# Patient Record
Sex: Female | Born: 1985 | Race: Black or African American | Hispanic: No | Marital: Single | Smoking: Never smoker
Health system: Southern US, Community
[De-identification: ages and names within clinical notes are randomized; demographics above are authoritative.]

## PROBLEM LIST (undated history)

## (undated) ENCOUNTER — Inpatient Hospital Stay: Payer: Self-pay

## (undated) ENCOUNTER — Inpatient Hospital Stay (HOSPITAL_COMMUNITY): Payer: Self-pay

## (undated) DIAGNOSIS — D649 Anemia, unspecified: Secondary | ICD-10-CM

## (undated) DIAGNOSIS — M419 Scoliosis, unspecified: Secondary | ICD-10-CM

## (undated) DIAGNOSIS — R011 Cardiac murmur, unspecified: Secondary | ICD-10-CM

## (undated) DIAGNOSIS — J45909 Unspecified asthma, uncomplicated: Secondary | ICD-10-CM

## (undated) HISTORY — PX: WISDOM TOOTH EXTRACTION: SHX21

## (undated) HISTORY — PX: DILATION AND CURETTAGE OF UTERUS: SHX78

---
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
## 2012-07-11 ENCOUNTER — Encounter (HOSPITAL_COMMUNITY): Payer: Self-pay | Admitting: *Deleted

## 2012-07-11 ENCOUNTER — Emergency Department (HOSPITAL_COMMUNITY)
Admission: EM | Admit: 2012-07-11 | Discharge: 2012-07-12 | Disposition: A | Discharge status: Home / Self Care | Payer: Medicaid Other | Attending: Emergency Medicine | Admitting: Emergency Medicine

## 2012-07-11 DIAGNOSIS — R1310 Dysphagia, unspecified: Secondary | ICD-10-CM | POA: Insufficient documentation

## 2012-07-11 DIAGNOSIS — J029 Acute pharyngitis, unspecified: Secondary | ICD-10-CM | POA: Insufficient documentation

## 2012-07-11 NOTE — ED Notes (Signed)
Pt c/o sore throat x 1 month; no other symptoms

## 2012-07-12 ENCOUNTER — Encounter (HOSPITAL_COMMUNITY): Payer: Self-pay

## 2012-07-12 ENCOUNTER — Emergency Department (HOSPITAL_COMMUNITY): Payer: Medicaid Other

## 2012-07-12 LAB — POCT I-STAT, CHEM 8
Chloride: 109 mEq/L (ref 96–112)
HCT: 40 % (ref 36.0–46.0)
Potassium: 3.7 mEq/L (ref 3.5–5.1)
Sodium: 142 mEq/L (ref 135–145)

## 2012-07-12 LAB — RAPID STREP SCREEN (MED CTR MEBANE ONLY): Streptococcus, Group A Screen (Direct): NEGATIVE

## 2012-07-12 MED ORDER — ACETAMINOPHEN 500 MG PO TABS
1000.0000 mg | ORAL_TABLET | Freq: Once | ORAL | Status: DC
  Filled 2012-07-12: qty 2

## 2012-07-12 MED ORDER — HYDROCODONE-ACETAMINOPHEN 5-325 MG PO TABS
2.0000 | ORAL_TABLET | Freq: Once | ORAL | Status: DC
  Filled 2012-07-12: qty 2

## 2012-07-12 MED ORDER — CETIRIZINE HCL 10 MG PO TABS: 10.0000 mg | ORAL_TABLET | Freq: Every day | ORAL | Status: DC

## 2012-07-12 MED ORDER — GUAIFENESIN ER 600 MG PO TB12: 1200.0000 mg | ORAL_TABLET | Freq: Two times a day (BID) | ORAL | Status: DC

## 2012-07-12 MED ORDER — IOHEXOL 300 MG/ML  SOLN
100.0000 mL | Freq: Once | INTRAMUSCULAR | Status: AC | PRN
  Administered 2012-07-12: 100 mL via INTRAVENOUS

## 2012-07-12 MED ORDER — ACETAMINOPHEN 160 MG/5ML PO SOLN
15.0000 mg/kg | Freq: Once | ORAL | Status: AC
  Administered 2012-07-12: 972.8 mg via ORAL
  Filled 2012-07-12: qty 40.6

## 2012-07-12 NOTE — ED Provider Notes (Signed)
Sarah Reese S 1:00 AM patient discussed in sign out with Sarah Reese.  Patient complaining of intermittent sore throat for the past one month. Patient also with history of seasonal allergies and occasional congestion. Patient receiving CAT scan to rule out postpharyngeal abscess, as well as strep tests and chem 8. Will await results and treat appropriately.  CT negative for abscess. Patient labs and strep test was also unremarkable. Discussed findings with patient. Patient has tongue piercing. Pharynx with slight cobblestoning. Tonsils normal. Uvula midline. No exudate present. No signs for PTA on clinical exam. At this time I discussed with patient symptomatic treatment. Only encouraged him once daily allergy medications as well as Zyrtec, Claritin or Allegra. I also discussed use of liquid children's Benadryl to help with sore throat and allergy. Patient  Instructed to continue salt water gargles as well as Tylenol and ibuprofen for pain. She agrees with plan and will begin trying these treatments. Patient given strict return precautions  Angus Seller, PA-C 07/12/12 [phone]

## 2012-07-12 NOTE — ED Provider Notes (Signed)
History     CSN: [id number]  Arrival date & time 07/11/12  2351   First MD Initiated Contact with Patient 07/12/12 0008      Chief Complaint  Patient presents with  . Sore Throat    (Consider location/radiation/quality/duration/timing/severity/associated sxs/prior treatment) HPI Comments: Presents today with her throat x2 days. She states it only hurts on her right side. She reports that it is difficult to swallow, open her mouth. She is tender on the right side of her neck. She reports that she has tried throat lozenges, Advil, Tylenol with no relief. She denies recent illness, fever, congestion, rhinorrhea, cough, nausea, vomiting, abdominal pain.   The history is provided by the patient. No language interpreter was used.    History reviewed. No pertinent past medical history.  History reviewed. No pertinent past surgical history.  No family history on file.  History  Substance Use Topics  . Smoking status: Never Smoker   . Smokeless tobacco: Not on file  . Alcohol Use: No    OB History   Grav Para Term Preterm Abortions TAB SAB Ect Mult Living                  Review of Systems  Constitutional: Negative for fever and chills.  HENT: Positive for sore throat and trouble swallowing. Negative for congestion, rhinorrhea and drooling.   Cardiovascular: Negative for chest pain.  Gastrointestinal: Negative for nausea, vomiting and abdominal pain.  All other systems reviewed and are negative.    Allergies  Review of patient's allergies indicates no known allergies.  Home Medications  No current outpatient prescriptions on file.  BP 129/50  Pulse 88  Temp(Src) 98.6 F (37 C)  Resp 20  SpO2 100%  Physical Exam  Nursing note and vitals reviewed. Constitutional: She is oriented to person, place, and time. She appears well-developed and well-nourished. No distress.  HENT:  Head: Normocephalic and atraumatic.  Right Ear: External ear normal.  Left Ear: External  ear normal.  Nose: Nose normal.  Mouth/Throat: Oropharynx is clear and moist.  Exquisitely tender to palpation over right submandibular area; mild trismus High riding tongue No submental edema  Eyes: Conjunctivae are normal.  Neck: Trachea normal, normal range of motion and phonation normal. No rigidity.  Cardiovascular: Normal rate, regular rhythm and normal heart sounds.   Pulmonary/Chest: Effort normal and breath sounds normal. No stridor. No respiratory distress. She has no wheezes. She has no rales.  Abdominal: Soft. She exhibits no distension. There is no tenderness.  Musculoskeletal: Normal range of motion.  Neurological: She is alert and oriented to person, place, and time. She has normal strength.  Skin: Skin is warm and dry. She is not diaphoretic. No erythema.  Psychiatric: She has a normal mood and affect. Her behavior is normal.    ED Course  Procedures (including critical care time)  Labs Reviewed  POCT I-STAT, CHEM 8 - Abnormal; Notable for the following:    Glucose, Bld 101 (*)    Calcium, Ion 1.26 (*)    All other components within normal limits  RAPID STREP SCREEN  CULTURE, GROUP A STREP   Ct Soft Tissue Neck W Contrast  07/12/2012   *RADIOLOGY REPORT*  Clinical Data: Sore throat.  CT NECK WITH CONTRAST  Technique:  Multidetector CT imaging of the neck was performed with intravenous contrast.  Contrast:  100 ml Omnipaque-300.  Comparison:  Findings: No neck mass or adenopathy.  No findings for abscess or significant inflammatory process. The  tonsils and adenoids are slightly prominent and mild edema in the tonsils may suggest inflammation but no abscess.  No abnormal prevertebral or retropharyngeal soft tissue swelling.  The epiglottis is normal.  The parotid and submandibular glands are unremarkable.  The thyroid gland is normal.  The major vascular structures are normal. Scattered lymph nodes but no adenopathy.  The tongue base and floor the mouth are grossly normal.   The piriform sinus and vallecular air spaces are normal.  The lung apices are clear.  IMPRESSION:  1.  Probable mild tonsillitis but no abscess. 2.  No neck mass or adenopathy.   Original Report Authenticated By: Rudie Meyer, M.D.     1. Pharyngitis       MDM  Patient presents with unilateral throat pain and trismus. High riding tongue made it difficult to visualize her oropharynx. CT neck was negative for abscess. Strep negative. This is likely viral. No signs of impending airway obstruction. Claritin and guaifenesin were given at discharge. Return instructions given. Vital signs stable for discharge. Patient / Family / Caregiver informed of clinical course, understand medical decision-making process, and agree with plan.         Mora Bellman, PA-C 07/12/12 1533

## 2012-07-12 NOTE — ED Notes (Signed)
Pt reports sore throat x1 month - denies any fever - states has been doing salt water gargles w/o relief.

## 2012-07-12 NOTE — ED Notes (Signed)
Pt ambulating independently w/ steady gait on d/c in no acute distress, A&Ox4. D/c instructions reviewed w/ pt and family - pt and family deny any further questions or concerns at present. Rx given x2  

## 2012-07-12 NOTE — ED Provider Notes (Signed)
Medical screening examination/treatment/procedure(s) were performed by non-physician practitioner and as supervising physician I was immediately available for consultation/collaboration.   Richardean Canal, MD 07/12/12 [phone]

## 2012-07-12 NOTE — ED Provider Notes (Signed)
Medical screening examination/treatment/procedure(s) were performed by non-physician practitioner and as supervising physician I was immediately available for consultation/collaboration.   Cadey Bazile J. Monseratt Ledin, MD 07/12/12 2322 

## 2012-07-13 LAB — CULTURE, GROUP A STREP

## 2012-07-15 ENCOUNTER — Telehealth (HOSPITAL_COMMUNITY): Payer: Self-pay | Admitting: Emergency Medicine

## 2012-07-15 NOTE — ED Notes (Signed)
Post ED Visit - Positive Culture Follow-up  Culture report reviewed by antimicrobial stewardship pharmacist: []  Wes Dulaney, Pharm.D., BCPS [x]  Celedonio Miyamoto, Pharm.D., BCPS []  Georgina Pillion, Pharm.D., BCPS []  [place], [place].D., BCPS, AAHIVP []  Estella Husk, Pharm.D., BCPS, AAHIV  Positive throat culture No treatment needed.  Kylie A Holland 07/15/2012, 11:09 AM

## 2012-08-09 ENCOUNTER — Encounter (HOSPITAL_BASED_OUTPATIENT_CLINIC_OR_DEPARTMENT_OTHER): Payer: Self-pay | Admitting: Student

## 2012-08-09 ENCOUNTER — Emergency Department (HOSPITAL_BASED_OUTPATIENT_CLINIC_OR_DEPARTMENT_OTHER): Payer: Medicaid Other

## 2012-08-09 ENCOUNTER — Emergency Department (HOSPITAL_BASED_OUTPATIENT_CLINIC_OR_DEPARTMENT_OTHER)
Admission: EM | Admit: 2012-08-09 | Discharge: 2012-08-09 | Disposition: A | Discharge status: Home / Self Care | Payer: Medicaid Other | Attending: Emergency Medicine | Admitting: Emergency Medicine

## 2012-08-09 DIAGNOSIS — R55 Syncope and collapse: Secondary | ICD-10-CM | POA: Insufficient documentation

## 2012-08-09 DIAGNOSIS — R42 Dizziness and giddiness: Secondary | ICD-10-CM | POA: Insufficient documentation

## 2012-08-09 DIAGNOSIS — Z3202 Encounter for pregnancy test, result negative: Secondary | ICD-10-CM | POA: Insufficient documentation

## 2012-08-09 LAB — URINALYSIS, ROUTINE W REFLEX MICROSCOPIC
Hgb urine dipstick: NEGATIVE
Ketones, ur: 15 mg/dL — AB
Specific Gravity, Urine: 1.036 — ABNORMAL HIGH (ref 1.005–1.030)
pH: 6 (ref 5.0–8.0)

## 2012-08-09 LAB — BASIC METABOLIC PANEL
BUN: 8 mg/dL (ref 6–23)
CO2: 22 mEq/L (ref 19–32)
Calcium: 9.5 mg/dL (ref 8.4–10.5)
Glucose, Bld: 113 mg/dL — ABNORMAL HIGH (ref 70–99)
Sodium: 140 mEq/L (ref 135–145)

## 2012-08-09 LAB — CBC WITH DIFFERENTIAL/PLATELET
Eosinophils Relative: 3 % (ref 0–5)
HCT: 35.2 % — ABNORMAL LOW (ref 36.0–46.0)
Hemoglobin: 12.4 g/dL (ref 12.0–15.0)
Lymphocytes Relative: 56 % — ABNORMAL HIGH (ref 12–46)
Lymphs Abs: 2.6 10*3/uL (ref 0.7–4.0)
MCV: 86.7 fL (ref 78.0–100.0)
Monocytes Absolute: 0.5 10*3/uL (ref 0.1–1.0)
Monocytes Relative: 10 % (ref 3–12)
Platelets: 224 10*3/uL (ref 150–400)
RBC: 4.06 MIL/uL (ref 3.87–5.11)
WBC: 4.7 10*3/uL (ref 4.0–10.5)

## 2012-08-09 LAB — URINE MICROSCOPIC-ADD ON

## 2012-08-09 MED ORDER — ONDANSETRON HCL 4 MG/2ML IJ SOLN
4.0000 mg | Freq: Once | INTRAMUSCULAR | Status: AC
  Administered 2012-08-09: 4 mg via INTRAVENOUS
  Filled 2012-08-09: qty 2

## 2012-08-09 NOTE — ED Provider Notes (Signed)
History     CSN: [id number]  Arrival date & time 08/09/12  1444   First MD Initiated Contact with Patient 08/09/12 1509      Chief Complaint  Patient presents with  . Loss of Consciousness    (Consider location/radiation/quality/duration/timing/severity/associated sxs/prior treatment) HPI Comments: Pt state that she urinated and then next thing she knew she was on the ambulance:pt states that she still feels a little dizzy:by standers report syncope with no seizure activity:pt states that she was very hot prior to the event  Patient is a 27 y.o. female presenting with syncope. The history is provided by the patient. No language interpreter was used.  Loss of Consciousness Episode history:  Single Most recent episode:  Today Timing:  Constant Progression:  Resolved Chronicity:  New Context: urination   Witnessed: yes   Relieved by:  Nothing Worsened by:  Nothing tried Associated symptoms: no chest pain, no confusion, no nausea and no vomiting   Risk factors: no seizures     History reviewed. No pertinent past medical history.  History reviewed. No pertinent past surgical history.  History reviewed. No pertinent family history.  History  Substance Use Topics  . Smoking status: Never Smoker   . Smokeless tobacco: Not on file  . Alcohol Use: No    OB History   Grav Para Term Preterm Abortions TAB SAB Ect Mult Living                  Review of Systems  Constitutional: Negative.   Respiratory: Negative.   Cardiovascular: Positive for syncope. Negative for chest pain.  Gastrointestinal: Negative for nausea and vomiting.  Psychiatric/Behavioral: Negative for confusion.    Allergies  Review of patient's allergies indicates no known allergies.  Home Medications  No current outpatient prescriptions on file.  BP 116/74  Pulse 81  Temp(Src) 98.2 F (36.8 C) (Oral)  Resp 20  Wt 125 lb (56.7 kg)  SpO2 100%  Physical Exam  Nursing note and vitals  reviewed. Constitutional: She is oriented to person, place, and time. She appears well-developed and well-nourished.  HENT:  Head: Normocephalic and atraumatic.  Eyes: Conjunctivae and EOM are normal. Pupils are equal, round, and reactive to light.  Neck: Normal range of motion. Neck supple.  Cardiovascular: Normal rate and regular rhythm.   Pulmonary/Chest: Effort normal and breath sounds normal.  Abdominal: Soft. Bowel sounds are normal.  Musculoskeletal: Normal range of motion.       Cervical back: Normal.       Thoracic back: Normal.       Lumbar back: Normal.  Neurological: She is alert and oriented to person, place, and time.  Skin: Skin is warm and dry.    ED Course  Procedures (including critical care time)  Labs Reviewed  URINALYSIS, ROUTINE W REFLEX MICROSCOPIC - Abnormal; Notable for the following:    Color, Urine AMBER (*)    Specific Gravity, Urine 1.036 (*)    Bilirubin Urine SMALL (*)    Ketones, ur 15 (*)    Protein, ur 30 (*)    Urobilinogen, UA 2.0 (*)    Leukocytes, UA TRACE (*)    All other components within normal limits  URINE MICROSCOPIC-ADD ON - Abnormal; Notable for the following:    Squamous Epithelial / LPF FEW (*)    Bacteria, UA FEW (*)    All other components within normal limits  PREGNANCY, URINE  CBC WITH DIFFERENTIAL  BASIC METABOLIC PANEL   Dg Chest 2  View  08/09/2012   *RADIOLOGY REPORT*  Clinical Data: Syncope  CHEST - 2 VIEW  Comparison: None.  Findings: Heart size and vascularity are normal.  Lungs are free of infiltrate or effusion.  There is thoracic scoliosis.  IMPRESSION: No acute cardiopulmonary abnormality.   Original Report Authenticated By: Janeece Riggers, M.D.    Date: 08/09/2012  Rate: 79  Rhythm: normal sinus rhythm  QRS Axis: right  Intervals: normal  ST/T Wave abnormalities: normal  Conduction Disutrbances:none  Narrative Interpretation:   Old EKG Reviewed: none available    1. Syncope       MDM  Pt is feeling  better at this time:ambulating without difficulty:pt is okay to go home       Teressa Lower, NP 08/09/12 1723

## 2012-08-09 NOTE — ED Notes (Signed)
Walked pt around ER without problem

## 2012-08-09 NOTE — ED Notes (Signed)
Pt in via EMS from work with report syncopal episode while standing at work where it is reported to be very hot. Per EMS pt was eased to floor and + LOC for 2-4 minutes. No prior HX of cardiac issues. Denies V D CP SOB. Ambulatory at scene, reports dizziness while standing and no seizure activity noted.

## 2012-08-09 NOTE — ED Provider Notes (Signed)
Medical screening examination/treatment/procedure(s) were performed by non-physician practitioner and as supervising physician I was immediately available for consultation/collaboration.   Gwyneth Sprout, MD 08/09/12 2300

## 2012-08-10 ENCOUNTER — Encounter (HOSPITAL_COMMUNITY): Payer: Self-pay

## 2012-09-16 ENCOUNTER — Emergency Department (HOSPITAL_BASED_OUTPATIENT_CLINIC_OR_DEPARTMENT_OTHER)
Admission: EM | Admit: 2012-09-16 | Discharge: 2012-09-16 | Disposition: A | Discharge status: Home / Self Care | Payer: Medicaid Other | Attending: Emergency Medicine | Admitting: Emergency Medicine

## 2012-09-16 ENCOUNTER — Encounter (HOSPITAL_BASED_OUTPATIENT_CLINIC_OR_DEPARTMENT_OTHER): Payer: Self-pay

## 2012-09-16 DIAGNOSIS — N949 Unspecified condition associated with female genital organs and menstrual cycle: Secondary | ICD-10-CM | POA: Insufficient documentation

## 2012-09-16 DIAGNOSIS — R110 Nausea: Secondary | ICD-10-CM | POA: Insufficient documentation

## 2012-09-16 DIAGNOSIS — R102 Pelvic and perineal pain: Secondary | ICD-10-CM

## 2012-09-16 DIAGNOSIS — Z79899 Other long term (current) drug therapy: Secondary | ICD-10-CM | POA: Insufficient documentation

## 2012-09-16 DIAGNOSIS — Z3202 Encounter for pregnancy test, result negative: Secondary | ICD-10-CM | POA: Insufficient documentation

## 2012-09-16 DIAGNOSIS — Z8742 Personal history of other diseases of the female genital tract: Secondary | ICD-10-CM | POA: Insufficient documentation

## 2012-09-16 LAB — WET PREP, GENITAL: Trich, Wet Prep: NONE SEEN

## 2012-09-16 LAB — URINALYSIS, ROUTINE W REFLEX MICROSCOPIC
Bilirubin Urine: NEGATIVE
Ketones, ur: NEGATIVE mg/dL
Nitrite: NEGATIVE
Urobilinogen, UA: 1 mg/dL (ref 0.0–1.0)

## 2012-09-16 MED ORDER — ONDANSETRON HCL 4 MG/2ML IJ SOLN: 4.0000 mg | Freq: Once | INTRAMUSCULAR | Status: DC

## 2012-09-16 MED ORDER — LORAZEPAM 2 MG/ML IJ SOLN: 1.0000 mg | Freq: Once | INTRAMUSCULAR | Status: DC

## 2012-09-16 MED ORDER — TRAMADOL HCL 50 MG PO TABS: 50.0000 mg | ORAL_TABLET | Freq: Four times a day (QID) | ORAL | Status: DC | PRN

## 2012-09-16 MED ORDER — SODIUM CHLORIDE 0.9 % IV BOLUS (SEPSIS): 1000.0000 mL | Freq: Once | INTRAVENOUS | Status: DC

## 2012-09-16 NOTE — ED Notes (Signed)
Patient here with left lower abdominal pain intermittently x 4 months. Her GYN informed her that she had ovarian cyst but the pain is increasing. Denies urinary symptoms. Reports some back pain and radiation to left leg

## 2012-09-16 NOTE — ED Provider Notes (Signed)
CSN: [id number]     Arrival date & time 09/16/12  1504 History  This chart was scribed for Rolan Bucco, MD by Bennett Scrape, ED Scribe. This patient was seen in room MH05/MH05 and the patient's care was started at 3:44 PM.   First MD Initiated Contact with Patient 09/16/12 1513     Chief Complaint  Patient presents with  . Abdominal Pain    Patient is a 27 y.o. female presenting with abdominal pain. The history is provided by the patient. No language interpreter was used.  Abdominal Pain This is a new problem. The current episode started more than 1 week ago. The problem occurs daily. The problem has been gradually worsening. Associated symptoms include abdominal pain. Pertinent negatives include no chest pain, no headaches and no shortness of breath.    HPI Comments: Sarah Reese is a 27 y.o. female who presents to the Emergency Department complaining of 4 months of non-radiating LLQ abdominal pain that has been gradually worsening since onset. She states that she feels the pain daily and reports that it sometimes lasts all day and sometimes is intermittent. She lists nausea as an associated symptom. She admits that she was diagnosed with an ovarian cyst as the cause of her pain by her OB-GYN 4 months ago but was told to "just watch" the symptoms. She denies difficulty urinating, dysuria, vaginal discharge or bleeding. Pt does not have a h/o chronic medical conditions. Pt denies smoking and alcohol use.   History reviewed. No pertinent past medical history.  History reviewed. No pertinent past surgical history.  No family history on file.  History  Substance Use Topics  . Smoking status: Never Smoker   . Smokeless tobacco: Not on file  . Alcohol Use: No   No OB history provided.    Review of Systems  Constitutional: Negative for fever, chills, diaphoresis and fatigue.  HENT: Negative for congestion, rhinorrhea and sneezing.   Eyes: Negative.   Respiratory: Negative for  cough, chest tightness and shortness of breath.   Cardiovascular: Negative for chest pain and leg swelling.  Gastrointestinal: Positive for nausea and abdominal pain. Negative for vomiting, diarrhea and blood in stool.  Genitourinary: Negative for frequency, hematuria, flank pain, vaginal bleeding, vaginal discharge and difficulty urinating.  Musculoskeletal: Negative for back pain and arthralgias.  Skin: Negative for rash.  Neurological: Negative for dizziness, speech difficulty, weakness, numbness and headaches.  All other systems reviewed and are negative.    Allergies  Review of patient's allergies indicates no known allergies.  Home Medications   Current Outpatient Rx  Name  Route  Sig  Dispense  Refill  . levonorgestrel-ethinyl estradiol (AMETHYST) 90-20 MCG tablet   Oral   Take 1 tablet by mouth every morning.         . traMADol (ULTRAM) 50 MG tablet   Oral   Take 1 tablet (50 mg total) by mouth every 6 (six) hours as needed for pain.   15 tablet   0    Triage Vitals: BP 124/76  Pulse 96  Temp(Src) 99.3 F (37.4 C) (Oral)  Resp 16  SpO2 100%  Physical Exam  Nursing note and vitals reviewed. Constitutional: She is oriented to person, place, and time. She appears well-developed and well-nourished.  Appears comfortable and is sitting up in bed  HENT:  Head: Normocephalic and atraumatic.  Eyes: Pupils are equal, round, and reactive to light.  Neck: Normal range of motion. Neck supple.  Cardiovascular: Normal rate, regular rhythm and  normal heart sounds.   Pulmonary/Chest: Effort normal and breath sounds normal. No respiratory distress. She has no wheezes. She has no rales. She exhibits no tenderness.  Abdominal: Soft. Bowel sounds are normal. There is tenderness (mild tenderness in the LLQ). There is no rebound, no guarding and no CVA tenderness.  Genitourinary:  Small amount of thick white discharge, no CMT or adnexal tenderness, chaperone present   Musculoskeletal: Normal range of motion. She exhibits no edema.  Lymphadenopathy:    She has no cervical adenopathy.  Neurological: She is alert and oriented to person, place, and time.  Skin: Skin is warm and dry. No rash noted.  Psychiatric: She has a normal mood and affect.    ED Course   Procedures (including critical care time)  DIAGNOSTIC STUDIES: Oxygen Saturation is 100% on room air, normal by my interpretation.    COORDINATION OF CARE: 3:47 PM-Discussed treatment plan which includes pelvic exam and UA with pt at bedside and pt agreed to plan.   Results for orders placed during the hospital encounter of 09/16/12  WET PREP, GENITAL      Result Value Range   Yeast Wet Prep HPF POC NONE SEEN  NONE SEEN   Trich, Wet Prep NONE SEEN  NONE SEEN   Clue Cells Wet Prep HPF POC NONE SEEN  NONE SEEN   WBC, Wet Prep HPF POC MANY (*) NONE SEEN  URINALYSIS, ROUTINE W REFLEX MICROSCOPIC      Result Value Range   Color, Urine YELLOW  YELLOW   APPearance CLEAR  CLEAR   Specific Gravity, Urine 1.020  1.005 - 1.030   pH 6.5  5.0 - 8.0   Glucose, UA NEGATIVE  NEGATIVE mg/dL   Hgb urine dipstick NEGATIVE  NEGATIVE   Bilirubin Urine NEGATIVE  NEGATIVE   Ketones, ur NEGATIVE  NEGATIVE mg/dL   Protein, ur NEGATIVE  NEGATIVE mg/dL   Urobilinogen, UA 1.0  0.0 - 1.0 mg/dL   Nitrite NEGATIVE  NEGATIVE   Leukocytes, UA NEGATIVE  NEGATIVE  PREGNANCY, URINE      Result Value Range   Preg Test, Ur NEGATIVE  NEGATIVE   No results found.   No results found. 1. Pelvic pain     MDM  Patient of pain consistent with her previously diagnosed ovarian cyst. She is smiling and very comfortable appearing. Her symptoms do not sound consistent ovarian torsion. She has no suggestions of PID. She was discharged home with a prescription for Ultram. She was given referral to followup with the women's outpatient clinic or another OB/GYN of her choice.  I personally performed the services described in  this documentation, which was scribed in my presence.  The recorded information has been reviewed and considered.    Rolan Bucco, MD 09/16/12 1705

## 2012-09-17 LAB — GC/CHLAMYDIA PROBE AMP
CT Probe RNA: NEGATIVE
GC Probe RNA: NEGATIVE

## 2013-01-13 ENCOUNTER — Emergency Department (HOSPITAL_BASED_OUTPATIENT_CLINIC_OR_DEPARTMENT_OTHER)
Admission: EM | Admit: 2013-01-13 | Discharge: 2013-01-13 | Disposition: A | Discharge status: Home / Self Care | Payer: Medicaid Other | Attending: Emergency Medicine | Admitting: Emergency Medicine

## 2013-01-13 ENCOUNTER — Encounter (HOSPITAL_BASED_OUTPATIENT_CLINIC_OR_DEPARTMENT_OTHER): Payer: Self-pay | Admitting: Emergency Medicine

## 2013-01-13 DIAGNOSIS — J45909 Unspecified asthma, uncomplicated: Secondary | ICD-10-CM | POA: Insufficient documentation

## 2013-01-13 DIAGNOSIS — N926 Irregular menstruation, unspecified: Secondary | ICD-10-CM | POA: Insufficient documentation

## 2013-01-13 DIAGNOSIS — R109 Unspecified abdominal pain: Secondary | ICD-10-CM

## 2013-01-13 DIAGNOSIS — Z3202 Encounter for pregnancy test, result negative: Secondary | ICD-10-CM | POA: Insufficient documentation

## 2013-01-13 HISTORY — DX: Unspecified asthma, uncomplicated: J45.909

## 2013-01-13 LAB — URINALYSIS, ROUTINE W REFLEX MICROSCOPIC
Bilirubin Urine: NEGATIVE
Glucose, UA: NEGATIVE mg/dL
Ketones, ur: NEGATIVE mg/dL
Nitrite: NEGATIVE
Urobilinogen, UA: 1 mg/dL (ref 0.0–1.0)
pH: 6 (ref 5.0–8.0)

## 2013-01-13 NOTE — ED Provider Notes (Signed)
CSN: [id number]     Arrival date & time 01/13/13  2136 History   First MD Initiated Contact with Patient 01/13/13 2159     Chief Complaint  Patient presents with  . Abdominal Pain   (Consider location/radiation/quality/duration/timing/severity/associated sxs/prior Treatment) Patient is a 27 y.o. female presenting with abdominal pain. The history is provided by the patient. No language interpreter was used.  Abdominal Pain Pain location:  RLQ and RUQ Pain quality: aching and dull   Pain severity:  Mild Associated symptoms: vaginal bleeding   Associated symptoms: no chills, no dysuria, no fever, no nausea, no vaginal discharge and no vomiting   Associated symptoms comment:  She reports lower abdominal discomfort that is mild. No vaginal discharge. She has had some vaginal spotting, lighter than a period. She is on oral contraceptives and missed that last of the previous pack. She was sexually active between then and now and was concerned about the possibility of pregnancy before starting a new pack.   Past Medical History  Diagnosis Date  . Asthma    History reviewed. No pertinent past surgical history. History reviewed. No pertinent family history. History  Substance Use Topics  . Smoking status: Never Smoker   . Smokeless tobacco: Not on file  . Alcohol Use: No   OB History   Grav Para Term Preterm Abortions TAB SAB Ect Mult Living                 Review of Systems  Constitutional: Negative for fever and chills.  Gastrointestinal: Positive for abdominal pain. Negative for nausea and vomiting.  Genitourinary: Positive for vaginal bleeding. Negative for dysuria and vaginal discharge.  Musculoskeletal: Negative.  Negative for myalgias.  Skin: Negative.     Allergies  Review of patient's allergies indicates no known allergies.  Home Medications   Current Outpatient Rx  Name  Route  Sig  Dispense  Refill  . levonorgestrel-ethinyl estradiol (AMETHYST) 90-20 MCG tablet  Oral   Take 1 tablet by mouth every morning.         . traMADol (ULTRAM) 50 MG tablet   Oral   Take 1 tablet (50 mg total) by mouth every 6 (six) hours as needed for pain.   15 tablet   0    BP 116/73  Pulse 87  Temp(Src) 98.6 F (37 C) (Oral)  Resp 18  Ht 5\' 5"  (1.651 m)  Wt 140 lb (63.504 kg)  BMI 23.30 kg/m2  SpO2 99% Physical Exam  Constitutional: She is oriented to person, place, and time. She appears well-developed and well-nourished.  Neck: Normal range of motion.  Pulmonary/Chest: Effort normal.  Abdominal: There is no tenderness. There is no rebound and no guarding.  Genitourinary:  Deferred - patient declined pelvic exam.  Musculoskeletal: Normal range of motion.  Neurological: She is alert and oriented to person, place, and time.  Skin: Skin is warm and dry.  Psychiatric: She has a normal mood and affect.    ED Course  Procedures (including critical care time) Labs Review Labs Reviewed  URINALYSIS, ROUTINE W REFLEX MICROSCOPIC  PREGNANCY, URINE   Imaging Review No results found.  EKG Interpretation   None       MDM  No diagnosis found. 1. Irregular vaginal bleeding 2. Lower abdominal discomfort.  She declines pelvic exam. Symptoms are consistent with missing regular doses of oral contraceptives. She is encouraged to follow up with her PCP or GYN prn, and to take birth control pills as prescribed.  Arnoldo Hooker, PA-C 01/13/13 2229

## 2013-01-13 NOTE — ED Notes (Addendum)
Pt report low abd pain on set  Several days pt reports having  Spotting  After missing her birthcontrol med and is concern she maybe pregnant . Pt request any labs or test results be given in private.

## 2013-01-15 NOTE — ED Provider Notes (Signed)
Medical screening examination/treatment/procedure(s) were performed by non-physician practitioner and as supervising physician I was immediately available for consultation/collaboration.  EKG Interpretation   None         Charles B. Bernette Mayers, MD 01/15/13 2017

## 2013-06-07 ENCOUNTER — Emergency Department (HOSPITAL_BASED_OUTPATIENT_CLINIC_OR_DEPARTMENT_OTHER)
Admission: EM | Admit: 2013-06-07 | Discharge: 2013-06-07 | Disposition: A | Discharge status: Home / Self Care | Payer: Medicaid Other | Attending: Emergency Medicine | Admitting: Emergency Medicine

## 2013-06-07 ENCOUNTER — Encounter (HOSPITAL_BASED_OUTPATIENT_CLINIC_OR_DEPARTMENT_OTHER): Payer: Self-pay | Admitting: Emergency Medicine

## 2013-06-07 DIAGNOSIS — J45909 Unspecified asthma, uncomplicated: Secondary | ICD-10-CM | POA: Insufficient documentation

## 2013-06-07 DIAGNOSIS — Z760 Encounter for issue of repeat prescription: Secondary | ICD-10-CM | POA: Insufficient documentation

## 2013-06-07 DIAGNOSIS — Z3202 Encounter for pregnancy test, result negative: Secondary | ICD-10-CM | POA: Insufficient documentation

## 2013-06-07 DIAGNOSIS — R1110 Vomiting, unspecified: Secondary | ICD-10-CM | POA: Insufficient documentation

## 2013-06-07 LAB — PREGNANCY, URINE: PREG TEST UR: NEGATIVE

## 2013-06-07 MED ORDER — LEVONORGESTREL-ETHINYL ESTRAD 90-20 MCG PO TABS: 1.0000 | ORAL_TABLET | Freq: Every day | ORAL | Status: DC

## 2013-06-07 MED ORDER — ONDANSETRON 8 MG PO TBDP
8.0000 mg | ORAL_TABLET | Freq: Once | ORAL | Status: AC
  Administered 2013-06-07: 8 mg via ORAL
  Filled 2013-06-07: qty 1

## 2013-06-07 NOTE — ED Notes (Signed)
Pt c/o nausea/ vomiting  x 1 day

## 2013-06-07 NOTE — Discharge Instructions (Signed)
Medication Refill, Emergency Department  We have refilled your medication today as a courtesy to you. It is best for your medical care, however, to take care of getting refills done through your primary caregiver's office. They have your records and can do a better job of follow-up than we can in the emergency department.  On maintenance medications, we often only prescribe enough medications to get you by until you are able to see your regular caregiver. This is a more expensive way to refill medications.  In the future, please plan for refills so that you will not have to use the emergency department for this.  Thank you for your help. Your help allows us to better take care of the daily emergencies that enter our department.  Document Released: 05/27/2003 Document Revised: 05/02/2011 Document Reviewed: 02/07/2005  [employer]® Patient Information ©2014 [employer].

## 2013-06-07 NOTE — ED Provider Notes (Signed)
CSN: [id number]     Arrival date & time 06/07/13  0051 History   None    Chief Complaint  Patient presents with  . Emesis     (Consider location/radiation/quality/duration/timing/severity/associated sxs/prior Treatment) Patient is a 28 y.o. female presenting with vomiting. The history is provided by the patient.  Emesis Severity:  Mild Duration:  1 day Timing:  Intermittent Quality:  Stomach contents Progression:  Unchanged Chronicity:  New Relieved by:  Nothing Worsened by:  Nothing tried Ineffective treatments:  None tried Associated symptoms: no abdominal pain and no diarrhea   Risk factors: no sick contacts   Patient reports this happens when she is out of her OCP and she has been out for 5 days  Past Medical History  Diagnosis Date  . Asthma    History reviewed. No pertinent past surgical history. History reviewed. No pertinent family history. History  Substance Use Topics  . Smoking status: Never Smoker   . Smokeless tobacco: Not on file  . Alcohol Use: No   OB History   Grav Para Term Preterm Abortions TAB SAB Ect Mult Living                 Review of Systems  Gastrointestinal: Positive for vomiting. Negative for abdominal pain and diarrhea.  All other systems reviewed and are negative.     Allergies  Review of patient's allergies indicates no known allergies.  Home Medications   Prior to Admission medications   Medication Sig Start Date End Date Taking? Authorizing Provider  levonorgestrel-ethinyl estradiol (AMETHYST) 90-20 MCG tablet Take 1 tablet by mouth every morning.    Historical Provider, MD  levonorgestrel-ethinyl estradiol (AMETHYST) 90-20 MCG tablet Take 1 tablet by mouth daily. 06/07/13   Jimy Gates K Betina Puckett-Rasch, MD  traMADol (ULTRAM) 50 MG tablet Take 1 tablet (50 mg total) by mouth every 6 (six) hours as needed for pain. 09/16/12   Malvin Johns, MD   BP 124/88  Pulse 66  Temp(Src) 98.6 F (37 C) (Oral)  Resp 18  Ht 5\' 5"  (1.651 m)   Wt 141 lb (63.957 kg)  BMI 23.46 kg/m2  SpO2 100%  LMP 06/07/2013 Physical Exam  Constitutional: She is oriented to person, place, and time. She appears well-developed and well-nourished. No distress.  HENT:  Head: Normocephalic and atraumatic.  Mouth/Throat: Oropharynx is clear and moist.  Eyes: Conjunctivae are normal. Pupils are equal, round, and reactive to light.  Neck: Normal range of motion. Neck supple.  Cardiovascular: Normal rate, regular rhythm and intact distal pulses.   Pulmonary/Chest: Effort normal and breath sounds normal. She has no wheezes. She has no rales.  Abdominal: Soft. Bowel sounds are normal. There is no tenderness. There is no rebound and no guarding.  Musculoskeletal: Normal range of motion.  Neurological: She is alert and oriented to person, place, and time.  Skin: Skin is warm and dry.  Psychiatric: Thought content normal.    ED Course  Procedures (including critical care time) Labs Review Labs Reviewed  PREGNANCY, URINE    Imaging Review No results found.   EKG Interpretation None      MDM   Final diagnoses:  Vomiting  Medication refill    Patient styates she needs a refill on her OCP and she always gets it here.  Will refill x 1    Tico Crotteau K Ivyanna Sibert-Rasch, MD 06/07/13 [phone]

## 2013-06-07 NOTE — ED Notes (Signed)
MD at bedside. 

## 2014-05-03 ENCOUNTER — Emergency Department: Payer: Self-pay | Admitting: Emergency Medicine

## 2014-08-01 IMAGING — CR DG CHEST 2V
2 series · 2 of 2 positions shown · non-contrast
Comparison: None.

CLINICAL DATA: Syncope

CHEST - 2 VIEW

[w chest pa]
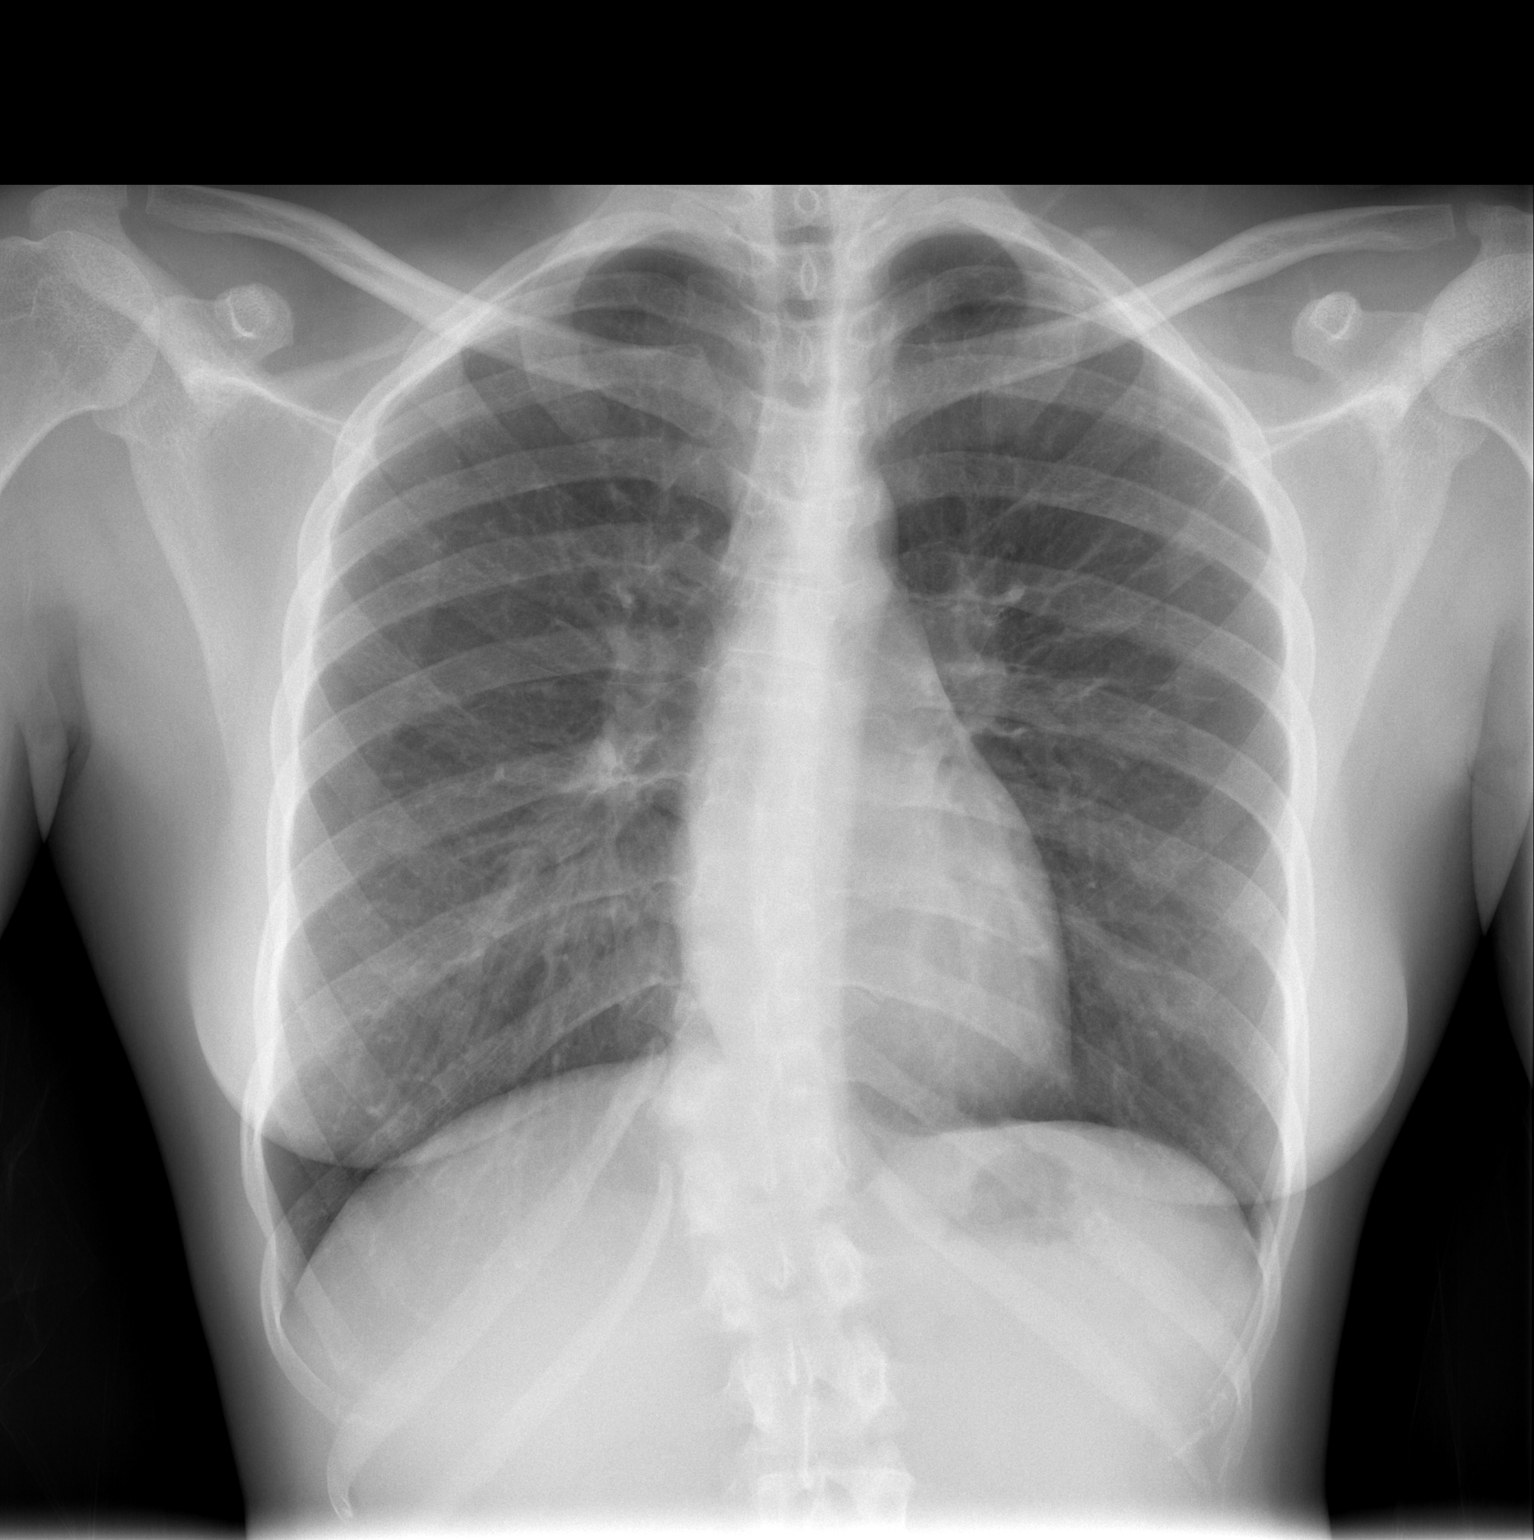

[w chest lat]
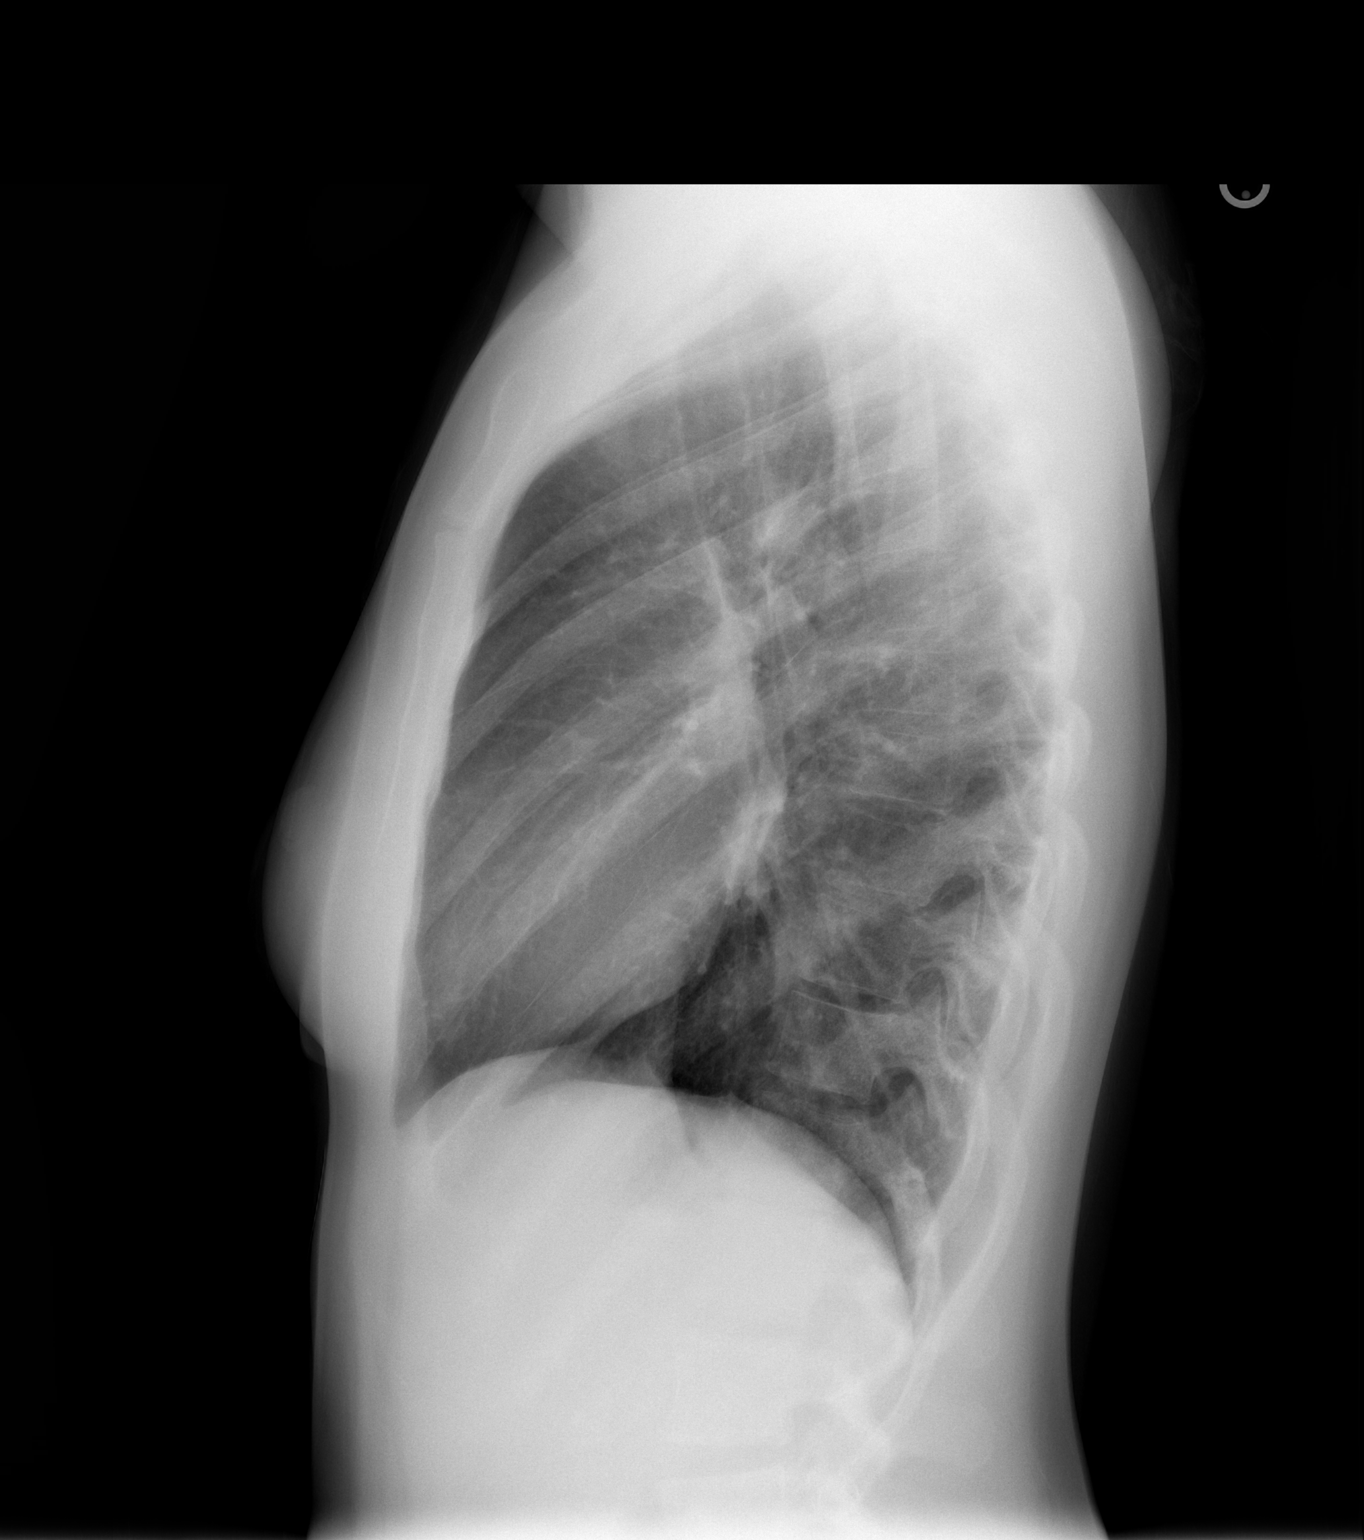

[2 of 2 positions shown; findings below may reference images not displayed]

FINDINGS: Heart size and vascularity are normal.  Lungs are free of
infiltrate or effusion.  There is thoracic scoliosis.
IMPRESSION: No acute cardiopulmonary abnormality.

## 2015-08-14 ENCOUNTER — Other Ambulatory Visit: Payer: Self-pay

## 2015-08-14 ENCOUNTER — Encounter (HOSPITAL_COMMUNITY): Payer: Self-pay | Admitting: Emergency Medicine

## 2015-08-14 DIAGNOSIS — R0789 Other chest pain: Secondary | ICD-10-CM | POA: Insufficient documentation

## 2015-08-14 DIAGNOSIS — Z79899 Other long term (current) drug therapy: Secondary | ICD-10-CM | POA: Insufficient documentation

## 2015-08-14 DIAGNOSIS — J029 Acute pharyngitis, unspecified: Secondary | ICD-10-CM | POA: Insufficient documentation

## 2015-08-14 DIAGNOSIS — H109 Unspecified conjunctivitis: Secondary | ICD-10-CM | POA: Insufficient documentation

## 2015-08-14 DIAGNOSIS — J45909 Unspecified asthma, uncomplicated: Secondary | ICD-10-CM | POA: Insufficient documentation

## 2015-08-14 NOTE — ED Notes (Signed)
Patient arrives complaining of sore throat, chest pain, and bilateral eye drainage and redness. Onset of chest pain was today. Onset of sore throat was about 4 days ago.

## 2015-08-15 ENCOUNTER — Emergency Department (HOSPITAL_COMMUNITY)
Admission: EM | Admit: 2015-08-15 | Discharge: 2015-08-15 | Disposition: A | Discharge status: Home / Self Care | Payer: Medicaid Other | Attending: Emergency Medicine | Admitting: Emergency Medicine

## 2015-08-15 ENCOUNTER — Emergency Department (HOSPITAL_COMMUNITY): Payer: Medicaid Other

## 2015-08-15 DIAGNOSIS — H109 Unspecified conjunctivitis: Secondary | ICD-10-CM

## 2015-08-15 DIAGNOSIS — R0789 Other chest pain: Secondary | ICD-10-CM

## 2015-08-15 DIAGNOSIS — J029 Acute pharyngitis, unspecified: Secondary | ICD-10-CM

## 2015-08-15 LAB — I-STAT TROPONIN, ED: TROPONIN I, POC: 0 ng/mL (ref 0.00–0.08)

## 2015-08-15 LAB — CBC
HCT: 35.5 % — ABNORMAL LOW (ref 36.0–46.0)
Hemoglobin: 11.6 g/dL — ABNORMAL LOW (ref 12.0–15.0)
MCH: 28.7 pg (ref 26.0–34.0)
MCHC: 32.7 g/dL (ref 30.0–36.0)
MCV: 87.9 fL (ref 78.0–100.0)
Platelets: 261 10*3/uL (ref 150–400)
RBC: 4.04 MIL/uL (ref 3.87–5.11)
RDW: 12.4 % (ref 11.5–15.5)
WBC: 6.5 10*3/uL (ref 4.0–10.5)

## 2015-08-15 LAB — BASIC METABOLIC PANEL
Anion gap: 6 (ref 5–15)
BUN: 7 mg/dL (ref 6–20)
CALCIUM: 9.5 mg/dL (ref 8.9–10.3)
CO2: 25 mmol/L (ref 22–32)
Chloride: 106 mmol/L (ref 101–111)
Creatinine, Ser: 0.81 mg/dL (ref 0.44–1.00)
GFR calc non Af Amer: >60 mL/min (ref >60)
GLUCOSE: 92 mg/dL (ref 65–99)
Potassium: 3.7 mmol/L (ref 3.5–5.1)
SODIUM: 137 mmol/L (ref 135–145)

## 2015-08-15 LAB — RAPID STREP SCREEN (MED CTR MEBANE ONLY): STREPTOCOCCUS, GROUP A SCREEN (DIRECT): NEGATIVE

## 2015-08-15 LAB — POC URINE PREG, ED: Preg Test, Ur: NEGATIVE

## 2015-08-15 MED ORDER — IBUPROFEN 800 MG PO TABS: 800.0000 mg | ORAL_TABLET | Freq: Three times a day (TID) | ORAL | Status: DC | PRN

## 2015-08-15 MED ORDER — KETOROLAC TROMETHAMINE 30 MG/ML IJ SOLN
60.0000 mg | Freq: Once | INTRAMUSCULAR | Status: AC
  Administered 2015-08-15: 60 mg via INTRAMUSCULAR
  Filled 2015-08-15: qty 2

## 2015-08-15 MED ORDER — POLYMYXIN B-TRIMETHOPRIM 10000-0.1 UNIT/ML-% OP SOLN: 2.0000 [drp] | OPHTHALMIC | Status: DC

## 2015-08-15 NOTE — ED Provider Notes (Signed)
TIME SEEN: 5:00 AM  CHIEF COMPLAINT: Multiple complaints  HPI: Pt is a 30 y.o. female with history of asthma who presents to the emergency department with multiple different complaints. States that she has been having bilateral eye redness and drainage for the past 4 days as well as sore throat. Pain is worse with swallowing. She is able to swallow and has no changes in her voice. No documented fever but has had chills. No nausea, vomiting or diarrhea. Developed chest pain while in the waiting room. Describes as a soreness is worse with movement. No shortness of breath. No cough. Denies vision changes or eye pain.  ROS: See HPI Constitutional: no fever  Eyes: no drainage  ENT: no runny nose   Cardiovascular:  chest pain  Resp: no SOB  GI: no vomiting GU: no dysuria Integumentary: no rash  Allergy: no hives  Musculoskeletal: no leg swelling  Neurological: no slurred speech ROS otherwise negative  PAST MEDICAL HISTORY/PAST SURGICAL HISTORY:  Past Medical History  Diagnosis Date  . Asthma     MEDICATIONS:  Prior to Admission medications   Medication Sig Start Date End Date Taking? Authorizing Provider  albuterol (PROVENTIL HFA;VENTOLIN HFA) 108 (90 Base) MCG/ACT inhaler Inhale into the lungs every 6 (six) hours as needed for wheezing or shortness of breath.   Yes Historical Provider, MD    ALLERGIES:  No Known Allergies  SOCIAL HISTORY:  Social History  Substance Use Topics  . Smoking status: Never Smoker   . Smokeless tobacco: Not on file  . Alcohol Use: No    FAMILY HISTORY: History reviewed. No pertinent family history.  EXAM: BP 108/68 mmHg  Pulse 77  Temp(Src) 97.9 F (36.6 C) (Oral)  Resp 20  Ht 5\' 5"  (1.651 m)  Wt 142 lb (64.411 kg)  BMI 23.63 kg/m2  SpO2 99%  LMP 07/27/2015 (Exact Date) CONSTITUTIONAL: Alert and oriented and responds appropriately to questions. Well-appearing; well-nourished HEAD: Normocephalic EYES: Conjunctivae are injected  bilaterally without drainage, pupils are equal reactive bilaterally, extraocular movements intact, ENT: normal nose; no rhinorrhea; moist mucous membranes; No pharyngeal erythema or petechiae, no tonsillar hypertrophy or exudate, no uvular deviation, no trismus or drooling, normal phonation, no stridor, no dental caries or abscess noted, no Ludwig's angina, tongue sits flat in the bottom of the mouth NECK: Supple, no meningismus, no LAD  CARD: RRR; S1 and S2 appreciated; no murmurs, no clicks, no rubs, no gallops RESP: Normal chest excursion without splinting or tachypnea; breath sounds clear and equal bilaterally; no wheezes, no rhonchi, no rales, no hypoxia or respiratory distress, speaking full sentences ABD/GI: Normal bowel sounds; non-distended; soft, non-tender, no rebound, no guarding, no peritoneal signs BACK:  The back appears normal and is non-tender to palpation, there is no CVA tenderness EXT: Normal ROM in all joints; non-tender to palpation; no edema; normal capillary refill; no cyanosis, no calf tenderness or swelling    SKIN: Normal color for age and race; warm; no rash NEURO: Moves all extremities equally, sensation to light touch intact diffusely, cranial nerves II through XII intact PSYCH: The patient's mood and manner are appropriate. Grooming and personal hygiene are appropriate.  MEDICAL DECISION MAKING: Patient here with multiple symptoms. Suspect viral illness. She has bilateral conjunctivitis and sore throat. No rash, strawberry tongue, desquamation, blisters, fever to suggest atypical Kawasaki's. We'll discharge with prescription for Polytrim drops to take for her conjunctivitis. His strep test is negative. No sign of deep space neck infection, or tonsillar abscess. Labs ordered  in triage are completely unremarkable including a negative troponin. Chest x-ray is clear. EKG shows no ischemic abdomen. Pregnancy test is negative. Patient states she's been trying Tylenol IV present  at home without relief. Have recommended she continue these medications, salt water gargles and Chloraseptic Spray. Patient is very rude, upset that we are not providing her with further medication for her sore throat. Discussed with her that I do not feel narcotics are indicated. We'll give IM Toradol. Discussed with her return precautions. I do not feel there is any life-threatening illness present.     EKG Interpretation  Date/Time:  Friday August 14 2015 23:21:30 EDT Ventricular Rate:  75 PR Interval:  178 QRS Duration: 82 QT Interval:  376 QTC Calculation: 419 R Axis:   86 Text Interpretation:  Normal sinus rhythm Normal ECG No old tracing to compare Confirmed by WARD,  DO, KRISTEN ([id number]) on 08/15/2015 6:05:01 AM        At this time, I do not feel there is any life-threatening condition present. I have reviewed and discussed all results (EKG, imaging, lab, urine as appropriate), exam findings with patient. I have reviewed nursing notes and appropriate previous records.  I feel the patient is safe to be discharged home without further emergent workup. Discussed usual and customary return precautions. Patient and family (if present) verbalize understanding and are comfortable with this plan.  Patient will follow-up with their primary care provider. If they do not have a primary care provider, information for follow-up has been provided to them. All questions have been answered.      [place], DO 08/15/15 [phone]

## 2015-08-15 NOTE — Discharge Instructions (Signed)
Your labs, chest x-ray, EKG, strep test and pregnancy test were negative today. You likely have a viruses causing your symptoms. Antibiotics will not help with a viral illness. I recommended alternating Tylenol 1000 mg every 6 hours as needed for fever and pain and ibuprofen 800 mg every 8 hours as needed for fever and pain. These are the strongest pain medications we have for sore throat. We do not provide narcotics for sore throat. You may use over-the-counter Chloraseptic spray. There is nothing to suggest any surgical emergency. I feel you can be discharged home. You may use salt water gargles to help with your sore throat. Please follow-up with your primary care doctor.   Bacterial Conjunctivitis Bacterial conjunctivitis, commonly called pink eye, is an inflammation of the clear membrane that covers the white part of the eye (conjunctiva). The inflammation can also happen on the underside of the eyelids. The blood vessels in the conjunctiva become inflamed, causing the eye to become red or pink. Bacterial conjunctivitis may spread easily from one eye to another and from person to person (contagious).  CAUSES  Bacterial conjunctivitis is caused by bacteria. The bacteria may come from your own skin, your upper respiratory tract, or from someone else with bacterial conjunctivitis. SYMPTOMS  The normally white color of the eye or the underside of the eyelid is usually pink or red. The pink eye is usually associated with irritation, tearing, and some sensitivity to light. Bacterial conjunctivitis is often associated with a thick, yellowish discharge from the eye. The discharge may turn into a crust on the eyelids overnight, which causes your eyelids to stick together. If a discharge is present, there may also be some blurred vision in the affected eye. DIAGNOSIS  Bacterial conjunctivitis is diagnosed by your caregiver through an eye exam and the symptoms that you report. Your caregiver looks for changes in  the surface tissues of your eyes, which may point to the specific type of conjunctivitis. A sample of any discharge may be collected on a cotton-tip swab if you have a severe case of conjunctivitis, if your cornea is affected, or if you keep getting repeat infections that do not respond to treatment. The sample will be sent to a lab to see if the inflammation is caused by a bacterial infection and to see if the infection will respond to antibiotic medicines. TREATMENT   Bacterial conjunctivitis is treated with antibiotics. Antibiotic eyedrops are most often used. However, antibiotic ointments are also available. Antibiotics pills are sometimes used. Artificial tears or eye washes may ease discomfort. HOME CARE INSTRUCTIONS   To ease discomfort, apply a cool, clean washcloth to your eye for 10-20 minutes, 3-4 times a day.  Gently wipe away any drainage from your eye with a warm, wet washcloth or a cotton ball.  Wash your hands often with soap and water. Use paper towels to dry your hands.  Do not share towels or washcloths. This may spread the infection.  Change or wash your pillowcase every day.  You should not use eye makeup until the infection is gone.  Do not operate machinery or drive if your vision is blurred.  Stop using contact lenses. Ask your caregiver how to sterilize or replace your contacts before using them again. This depends on the type of contact lenses that you use.  When applying medicine to the infected eye, do not touch the edge of your eyelid with the eyedrop bottle or ointment tube. SEEK IMMEDIATE MEDICAL CARE IF:   Your infection  has not improved within 3 days after beginning treatment.  You had yellow discharge from your eye and it returns.  You have increased eye pain.  Your eye redness is spreading.  Your vision becomes blurred.  You have a fever or persistent symptoms for more than 2-3 days.  You have a fever and your symptoms suddenly get worse.  You  have facial pain, redness, or swelling. MAKE SURE YOU:   Understand these instructions.  Will watch your condition.  Will get help right away if you are not doing well or get worse.   This information is not intended to replace advice given to you by your health care provider. Make sure you discuss any questions you have with your health care provider.   Document Released: 02/07/2005 Document Revised: 02/28/2014 Document Reviewed: 07/11/2011 Elsevier Interactive Patient Education 2016 Elsevier Inc.  Chest Wall Pain Chest wall pain is pain in or around the bones and muscles of your chest. Sometimes, an injury causes this pain. Sometimes, the cause may not be known. This pain may take several weeks or longer to get better. HOME CARE INSTRUCTIONS  Pay attention to any changes in your symptoms. Take these actions to help with your pain:   Rest as told by your health care provider.   Avoid activities that cause pain. These include any activities that use your chest muscles or your abdominal and side muscles to lift heavy items.   If directed, apply ice to the painful area:  Put ice in a plastic bag.  Place a towel between your skin and the bag.  Leave the ice on for 20 minutes, 2-3 times per day.  Take over-the-counter and prescription medicines only as told by your health care provider.  Do not use tobacco products, including cigarettes, chewing tobacco, and e-cigarettes. If you need help quitting, ask your health care provider.  Keep all follow-up visits as told by your health care provider. This is important. SEEK MEDICAL CARE IF:  You have a fever.  Your chest pain becomes worse.  You have new symptoms. SEEK IMMEDIATE MEDICAL CARE IF:  You have nausea or vomiting.  You feel sweaty or light-headed.  You have a cough with phlegm (sputum) or you cough up blood.  You develop shortness of breath.   This information is not intended to replace advice given to you by  your health care provider. Make sure you discuss any questions you have with your health care provider.   Document Released: 02/07/2005 Document Revised: 10/29/2014 Document Reviewed: 05/05/2014 Elsevier Interactive Patient Education 2016 Elsevier Inc.  Pharyngitis Pharyngitis is redness, pain, and swelling (inflammation) of your pharynx.  CAUSES  Pharyngitis is usually caused by infection. Most of the time, these infections are from viruses (viral) and are part of a cold. However, sometimes pharyngitis is caused by bacteria (bacterial). Pharyngitis can also be caused by allergies. Viral pharyngitis may be spread from person to person by coughing, sneezing, and personal items or utensils (cups, forks, spoons, toothbrushes). Bacterial pharyngitis may be spread from person to person by more intimate contact, such as kissing.  SIGNS AND SYMPTOMS  Symptoms of pharyngitis include:   Sore throat.   Tiredness (fatigue).   Low-grade fever.   Headache.  Joint pain and muscle aches.  Skin rashes.  Swollen lymph nodes.  Plaque-like film on throat or tonsils (often seen with bacterial pharyngitis). DIAGNOSIS  Your health care provider will ask you questions about your illness and your symptoms. Your medical history, along  with a physical exam, is often all that is needed to diagnose pharyngitis. Sometimes, a rapid strep test is done. Other lab tests may also be done, depending on the suspected cause.  TREATMENT  Viral pharyngitis will usually get better in 3-4 days without the use of medicine. Bacterial pharyngitis is treated with medicines that kill germs (antibiotics).  HOME CARE INSTRUCTIONS   Drink enough water and fluids to keep your urine clear or pale yellow.   Only take over-the-counter or prescription medicines as directed by your health care provider:   If you are prescribed antibiotics, make sure you finish them even if you start to feel better.   Do not take aspirin.    Get lots of rest.   Gargle with 8 oz of salt water ( tsp of salt per 1 qt of water) as often as every 1-2 hours to soothe your throat.   Throat lozenges (if you are not at risk for choking) or sprays may be used to soothe your throat. SEEK MEDICAL CARE IF:   You have large, tender lumps in your neck.  You have a rash.  You cough up green, yellow-brown, or bloody spit. SEEK IMMEDIATE MEDICAL CARE IF:   Your neck becomes stiff.  You drool or are unable to swallow liquids.  You vomit or are unable to keep medicines or liquids down.  You have severe pain that does not go away with the use of recommended medicines.  You have trouble breathing (not caused by a stuffy nose). MAKE SURE YOU:   Understand these instructions.  Will watch your condition.  Will get help right away if you are not doing well or get worse.   This information is not intended to replace advice given to you by your health care provider. Make sure you discuss any questions you have with your health care provider.   Document Released: 02/07/2005 Document Revised: 11/28/2012 Document Reviewed: 10/15/2012 Elsevier Interactive Patient Education [employer].    To find a primary care or specialty doctor please call [phone] or [phone] to access "[place] [facility] Doctor Service."  You may also go on the [facility] website at [email or web address]  There are also multiple [facility], [facility] and [facility] practices throughout the [place] that are frequently accepting new patients. You may find a clinic that is close to your home and contact them.  [facility] -  [address] [place] [phone]  [facility] in [place] (also locations in [place] and [place]) -  [place] [address] [address]  [place]  [phone]

## 2015-08-17 LAB — CULTURE, GROUP A STREP (THRC)

## 2015-10-20 ENCOUNTER — Encounter (HOSPITAL_COMMUNITY): Payer: Self-pay

## 2015-10-20 ENCOUNTER — Inpatient Hospital Stay (HOSPITAL_COMMUNITY): Payer: Medicaid Other

## 2015-10-20 ENCOUNTER — Inpatient Hospital Stay (HOSPITAL_COMMUNITY)
Admission: AD | Admit: 2015-10-20 | Discharge: 2015-10-20 | Disposition: A | Discharge status: Home / Self Care | Payer: Medicaid Other | Source: Ambulatory Visit | Attending: Obstetrics and Gynecology | Admitting: Obstetrics and Gynecology

## 2015-10-20 DIAGNOSIS — O26899 Other specified pregnancy related conditions, unspecified trimester: Secondary | ICD-10-CM

## 2015-10-20 DIAGNOSIS — R102 Pelvic and perineal pain: Secondary | ICD-10-CM

## 2015-10-20 DIAGNOSIS — B9689 Other specified bacterial agents as the cause of diseases classified elsewhere: Secondary | ICD-10-CM | POA: Diagnosis not present

## 2015-10-20 DIAGNOSIS — J45909 Unspecified asthma, uncomplicated: Secondary | ICD-10-CM | POA: Insufficient documentation

## 2015-10-20 DIAGNOSIS — R109 Unspecified abdominal pain: Secondary | ICD-10-CM | POA: Diagnosis present

## 2015-10-20 DIAGNOSIS — O99511 Diseases of the respiratory system complicating pregnancy, first trimester: Secondary | ICD-10-CM | POA: Diagnosis not present

## 2015-10-20 DIAGNOSIS — Z8249 Family history of ischemic heart disease and other diseases of the circulatory system: Secondary | ICD-10-CM | POA: Diagnosis not present

## 2015-10-20 DIAGNOSIS — N760 Acute vaginitis: Secondary | ICD-10-CM | POA: Insufficient documentation

## 2015-10-20 DIAGNOSIS — A499 Bacterial infection, unspecified: Secondary | ICD-10-CM

## 2015-10-20 DIAGNOSIS — Z3A01 Less than 8 weeks gestation of pregnancy: Secondary | ICD-10-CM | POA: Insufficient documentation

## 2015-10-20 DIAGNOSIS — O23591 Infection of other part of genital tract in pregnancy, first trimester: Secondary | ICD-10-CM | POA: Diagnosis not present

## 2015-10-20 DIAGNOSIS — Z3490 Encounter for supervision of normal pregnancy, unspecified, unspecified trimester: Secondary | ICD-10-CM

## 2015-10-20 LAB — WET PREP, GENITAL
Sperm: NONE SEEN
TRICH WET PREP: NONE SEEN
YEAST WET PREP: NONE SEEN

## 2015-10-20 LAB — URINALYSIS, ROUTINE W REFLEX MICROSCOPIC
BILIRUBIN URINE: NEGATIVE
Glucose, UA: NEGATIVE mg/dL
Hgb urine dipstick: NEGATIVE
Ketones, ur: NEGATIVE mg/dL
LEUKOCYTES UA: NEGATIVE
NITRITE: NEGATIVE
PROTEIN: NEGATIVE mg/dL
Specific Gravity, Urine: 1.01 (ref 1.005–1.030)
pH: 7.5 (ref 5.0–8.0)

## 2015-10-20 LAB — HCG, QUANTITATIVE, PREGNANCY: HCG, BETA CHAIN, QUANT, S: 1443 m[IU]/mL — AB (ref <5)

## 2015-10-20 LAB — POCT PREGNANCY, URINE: Preg Test, Ur: POSITIVE — AB

## 2015-10-20 MED ORDER — METRONIDAZOLE 500 MG PO TABS: 500.0000 mg | ORAL_TABLET | Freq: Two times a day (BID) | ORAL | 0 refills | Status: DC

## 2015-10-20 MED ORDER — METRONIDAZOLE 0.75 % VA GEL: 1.0000 | Freq: Every day | VAGINAL | 0 refills | Status: AC

## 2015-10-20 NOTE — Discharge Instructions (Signed)
First Trimester of Pregnancy  The first trimester of pregnancy is from week 1 until the end of week 12 (months 1 through 3). A week after a sperm fertilizes an egg, the egg will implant on the wall of the uterus. This embryo will begin to develop into a baby. Genes from you and your partner are forming the baby. The female genes determine whether the baby is a boy or a girl. At 6-8 weeks, the eyes and face are formed, and the heartbeat can be seen on ultrasound. At the end of 12 weeks, all the baby's organs are formed.   Now that you are pregnant, you will want to do everything you can to have a healthy baby. Two of the most important things are to get good prenatal care and to follow your health care provider's instructions. Prenatal care is all the medical care you receive before the baby's birth. This care will help prevent, find, and treat any problems during the pregnancy and childbirth.  BODY CHANGES  Your body goes through many changes during pregnancy. The changes vary from woman to woman.   · You may gain or lose a couple of pounds at first.  · You may feel sick to your stomach (nauseous) and throw up (vomit). If the vomiting is uncontrollable, call your health care provider.  · You may tire easily.  · You may develop headaches that can be relieved by medicines approved by your health care provider.  · You may urinate more often. Painful urination may mean you have a bladder infection.  · You may develop heartburn as a result of your pregnancy.  · You may develop constipation because certain hormones are causing the muscles that push waste through your intestines to slow down.  · You may develop hemorrhoids or swollen, bulging veins (varicose veins).  · Your breasts may begin to grow larger and become tender. Your nipples may stick out more, and the tissue that surrounds them (areola) may become darker.  · Your gums may bleed and may be sensitive to brushing and flossing.   · Dark spots or blotches (chloasma, mask of pregnancy) may develop on your face. This will likely fade after the baby is born.  · Your menstrual periods will stop.  · You may have a loss of appetite.  · You may develop cravings for certain kinds of food.  · You may have changes in your emotions from day to day, such as being excited to be pregnant or being concerned that something may go wrong with the pregnancy and baby.  · You may have more vivid and strange dreams.  · You may have changes in your hair. These can include thickening of your hair, rapid growth, and changes in texture. Some women also have hair loss during or after pregnancy, or hair that feels dry or thin. Your hair will most likely return to normal after your baby is born.  WHAT TO EXPECT AT YOUR PRENATAL VISITS  During a routine prenatal visit:  · You will be weighed to make sure you and the baby are growing normally.  · Your blood pressure will be taken.  · Your abdomen will be measured to track your baby's growth.  · The fetal heartbeat will be listened to starting around week 10 or 12 of your pregnancy.  · Test results from any previous visits will be discussed.  Your health care provider may ask you:  · How you are feeling.  · If you   including cigarettes, chewing tobacco, and electronic cigarettes.  If you have any questions. Other tests that may be performed during your first trimester include:  Blood tests to find your blood type and to check for the presence of any previous infections. They will also be used to check for low iron levels (anemia) and Rh antibodies. Later in the pregnancy, blood tests for diabetes will be done along with other tests if problems develop.  Urine tests to check for infections, diabetes, or protein in the urine.  An ultrasound to confirm the proper growth  and development of the baby.  An amniocentesis to check for possible genetic problems.  Fetal screens for spina bifida and Down syndrome.  You may need other tests to make sure you and the baby are doing well.  HIV (human immunodeficiency virus) testing. Routine prenatal testing includes screening for HIV, unless you choose not to have this test. HOME CARE INSTRUCTIONS  Medicines  Follow your health care provider's instructions regarding medicine use. Specific medicines may be either safe or unsafe to take during pregnancy.  Take your prenatal vitamins as directed.  If you develop constipation, try taking a stool softener if your health care provider approves. Diet  Eat regular, well-balanced meals. Choose a variety of foods, such as meat or vegetable-based protein, fish, milk and low-fat dairy products, vegetables, fruits, and whole grain breads and cereals. Your health care provider will help you determine the amount of weight gain that is right for you.  Avoid raw meat and uncooked cheese. These carry germs that can cause birth defects in the baby.  Eating four or five small meals rather than three large meals a day may help relieve nausea and vomiting. If you start to feel nauseous, eating a few soda crackers can be helpful. Drinking liquids between meals instead of during meals also seems to help nausea and vomiting.  If you develop constipation, eat more high-fiber foods, such as fresh vegetables or fruit and whole grains. Drink enough fluids to keep your urine clear or pale yellow. Activity and Exercise  Exercise only as directed by your health care provider. Exercising will help you:  Control your weight.  Stay in shape.  Be prepared for labor and delivery.  Experiencing pain or cramping in the lower abdomen or low back is a good sign that you should stop exercising. Check with your health care provider before continuing normal exercises.  Try to avoid standing for long  periods of time. Move your legs often if you must stand in one place for a long time.  Avoid heavy lifting.  Wear low-heeled shoes, and practice good posture.  You may continue to have sex unless your health care provider directs you otherwise. Relief of Pain or Discomfort  Wear a good support bra for breast tenderness.   Take warm sitz baths to soothe any pain or discomfort caused by hemorrhoids. Use hemorrhoid cream if your health care provider approves.   Rest with your legs elevated if you have leg cramps or low back pain.  If you develop varicose veins in your legs, wear support hose. Elevate your feet for 15 minutes, 3-4 times a day. Limit salt in your diet. Prenatal Care  Schedule your prenatal visits by the twelfth week of pregnancy. They are usually scheduled monthly at first, then more often in the last 2 months before delivery.  Write down your questions. Take them to your prenatal visits.  Keep all your prenatal visits as directed by your  health care provider. Safety  Wear your seat belt at all times when driving.  Make a list of emergency phone numbers, including numbers for family, friends, the hospital, and police and fire departments. General Tips  Ask your health care provider for a referral to a local prenatal education class. Begin classes no later than at the beginning of month 6 of your pregnancy.  Ask for help if you have counseling or nutritional needs during pregnancy. Your health care provider can offer advice or refer you to specialists for help with various needs.  Do not use hot tubs, steam rooms, or saunas.  Do not douche or use tampons or scented sanitary pads.  Do not cross your legs for long periods of time.  Avoid cat litter boxes and soil used by cats. These carry germs that can cause birth defects in the baby and possibly loss of the fetus by miscarriage or stillbirth.  Avoid all smoking, herbs, alcohol, and medicines not prescribed by  your health care provider. Chemicals in these affect the formation and growth of the baby.  Do not use any tobacco products, including cigarettes, chewing tobacco, and electronic cigarettes. If you need help quitting, ask your health care provider. You may receive counseling support and other resources to help you quit.  Schedule a dentist appointment. At home, brush your teeth with a soft toothbrush and be gentle when you floss. SEEK MEDICAL CARE IF:   You have dizziness.  You have mild pelvic cramps, pelvic pressure, or nagging pain in the abdominal area.  You have persistent nausea, vomiting, or diarrhea.  You have a bad smelling vaginal discharge.  You have pain with urination.  You notice increased swelling in your face, hands, legs, or ankles. SEEK IMMEDIATE MEDICAL CARE IF:   You have a fever.  You are leaking fluid from your vagina.  You have spotting or bleeding from your vagina.  You have severe abdominal cramping or pain.  You have rapid weight gain or loss.  You vomit blood or material that looks like coffee grounds.  You are exposed to [place] measles and have never had them.  You are exposed to fifth disease or chickenpox.  You develop a severe headache.  You have shortness of breath.  You have any kind of trauma, such as from a fall or a car accident.   This information is not intended to replace advice given to you by your health care provider. Make sure you discuss any questions you have with your health care provider.   Document Released: 02/01/2001 Document Revised: 02/28/2014 Document Reviewed: 12/18/2012 Elsevier Interactive Patient Education 2016 Else[employer] Bacterial Vaginosis Bacterial vaginosis is a vaginal infection that occurs when the normal balance of bacteria in the vagina is disrupted. It results from an overgrowth of certain bacteria. This is the most common vaginal infection in women of childbearing age. Treatment is important to  prevent complications, especially in pregnant women, as it can cause a premature delivery. CAUSES  Bacterial vaginosis is caused by an increase in harmful bacteria that are normally present in smaller amounts in the vagina. Several different kinds of bacteria can cause bacterial vaginosis. However, the reason that the condition develops is not fully understood. RISK FACTORS Certain activities or behaviors can put you at an increased risk of developing bacterial vaginosis, including:  Having a new sex partner or multiple sex partners.  Douching.  Using an intrauterine device (IUD) for contraception. Women do not get bacterial vaginosis from toilet seats,  bedding, swimming pools, or contact with objects around them. SIGNS AND SYMPTOMS  Some women with bacterial vaginosis have no signs or symptoms. Common symptoms include:  Grey vaginal discharge.  A fishlike odor with discharge, especially after sexual intercourse.  Itching or burning of the vagina and vulva.  Burning or pain with urination. DIAGNOSIS  Your health care provider will take a medical history and examine the vagina for signs of bacterial vaginosis. A sample of vaginal fluid may be taken. Your health care provider will look at this sample under a microscope to check for bacteria and abnormal cells. A vaginal pH test may also be done.  TREATMENT  Bacterial vaginosis may be treated with antibiotic medicines. These may be given in the form of a pill or a vaginal cream. A second round of antibiotics may be prescribed if the condition comes back after treatment. Because bacterial vaginosis increases your risk for sexually transmitted diseases, getting treated can help reduce your risk for chlamydia, gonorrhea, HIV, and herpes. HOME CARE INSTRUCTIONS   Only take over-the-counter or prescription medicines as directed by your health care provider.  If antibiotic medicine was prescribed, take it as directed. Make sure you finish it  even if you start to feel better.  Tell all sexual partners that you have a vaginal infection. They should see their health care provider and be treated if they have problems, such as a mild rash or itching.  During treatment, it is important that you follow these instructions:  Avoid sexual activity or use condoms correctly.  Do not douche.  Avoid alcohol as directed by your health care provider.  Avoid breastfeeding as directed by your health care provider. SEEK MEDICAL CARE IF:   Your symptoms are not improving after 3 days of treatment.  You have increased discharge or pain.  You have a fever. MAKE SURE YOU:   Understand these instructions.  Will watch your condition.  Will get help right away if you are not doing well or get worse. FOR MORE INFORMATION  Centers for Disease Control and Prevention, Division of STD Prevention: [employer] American Sexual Health Association (ASHA): www.ashastd.org    This information is not intended to replace advice given to you by your health care provider. Make sure you discuss any questions you have with your health care provider.   Document Released: 02/07/2005 Document Revised: 02/28/2014 Document Reviewed: 09/19/2012 Elsevier Interactive Patient Education [employer].

## 2015-10-20 NOTE — MAU Provider Note (Signed)
History     CSN: [id number]  Arrival date and time: 10/20/15 1527   None     Chief Complaint  Patient presents with  . Abdominal Cramping   [id number] @[redacted]w[redacted]d  by LMP c/o pelvic pain x2 days. She denies vaginal bleeding, discharge, itching, or odor. She denies urinary sx. She denies hx of STIs.     OB History    Gravida Para Term Preterm AB Living   3 1 1  0 1 1   SAB TAB Ectopic Multiple Live Births   1              Past Medical History:  Diagnosis Date  . Asthma     Past Surgical History:  Procedure Laterality Date  . DILATION AND CURETTAGE OF UTERUS    . WISDOM TOOTH EXTRACTION      Family History  Problem Relation Age of Onset  . Hypertension Mother   . Hypertension Maternal Grandmother     Social History  Substance Use Topics  . Smoking status: Never Smoker  . Smokeless tobacco: Never Used  . Alcohol use No    Allergies: No Known Allergies  Prescriptions Prior to Admission  Medication Sig Dispense Refill Last Dose  . albuterol (PROVENTIL HFA;VENTOLIN HFA) 108 (90 Base) MCG/ACT inhaler Inhale into the lungs every 6 (six) hours as needed for wheezing or shortness of breath.   months  . ibuprofen (ADVIL,MOTRIN) 800 MG tablet Take 1 tablet (800 mg total) by mouth every 8 (eight) hours as needed for mild pain. 30 tablet 0   . trimethoprim-polymyxin b (POLYTRIM) ophthalmic solution Place 2 drops into both eyes every 4 (four) hours. Use for 5 days 10 mL 0     Review of Systems  Constitutional: Negative.   Gastrointestinal: Negative.   Genitourinary: Negative.    Physical Exam   Blood pressure 127/65, pulse 91, temperature 98.5 F (36.9 C), temperature source Oral, resp. rate 17, height 5\' 4"  (1.626 m), weight 64.4 kg (142 lb), last menstrual period 09/19/2015, SpO2 100 %.  Physical Exam  Constitutional: She is oriented to person, place, and time. She appears well-developed and well-nourished.  HENT:  Head: Normocephalic and atraumatic.  Neck: Normal  range of motion. Neck supple.  Cardiovascular: Normal rate.   Respiratory: Effort normal.  GI: Soft. She exhibits no distension. There is no tenderness.  Genitourinary:  Genitourinary Comments: External: no lesions Vagina: rugated, parous, thin white discharge Uterus: non enlarged, anteverted, non tender, no CMT Adnexae: no masses, no tenderness left, no tenderness right   Musculoskeletal: Normal range of motion.  Neurological: She is alert and oriented to person, place, and time.  Skin: Skin is warm and dry.  Psychiatric: She has a normal mood and affect.   Results for orders placed or performed during the hospital encounter of 10/20/15 (from the past 24 hour(s))  Urinalysis, Routine w reflex microscopic (not at [facility])     Status: None   Collection Time: 10/20/15  3:35 PM  Result Value Ref Range   Color, Urine YELLOW YELLOW   APPearance CLEAR CLEAR   Specific Gravity, Urine 1.010 1.005 - 1.030   pH 7.5 5.0 - 8.0   Glucose, UA NEGATIVE NEGATIVE mg/dL   Hgb urine dipstick NEGATIVE NEGATIVE   Bilirubin Urine NEGATIVE NEGATIVE   Ketones, ur NEGATIVE NEGATIVE mg/dL   Protein, ur NEGATIVE NEGATIVE mg/dL   Nitrite NEGATIVE NEGATIVE   Leukocytes, UA NEGATIVE NEGATIVE  Pregnancy, urine POC     Status: Abnormal  Collection Time: 10/20/15  3:56 PM  Result Value Ref Range   Preg Test, Ur POSITIVE (A) NEGATIVE  Wet prep, genital     Status: Abnormal   Collection Time: 10/20/15  4:20 PM  Result Value Ref Range   Yeast Wet Prep HPF POC NONE SEEN NONE SEEN   Trich, Wet Prep NONE SEEN NONE SEEN   Clue Cells Wet Prep HPF POC PRESENT (A) NONE SEEN   WBC, Wet Prep HPF POC FEW (A) NONE SEEN   Sperm NONE SEEN   hCG, quantitative, pregnancy     Status: Abnormal   Collection Time: 10/20/15  5:08 PM  Result Value Ref Range   hCG, Beta Chain, Quant, S 1,443 (H) <5 mIU/mL   US Ob Comp Less 14 Wks  Result Date: 10/20/2015 CLINICAL DATA:  Pelvic pain for a few days, increasing today. Estimated  gestational age by LMP is 4 weeks 3 days. Quantitative beta HCG is in progress. EXAM: OBSTETRIC <14 WK [place] AND TRANSVAGINAL OB US TECHNIQUE: Both transabdominal and transvaginal ultrasound examinations were performed for complete evaluation of the gestation as well as the maternal uterus, adnexal regions, and pelvic cul-de-sac. Transvaginal technique was performed to assess early pregnancy. COMPARISON:  None. FINDINGS: Intrauterine gestational sac: There is a tiny intrauterine cystic collection which may represent a very early gestational sac. Yolk sac:  None identified. Embryo:  Not identified. Cardiac Activity: Not identified. MSD: 2.7  mm   4 w   6  d Subchorionic hemorrhage:  None visualized. Maternal uterus/adnexae: Uterus is anteverted. Small focal nodular irregularity in the posterior myometrium may represent a small fibroid, measuring about 7 mm maximal diameter. Both ovaries are visualized and appear normal. Probable small corpus luteal cyst on the right. Small amount of free fluid in the pelvis. IMPRESSION: Probable early intrauterine gestational sac, but no yolk sac, fetal pole, or cardiac activity yet visualized. Recommend follow-up quantitative B-HCG levels and follow-up US in 14 days to confirm and assess viability. This recommendation follows SRU consensus guidelines: Diagnostic Criteria for Nonviable Pregnancy Early in the First Trimester. [place] Med [id number]. Electronically Signed   By: Lucienne Capers M.D.   On: 10/20/2015 18:26   US Ob Transvaginal  Result Date: 10/20/2015 CLINICAL DATA:  Pelvic pain for a few days, increasing today. Estimated gestational age by LMP is 4 weeks 3 days. Quantitative beta HCG is in progress. EXAM: OBSTETRIC <14 WK [place] AND TRANSVAGINAL OB US TECHNIQUE: Both transabdominal and transvaginal ultrasound examinations were performed for complete evaluation of the gestation as well as the maternal uterus, adnexal regions, and pelvic cul-de-sac. Transvaginal  technique was performed to assess early pregnancy. COMPARISON:  None. FINDINGS: Intrauterine gestational sac: There is a tiny intrauterine cystic collection which may represent a very early gestational sac. Yolk sac:  None identified. Embryo:  Not identified. Cardiac Activity: Not identified. MSD: 2.7  mm   4 w   6  d Subchorionic hemorrhage:  None visualized. Maternal uterus/adnexae: Uterus is anteverted. Small focal nodular irregularity in the posterior myometrium may represent a small fibroid, measuring about 7 mm maximal diameter. Both ovaries are visualized and appear normal. Probable small corpus luteal cyst on the right. Small amount of free fluid in the pelvis. IMPRESSION: Probable early intrauterine gestational sac, but no yolk sac, fetal pole, or cardiac activity yet visualized. Recommend follow-up quantitative B-HCG levels and follow-up US in 14 days to confirm and assess viability. This recommendation follows SRU consensus guidelines: Diagnostic Criteria for Nonviable  Pregnancy Early in the First Trimester. [place] Med [id number]. Electronically Signed   By: Lucienne Capers M.D.   On: 10/20/2015 18:26   MAU Course  Procedures  MDM Labs ordered and reviewed. No evidence of acute abd or pelvic process. No evidence of ectopic but cannot r/o. Sono shows possible IUGS and pain may be d/t BV. Will plan for rpt quant in 2 days and f/u US. Stable for discharge home.  Assessment and Plan   1. Early stage of pregnancy   2. Pelvic pain affecting pregnancy   3. Bacterial vaginosis   4. Pelvic pain complicating pregnancy    Discharge home Metrogel pv x5 nights SAB/ectopic precautions Follow up quant in 2 days  Follow up US in 2 weeks Return for worsening sx  Julianne Handler, CNM 10/20/2015, 4:25 PM

## 2015-10-21 LAB — GC/CHLAMYDIA PROBE AMP (~~LOC~~) NOT AT ARMC
Chlamydia: NEGATIVE
Neisseria Gonorrhea: NEGATIVE

## 2015-10-22 ENCOUNTER — Ambulatory Visit: Payer: Medicaid Other | Admitting: *Deleted

## 2015-10-22 ENCOUNTER — Inpatient Hospital Stay (HOSPITAL_COMMUNITY)
Admission: AD | Admit: 2015-10-22 | Discharge: 2015-10-22 | Disposition: A | Discharge status: Home / Self Care | Payer: Medicaid Other | Source: Ambulatory Visit | Attending: Obstetrics and Gynecology | Admitting: Obstetrics and Gynecology

## 2015-10-22 DIAGNOSIS — O3680X0 Pregnancy with inconclusive fetal viability, not applicable or unspecified: Secondary | ICD-10-CM

## 2015-10-22 LAB — HCG, QUANTITATIVE, PREGNANCY: HCG, BETA CHAIN, QUANT, S: 3687 m[IU]/mL — AB (ref <5)

## 2015-10-22 NOTE — MAU Note (Signed)
Pt was to go to clinic for F/U appointment, escorted to elevator.

## 2015-10-22 NOTE — Progress Notes (Signed)
Pt in for 48 hour repeat hcg level. Pt denies pain or bleeding. Spoke with Dr. Ilda Basset about her results he recommends followup in 2 weeks for hcg. Patient is already scheduled for prenatal care at another office on 9/25. Pt had no further questions or concerns.

## 2015-11-03 ENCOUNTER — Ambulatory Visit (HOSPITAL_COMMUNITY)
Admission: RE | Admit: 2015-11-03 | Discharge: 2015-11-03 | Disposition: A | Discharge status: Home / Self Care | Payer: Medicaid Other | Source: Ambulatory Visit | Attending: Certified Nurse Midwife | Admitting: Certified Nurse Midwife

## 2015-11-03 ENCOUNTER — Encounter: Payer: Self-pay | Admitting: Family Medicine

## 2015-11-03 ENCOUNTER — Ambulatory Visit: Payer: Self-pay

## 2015-11-03 DIAGNOSIS — Z3A01 Less than 8 weeks gestation of pregnancy: Secondary | ICD-10-CM | POA: Insufficient documentation

## 2015-11-03 DIAGNOSIS — O283 Abnormal ultrasonic finding on antenatal screening of mother: Secondary | ICD-10-CM | POA: Insufficient documentation

## 2015-11-03 DIAGNOSIS — Z3490 Encounter for supervision of normal pregnancy, unspecified, unspecified trimester: Secondary | ICD-10-CM

## 2015-11-03 DIAGNOSIS — O3680X0 Pregnancy with inconclusive fetal viability, not applicable or unspecified: Secondary | ICD-10-CM

## 2015-11-03 NOTE — Progress Notes (Signed)
Pt here today from Radiology for OB US results.  Reviewed [place] results with [place], pt has a good pregnancy and can start prenatal care.  Informed pt of OB US results, EDD 06/25/2016, and pt provided with a proof of pregnancy letter to start prenatal care

## 2015-11-06 ENCOUNTER — Other Ambulatory Visit: Payer: Medicaid Other | Admitting: *Deleted

## 2015-11-06 DIAGNOSIS — Z3490 Encounter for supervision of normal pregnancy, unspecified, unspecified trimester: Secondary | ICD-10-CM

## 2015-11-06 MED ORDER — PRENATAL VITAMINS 0.8 MG PO TABS: 1.0000 | ORAL_TABLET | Freq: Every day | ORAL | 12 refills | Status: DC

## 2015-11-06 NOTE — Progress Notes (Unsigned)
Patient presents to clinic for hcg test, however was in clinic on 9/12 for u/s f/u. Has viable pregnancy was told to start pnc. Spoke with Dr Ilda Basset who agrees she does not need lab draw. Patient requests prescription for pnv, which was sent in to her pharmacy.

## 2015-11-16 ENCOUNTER — Encounter: Payer: Self-pay | Admitting: Obstetrics and Gynecology

## 2015-12-02 ENCOUNTER — Ambulatory Visit (INDEPENDENT_AMBULATORY_CARE_PROVIDER_SITE_OTHER): Payer: Medicaid Other | Admitting: Obstetrics and Gynecology

## 2015-12-02 ENCOUNTER — Other Ambulatory Visit (HOSPITAL_COMMUNITY)
Admission: RE | Admit: 2015-12-02 | Discharge: 2015-12-02 | Disposition: A | Discharge status: Home / Self Care | Payer: Medicaid Other | Source: Ambulatory Visit | Attending: Obstetrics and Gynecology | Admitting: Obstetrics and Gynecology

## 2015-12-02 ENCOUNTER — Encounter: Payer: Self-pay | Admitting: Obstetrics and Gynecology

## 2015-12-02 DIAGNOSIS — Z3491 Encounter for supervision of normal pregnancy, unspecified, first trimester: Secondary | ICD-10-CM | POA: Diagnosis not present

## 2015-12-02 DIAGNOSIS — Z3490 Encounter for supervision of normal pregnancy, unspecified, unspecified trimester: Secondary | ICD-10-CM | POA: Insufficient documentation

## 2015-12-02 DIAGNOSIS — Z3481 Encounter for supervision of other normal pregnancy, first trimester: Secondary | ICD-10-CM

## 2015-12-02 DIAGNOSIS — Z01419 Encounter for gynecological examination (general) (routine) without abnormal findings: Secondary | ICD-10-CM | POA: Insufficient documentation

## 2015-12-02 DIAGNOSIS — Z113 Encounter for screening for infections with a predominantly sexual mode of transmission: Secondary | ICD-10-CM

## 2015-12-02 DIAGNOSIS — Z124 Encounter for screening for malignant neoplasm of cervix: Secondary | ICD-10-CM

## 2015-12-02 NOTE — Patient Instructions (Signed)
First Trimester of Pregnancy The first trimester of pregnancy is from week 1 until the end of week 12 (months 1 through 3). A week after a sperm fertilizes an egg, the egg will implant on the wall of the uterus. This embryo will begin to develop into a baby. Genes from you and your partner are forming the baby. The female genes determine whether the baby is a boy or a girl. At 6-8 weeks, the eyes and face are formed, and the heartbeat can be seen on ultrasound. At the end of 12 weeks, all the baby's organs are formed.  Now that you are pregnant, you will want to do everything you can to have a healthy baby. Two of the most important things are to get good prenatal care and to follow your health care provider's instructions. Prenatal care is all the medical care you receive before the baby's birth. This care will help prevent, find, and treat any problems during the pregnancy and childbirth. BODY CHANGES Your body goes through many changes during pregnancy. The changes vary from woman to woman.   You may gain or lose a couple of pounds at first.  You may feel sick to your stomach (nauseous) and throw up (vomit). If the vomiting is uncontrollable, call your health care provider.  You may tire easily.  You may develop headaches that can be relieved by medicines approved by your health care provider.  You may urinate more often. Painful urination may mean you have a bladder infection.  You may develop heartburn as a result of your pregnancy.  You may develop constipation because certain hormones are causing the muscles that push waste through your intestines to slow down.  You may develop hemorrhoids or swollen, bulging veins (varicose veins).  Your breasts may begin to grow larger and become tender. Your nipples may stick out more, and the tissue that surrounds them (areola) may become darker.  Your gums may bleed and may be sensitive to brushing and flossing.  Dark spots or blotches (chloasma,  mask of pregnancy) may develop on your face. This will likely fade after the baby is born.  Your menstrual periods will stop.  You may have a loss of appetite.  You may develop cravings for certain kinds of food.  You may have changes in your emotions from day to day, such as being excited to be pregnant or being concerned that something may go wrong with the pregnancy and baby.  You may have more vivid and strange dreams.  You may have changes in your hair. These can include thickening of your hair, rapid growth, and changes in texture. Some women also have hair loss during or after pregnancy, or hair that feels dry or thin. Your hair will most likely return to normal after your baby is born. WHAT TO EXPECT AT YOUR PRENATAL VISITS During a routine prenatal visit:  You will be weighed to make sure you and the baby are growing normally.  Your blood pressure will be taken.  Your abdomen will be measured to track your baby's growth.  The fetal heartbeat will be listened to starting around week 10 or 12 of your pregnancy.  Test results from any previous visits will be discussed. Your health care provider may ask you:  How you are feeling.  If you are feeling the baby move.  If you have had any abnormal symptoms, such as leaking fluid, bleeding, severe headaches, or abdominal cramping.  If you are using any tobacco products,   including cigarettes, chewing tobacco, and electronic cigarettes.  If you have any questions. Other tests that may be performed during your first trimester include:  Blood tests to find your blood type and to check for the presence of any previous infections. They will also be used to check for low iron levels (anemia) and Rh antibodies. Later in the pregnancy, blood tests for diabetes will be done along with other tests if problems develop.  Urine tests to check for infections, diabetes, or protein in the urine.  An ultrasound to confirm the proper growth  and development of the baby.  An amniocentesis to check for possible genetic problems.  Fetal screens for spina bifida and Down syndrome.  You may need other tests to make sure you and the baby are doing well.  HIV (human immunodeficiency virus) testing. Routine prenatal testing includes screening for HIV, unless you choose not to have this test. HOME CARE INSTRUCTIONS  Medicines  Follow your health care provider's instructions regarding medicine use. Specific medicines may be either safe or unsafe to take during pregnancy.  Take your prenatal vitamins as directed.  If you develop constipation, try taking a stool softener if your health care provider approves. Diet  Eat regular, well-balanced meals. Choose a variety of foods, such as meat or vegetable-based protein, fish, milk and low-fat dairy products, vegetables, fruits, and whole grain breads and cereals. Your health care provider will help you determine the amount of weight gain that is right for you.  Avoid raw meat and uncooked cheese. These carry germs that can cause birth defects in the baby.  Eating four or five small meals rather than three large meals a day may help relieve nausea and vomiting. If you start to feel nauseous, eating a few soda crackers can be helpful. Drinking liquids between meals instead of during meals also seems to help nausea and vomiting.  If you develop constipation, eat more high-fiber foods, such as fresh vegetables or fruit and whole grains. Drink enough fluids to keep your urine clear or pale yellow. Activity and Exercise  Exercise only as directed by your health care provider. Exercising will help you:  Control your weight.  Stay in shape.  Be prepared for labor and delivery.  Experiencing pain or cramping in the lower abdomen or low back is a good sign that you should stop exercising. Check with your health care provider before continuing normal exercises.  Try to avoid standing for long  periods of time. Move your legs often if you must stand in one place for a long time.  Avoid heavy lifting.  Wear low-heeled shoes, and practice good posture.  You may continue to have sex unless your health care provider directs you otherwise. Relief of Pain or Discomfort  Wear a good support bra for breast tenderness.   Take warm sitz baths to soothe any pain or discomfort caused by hemorrhoids. Use hemorrhoid cream if your health care provider approves.   Rest with your legs elevated if you have leg cramps or low back pain.  If you develop varicose veins in your legs, wear support hose. Elevate your feet for 15 minutes, 3-4 times a day. Limit salt in your diet. Prenatal Care  Schedule your prenatal visits by the twelfth week of pregnancy. They are usually scheduled monthly at first, then more often in the last 2 months before delivery.  Write down your questions. Take them to your prenatal visits.  Keep all your prenatal visits as directed by your   health care provider. Safety  Wear your seat belt at all times when driving.  Make a list of emergency phone numbers, including numbers for family, friends, the hospital, and police and fire departments. General Tips  Ask your health care provider for a referral to a local prenatal education class. Begin classes no later than at the beginning of month 6 of your pregnancy.  Ask for help if you have counseling or nutritional needs during pregnancy. Your health care provider can offer advice or refer you to specialists for help with various needs.  Do not use hot tubs, steam rooms, or saunas.  Do not douche or use tampons or scented sanitary pads.  Do not cross your legs for long periods of time.  Avoid cat litter boxes and soil used by cats. These carry germs that can cause birth defects in the baby and possibly loss of the fetus by miscarriage or stillbirth.  Avoid all smoking, herbs, alcohol, and medicines not prescribed by  your health care provider. Chemicals in these affect the formation and growth of the baby.  Do not use any tobacco products, including cigarettes, chewing tobacco, and electronic cigarettes. If you need help quitting, ask your health care provider. You may receive counseling support and other resources to help you quit.  Schedule a dentist appointment. At home, brush your teeth with a soft toothbrush and be gentle when you floss. SEEK MEDICAL CARE IF:   You have dizziness.  You have mild pelvic cramps, pelvic pressure, or nagging pain in the abdominal area.  You have persistent nausea, vomiting, or diarrhea.  You have a bad smelling vaginal discharge.  You have pain with urination.  You notice increased swelling in your face, hands, legs, or ankles. SEEK IMMEDIATE MEDICAL CARE IF:   You have a fever.  You are leaking fluid from your vagina.  You have spotting or bleeding from your vagina.  You have severe abdominal cramping or pain.  You have rapid weight gain or loss.  You vomit blood or material that looks like coffee grounds.  You are exposed to German measles and have never had them.  You are exposed to fifth disease or chickenpox.  You develop a severe headache.  You have shortness of breath.  You have any kind of trauma, such as from a fall or a car accident.   This information is not intended to replace advice given to you by your health care provider. Make sure you discuss any questions you have with your health care provider.   Document Released: 02/01/2001 Document Revised: 02/28/2014 Document Reviewed: 12/18/2012 Elsevier Interactive Patient Education 2016 [employer]  

## 2015-12-02 NOTE — Progress Notes (Signed)
Subjective:  Sarah Reese is a 30 y.o. G3P1011 at [redacted]w[redacted]d being seen today for her initial prenatal care.She has had a TSVD without problems in 2011. She occ has to use an MDI with URI. She o/w has no chronic medical problems. FOB is not involved with this pregnancy.   She is currently monitored for the following issues for this low-risk pregnancy and has Supervision of normal pregnancy, antepartum on her problem list.  Patient reports no complaints.  Contractions: Not present. Vag. Bleeding: None.  Movement: Absent. Denies leaking of fluid.   The following portions of the patient's history were reviewed and updated as appropriate: allergies, current medications, past family history, past medical history, past social history, past surgical history and problem list. Problem list updated.  Objective:   Vitals:   12/02/15 0855  BP: 109/66  Pulse: 76  Weight: 151 lb (68.5 kg)    Fetal Status: Fetal Heart Rate (bpm): 150   Movement: Absent     General:  Alert, oriented and cooperative. Patient is in no acute distress.  Skin: Skin is warm and dry. No rash noted.   Cardiovascular: Normal heart rate noted  Respiratory: Normal respiratory effort, no problems with respiration noted  Abdomen: Soft, gravid, appropriate for gestational age. Pain/Pressure: Absent     Pelvic:  Cervical exam performed        Extremities: Normal range of motion.  Edema: None  Mental Status: Normal mood and affect. Normal behavior. Normal judgment and thought content.   Urinalysis: Urine Protein: Negative Urine Glucose: Negative  Assessment and Plan:  Pregnancy: G3P1011 at [redacted]w[redacted]d  1. Encounter for supervision of normal pregnancy, antepartum, unspecified gravidity - HIV antibody - Hemoglobinopathy evaluation - Varicella zoster antibody, IgG - Prenatal Profile I - Culture, OB Urine - ToxASSURE Select 13 (MW), Urine - VITAMIN D 25 Hydroxy (Vit-D Deficiency, Fractures) - Cystic Fibrosis Mutation 97 - Flu  Vaccine QUAD 36+ mos Im, declined - Cytology - PAP - GC/Chlamydia probe amp ([place])not at [facility]  Preterm labor symptoms and general obstetric precautions including but not limited to vaginal bleeding, contractions, leaking of fluid and fetal movement were reviewed in detail with the patient. Please refer to After Visit Summary for other counseling recommendations.  Return in about 4 weeks (around 12/30/2015).   Chancy Milroy, MD

## 2015-12-03 LAB — GC/CHLAMYDIA PROBE AMP (~~LOC~~) NOT AT ARMC
CHLAMYDIA, DNA PROBE: NEGATIVE
Neisseria Gonorrhea: NEGATIVE

## 2015-12-03 LAB — CYTOLOGY - PAP

## 2015-12-04 LAB — CULTURE, OB URINE

## 2015-12-04 LAB — URINE CULTURE, OB REFLEX: Organism ID, Bacteria: NO GROWTH

## 2015-12-08 ENCOUNTER — Telehealth: Payer: Self-pay | Admitting: *Deleted

## 2015-12-08 NOTE — Telephone Encounter (Signed)
Pt walked into clinic with her friend that had an apointment. She asked if we could check FHT today because she is nervous. Advised patient that she is still fairly early and it may be difficult to find the heart tones. Pt denies pain bleeding or any other issues pertaining to her pregnancy. I gave her reassurance. She had no further questions or concerns.

## 2015-12-09 ENCOUNTER — Ambulatory Visit (INDEPENDENT_AMBULATORY_CARE_PROVIDER_SITE_OTHER): Payer: Medicaid Other | Admitting: Obstetrics and Gynecology

## 2015-12-09 VITALS — BP 109/68 | HR 88 | Temp 98.4°F | Wt 154.8 lb

## 2015-12-09 DIAGNOSIS — Z3400 Encounter for supervision of normal first pregnancy, unspecified trimester: Secondary | ICD-10-CM

## 2015-12-09 DIAGNOSIS — Z3401 Encounter for supervision of normal first pregnancy, first trimester: Secondary | ICD-10-CM

## 2015-12-09 LAB — PRENATAL PROFILE I(LABCORP)
Antibody Screen: NEGATIVE
BASOS ABS: 0 10*3/uL (ref 0.0–0.2)
Basos: 0 %
EOS (ABSOLUTE): 0.1 10*3/uL (ref 0.0–0.4)
Eos: 1 %
Hematocrit: 33.9 % — ABNORMAL LOW (ref 34.0–46.6)
Hemoglobin: 11.8 g/dL (ref 11.1–15.9)
Hepatitis B Surface Ag: NEGATIVE
IMMATURE GRANULOCYTES: 0 %
Immature Grans (Abs): 0 10*3/uL (ref 0.0–0.1)
LYMPHS: 36 %
Lymphocytes Absolute: 1.6 10*3/uL (ref 0.7–3.1)
MCH: 30.2 pg (ref 26.6–33.0)
MCHC: 34.8 g/dL (ref 31.5–35.7)
MCV: 87 fL (ref 79–97)
MONOCYTES: 11 %
Monocytes Absolute: 0.5 10*3/uL (ref 0.1–0.9)
NEUTROS ABS: 2.3 10*3/uL (ref 1.4–7.0)
NEUTROS PCT: 52 %
PLATELETS: 227 10*3/uL (ref 150–379)
RBC: 3.91 x10E6/uL (ref 3.77–5.28)
RDW: 12.7 % (ref 12.3–15.4)
RPR Ser Ql: NONREACTIVE
Rh Factor: POSITIVE
Rubella Antibodies, IGG: 3.48 index (ref >0.99)
WBC: 4.6 10*3/uL (ref 3.4–10.8)

## 2015-12-09 LAB — TOXASSURE SELECT 13 (MW), URINE

## 2015-12-09 LAB — HEMOGLOBINOPATHY EVALUATION
HEMOGLOBIN A2 QUANTITATION: 2.8 % (ref 0.7–3.1)
HGB A: 97.2 % (ref 94.0–98.0)
HGB C: 0 %
HGB S: 0 %
Hemoglobin F Quantitation: 0 % (ref 0.0–2.0)

## 2015-12-09 LAB — HIV ANTIBODY (ROUTINE TESTING W REFLEX): HIV Screen 4th Generation wRfx: NONREACTIVE

## 2015-12-09 LAB — VITAMIN D 25 HYDROXY (VIT D DEFICIENCY, FRACTURES): VIT D 25 HYDROXY: 17.7 ng/mL — AB (ref 30.0–100.0)

## 2015-12-09 LAB — VARICELLA ZOSTER ANTIBODY, IGG: VARICELLA: 2261 {index} (ref >165)

## 2015-12-09 LAB — CYSTIC FIBROSIS MUTATION 97: GENE DIS ANAL CARRIER INTERP BLD/T-IMP: NOT DETECTED

## 2015-12-09 NOTE — Progress Notes (Signed)
    PRENATAL VISIT NOTE  Subjective:  Sarah Reese is a 30 y.o. G3P1011 at [redacted]w[redacted]d being seen today for ongoing prenatal care.  She is currently monitored for the following issues for this low-risk pregnancy and has Supervision of normal pregnancy, antepartum on her problem list.  Patient reports episode of vaginal spotting this morning which has since resolved, along with some cramping pain. Patient reports having intercourse this morning and all her symptoms started subsequently.  Contractions: Irritability. Vag. Bleeding: Scant.   . Denies leaking of fluid.   The following portions of the patient's history were reviewed and updated as appropriate: allergies, current medications, past family history, past medical history, past social history, past surgical history and problem list. Problem list updated.  Objective:   Vitals:   12/09/15 1331  BP: 109/68  Pulse: 88  Temp: 98.4 F (36.9 C)  Weight: 154 lb 12.8 oz (70.2 kg)    Fetal Status: Fetal Heart Rate (bpm): + on sono         General:  Alert, oriented and cooperative. Patient is in no acute distress.  Skin: Skin is warm and dry. No rash noted.   Cardiovascular: Normal heart rate noted  Respiratory: Normal respiratory effort, no problems with respiration noted  Abdomen: Soft, gravid, appropriate for gestational age. Pain/Pressure: Present     Pelvic:  Cervical exam deferred        Extremities: Normal range of motion.  Edema: None  Mental Status: Normal mood and affect. Normal behavior. Normal judgment and thought content.   Assessment and Plan:  Pregnancy: G3P1011 at [redacted]w[redacted]d  1. Supervision of normal first pregnancy, antepartum Patient declined pelvic exam Reassured when she saw ultrasound with good fetal movement Discussed that vaginal cramping and spotting can be secondary to recent intercourse Patient will return as scheduled for routine prenatal appointment  General obstetric precautions including but not  limited to vaginal bleeding, contractions, leaking of fluid and fetal movement were reviewed in detail with the patient. Please refer to After Visit Summary for other counseling recommendations.  No Follow-up on file.  Sarah Bellman, MD

## 2015-12-09 NOTE — Progress Notes (Signed)
Patient is in the office reporting cramping and scant spotting. Pt did not want to go to MAU because she said that she doesn't think that it is a big concern she just wants to make sure that everything is ok.

## 2015-12-24 ENCOUNTER — Inpatient Hospital Stay (HOSPITAL_COMMUNITY)
Admission: AD | Admit: 2015-12-24 | Discharge: 2015-12-24 | Disposition: A | Discharge status: Home / Self Care | Payer: Medicaid Other | Source: Ambulatory Visit | Attending: Obstetrics & Gynecology | Admitting: Obstetrics & Gynecology

## 2015-12-24 ENCOUNTER — Encounter (HOSPITAL_COMMUNITY): Payer: Self-pay

## 2015-12-24 DIAGNOSIS — O99611 Diseases of the digestive system complicating pregnancy, first trimester: Secondary | ICD-10-CM | POA: Insufficient documentation

## 2015-12-24 DIAGNOSIS — Z8249 Family history of ischemic heart disease and other diseases of the circulatory system: Secondary | ICD-10-CM | POA: Diagnosis not present

## 2015-12-24 DIAGNOSIS — Z3A13 13 weeks gestation of pregnancy: Secondary | ICD-10-CM | POA: Insufficient documentation

## 2015-12-24 DIAGNOSIS — R109 Unspecified abdominal pain: Secondary | ICD-10-CM | POA: Diagnosis present

## 2015-12-24 DIAGNOSIS — K5900 Constipation, unspecified: Secondary | ICD-10-CM | POA: Diagnosis not present

## 2015-12-24 DIAGNOSIS — Z3400 Encounter for supervision of normal first pregnancy, unspecified trimester: Secondary | ICD-10-CM

## 2015-12-24 DIAGNOSIS — O219 Vomiting of pregnancy, unspecified: Secondary | ICD-10-CM | POA: Insufficient documentation

## 2015-12-24 DIAGNOSIS — O26891 Other specified pregnancy related conditions, first trimester: Secondary | ICD-10-CM | POA: Diagnosis not present

## 2015-12-24 LAB — URINALYSIS, ROUTINE W REFLEX MICROSCOPIC
BILIRUBIN URINE: NEGATIVE
Glucose, UA: NEGATIVE mg/dL
Ketones, ur: NEGATIVE mg/dL
Leukocytes, UA: NEGATIVE
Nitrite: NEGATIVE
PH: 6 (ref 5.0–8.0)
Protein, ur: NEGATIVE mg/dL
SPECIFIC GRAVITY, URINE: 1.02 (ref 1.005–1.030)

## 2015-12-24 LAB — WET PREP, GENITAL
CLUE CELLS WET PREP: NONE SEEN
Sperm: NONE SEEN
Trich, Wet Prep: NONE SEEN
YEAST WET PREP: NONE SEEN

## 2015-12-24 LAB — URINE MICROSCOPIC-ADD ON
RBC / HPF: NONE SEEN RBC/hpf (ref 0–5)
WBC, UA: NONE SEEN WBC/hpf (ref 0–5)

## 2015-12-24 MED ORDER — DOCUSATE SODIUM 100 MG PO CAPS: 100.0000 mg | ORAL_CAPSULE | Freq: Two times a day (BID) | ORAL | 2 refills | Status: DC | PRN

## 2015-12-24 MED ORDER — PROMETHAZINE HCL 25 MG PO TABS: 25.0000 mg | ORAL_TABLET | Freq: Four times a day (QID) | ORAL | 0 refills | Status: DC | PRN

## 2015-12-24 MED ORDER — PROMETHAZINE HCL 25 MG PO TABS
25.0000 mg | ORAL_TABLET | Freq: Once | ORAL | Status: AC
  Administered 2015-12-24: 25 mg via ORAL
  Filled 2015-12-24: qty 1

## 2015-12-24 NOTE — MAU Note (Signed)
Pt started having lower abd cramping & pressure this morning @ work, denies bleeding.  Denies dysuria, fever, vomiting or diarrhea.

## 2015-12-24 NOTE — MAU Provider Note (Signed)
History     CSN: [id number]  Arrival date and time: 12/24/15 1120   First Provider Initiated Contact with Patient 12/24/15 1314      Chief Complaint  Patient presents with  . Abdominal Pain   HPI Aniylah Angelika Dermer is a 30 y.o. G3P1011 at [redacted]w[redacted]d who presents with abdominal pain. Symptoms began this morning while at work. Reports lower abdominal cramping & pressure. Pain is constant. Rates 6/10. Has not treated. Nothing makes better or worse. Occasional nausea, no vomiting today. Denies diarrhea, dysuria, vaginal discharge, or vaginal bleeding. Endorses constipation for the last week. Last BM was 3 days ago. Has not treated constipation.   OB History    Gravida Para Term Preterm AB Living   3 1 1  0 1 1   SAB TAB Ectopic Multiple Live Births   1 0 0 0 1      Past Medical History:  Diagnosis Date  . Asthma     Past Surgical History:  Procedure Laterality Date  . DILATION AND CURETTAGE OF UTERUS    . WISDOM TOOTH EXTRACTION      Family History  Problem Relation Age of Onset  . Hypertension Mother   . Hypertension Maternal Grandmother     Social History  Substance Use Topics  . Smoking status: Never Smoker  . Smokeless tobacco: Never Used  . Alcohol use No    Allergies: No Known Allergies  Prescriptions Prior to Admission  Medication Sig Dispense Refill Last Dose  . Prenatal Vit-Fe Fumarate-FA (PRENATAL MULTIVITAMIN) TABS tablet Take 1 tablet by mouth at bedtime.    Past Week at Unknown time    Review of Systems  Constitutional: Negative.   Gastrointestinal: Positive for abdominal pain, constipation and nausea. Negative for diarrhea and vomiting.  Genitourinary: Negative.    Physical Exam   Blood pressure 104/57, pulse 99, temperature 98.3 F (36.8 C), temperature source Oral, resp. rate 16, height 5' 4.5" (1.638 m), weight 158 lb (71.7 kg), last menstrual period 09/19/2015.  Physical Exam  Nursing note and vitals reviewed. Constitutional: She is  oriented to person, place, and time. She appears well-developed and well-nourished. No distress.  HENT:  Head: Normocephalic and atraumatic.  Eyes: Conjunctivae are normal. Right eye exhibits no discharge. Left eye exhibits no discharge. No scleral icterus.  Neck: Normal range of motion.  Cardiovascular: Normal rate, regular rhythm and normal heart sounds.   No murmur heard. Respiratory: Effort normal and breath sounds normal. No respiratory distress. She has no wheezes.  GI: Soft. Bowel sounds are normal. She exhibits no distension. There is no tenderness.  Genitourinary: Cervix exhibits no motion tenderness.  Genitourinary Comments: Cervix closed  Neurological: She is alert and oriented to person, place, and time.  Skin: Skin is warm and dry. She is not diaphoretic.  Psychiatric: She has a normal mood and affect. Her behavior is normal. Judgment and thought content normal.    MAU Course  Procedures Results for orders placed or performed during the hospital encounter of 12/24/15 (from the past 48 hour(s))  GC/Chlamydia probe amp ()not at [facility]     Status: None   Collection Time: 12/24/15 12:00 AM  Result Value Ref Range   Chlamydia Negative     Comment: Normal Reference Range - Negative   Neisseria gonorrhea Negative     Comment: Normal Reference Range - Negative  Urinalysis, Routine w reflex microscopic (not at [facility])     Status: Abnormal   Collection Time: 12/24/15 11:38 AM  Result Value Ref Range   Color, Urine YELLOW YELLOW   APPearance CLEAR CLEAR   Specific Gravity, Urine 1.020 1.005 - 1.030   pH 6.0 5.0 - 8.0   Glucose, UA NEGATIVE NEGATIVE mg/dL   Hgb urine dipstick TRACE (A) NEGATIVE   Bilirubin Urine NEGATIVE NEGATIVE   Ketones, ur NEGATIVE NEGATIVE mg/dL   Protein, ur NEGATIVE NEGATIVE mg/dL   Nitrite NEGATIVE NEGATIVE   Leukocytes, UA NEGATIVE NEGATIVE  Urine microscopic-add on     Status: Abnormal   Collection Time: 12/24/15 11:38 AM  Result Value Ref  Range   Squamous Epithelial / LPF 0-5 (A) NONE SEEN   WBC, UA NONE SEEN 0 - 5 WBC/hpf   RBC / HPF NONE SEEN 0 - 5 RBC/hpf   Bacteria, UA FEW (A) NONE SEEN   Urine-Other MUCOUS PRESENT   Wet prep, genital     Status: Abnormal   Collection Time: 12/24/15  1:43 PM  Result Value Ref Range   Yeast Wet Prep HPF POC NONE SEEN NONE SEEN   Trich, Wet Prep NONE SEEN NONE SEEN   Clue Cells Wet Prep HPF POC NONE SEEN NONE SEEN   WBC, Wet Prep HPF POC FEW (A) NONE SEEN   Sperm NONE SEEN     MDM FHT 150 by doppler Cervix closed GC/CT & wet prep Assessment and Plan  A: 1. Nausea and vomiting during pregnancy prior to [redacted] weeks gestation   2. Supervision of normal first pregnancy, antepartum   3. Constipation during pregnancy in first trimester    P: Discharge home Rx phenergan & colace Discussed reasons to return to MAU Keep f/u with OB  Jorje Guild 12/24/2015, 1:14 PM

## 2015-12-24 NOTE — Discharge Instructions (Signed)
Morning Sickness Morning sickness is when you feel sick to your stomach (nauseous) during pregnancy. This nauseous feeling may or may not come with vomiting. It often occurs in the morning but can be a problem any time of day. Morning sickness is most common during the first trimester, but it may continue throughout pregnancy. While morning sickness is unpleasant, it is usually harmless unless you develop severe and continual vomiting (hyperemesis gravidarum). This condition requires more intense treatment.  CAUSES  The cause of morning sickness is not completely known but seems to be related to normal hormonal changes that occur in pregnancy. RISK FACTORS You are at greater risk if you:  Experienced nausea or vomiting before your pregnancy.  Had morning sickness during a previous pregnancy.  Are pregnant with more than one baby, such as twins. TREATMENT  Do not use any medicines (prescription, over-the-counter, or herbal) for morning sickness without first talking to your health care provider. Your health care provider may prescribe or recommend:  Vitamin B6 supplements.  Anti-nausea medicines.  The herbal medicine ginger. HOME CARE INSTRUCTIONS   Only take over-the-counter or prescription medicines as directed by your health care provider.  Taking multivitamins before getting pregnant can prevent or decrease the severity of morning sickness in most women.  Eat a piece of dry toast or unsalted crackers before getting out of bed in the morning.  Eat five or six small meals a day.  Eat dry and bland foods (rice, baked potato). Foods high in carbohydrates are often helpful.  Do not drink liquids with your meals. Drink liquids between meals.  Avoid greasy, fatty, and spicy foods.  Get someone to cook for you if the smell of any food causes nausea and vomiting.  If you feel nauseous after taking prenatal vitamins, take the vitamins at night or with a snack.  Snack on protein  foods (nuts, yogurt, cheese) between meals if you are hungry.  Eat unsweetened gelatins for desserts.  Wearing an acupressure wristband (worn for sea sickness) may be helpful.  Acupuncture may be helpful.  Do not smoke.  Get a humidifier to keep the air in your house free of odors.  Get plenty of fresh air. SEEK MEDICAL CARE IF:   Your home remedies are not working, and you need medicine.  You feel dizzy or lightheaded.  You are losing weight. SEEK IMMEDIATE MEDICAL CARE IF:   You have persistent and uncontrolled nausea and vomiting.  You pass out (faint). MAKE SURE YOU:  Understand these instructions.  Will watch your condition.  Will get help right away if you are not doing well or get worse.   This information is not intended to replace advice given to you by your health care provider. Make sure you discuss any questions you have with your health care provider.   Document Released: 03/31/2006 Document Revised: 02/12/2013 Document Reviewed: 07/25/2012 Elsevier Interactive Patient Education 2016 [employer]. Constipation, Adult Constipation is when a person has fewer than three bowel movements a week, has difficulty having a bowel movement, or has stools that are dry, hard, or larger than normal. As people grow older, constipation is more common. A low-fiber diet, not taking in enough fluids, and taking certain medicines may make constipation worse.  CAUSES   Certain medicines, such as antidepressants, pain medicine, iron supplements, antacids, and water pills.   Certain diseases, such as diabetes, irritable bowel syndrome (IBS), thyroid disease, or depression.   Not drinking enough water.   Not eating enough fiber-rich  foods.   Stress or travel.   Lack of physical activity or exercise.   Ignoring the urge to have a bowel movement.   Using laxatives too much.  SIGNS AND SYMPTOMS   Having fewer than three bowel movements a week.   Straining to  have a bowel movement.   Having stools that are hard, dry, or larger than normal.   Feeling full or bloated.   Pain in the lower abdomen.   Not feeling relief after having a bowel movement.  DIAGNOSIS  Your health care provider will take a medical history and perform a physical exam. Further testing may be done for severe constipation. Some tests may include:  A barium enema X-ray to examine your rectum, colon, and, sometimes, your small intestine.   A sigmoidoscopy to examine your lower colon.   A colonoscopy to examine your entire colon. TREATMENT  Treatment will depend on the severity of your constipation and what is causing it. Some dietary treatments include drinking more fluids and eating more fiber-rich foods. Lifestyle treatments may include regular exercise. If these diet and lifestyle recommendations do not help, your health care provider may recommend taking over-the-counter laxative medicines to help you have bowel movements. Prescription medicines may be prescribed if over-the-counter medicines do not work.  HOME CARE INSTRUCTIONS   Eat foods that have a lot of fiber, such as fruits, vegetables, whole grains, and beans.  Limit foods high in fat and processed sugars, such as french fries, hamburgers, cookies, candies, and soda.   A fiber supplement may be added to your diet if you cannot get enough fiber from foods.   Drink enough fluids to keep your urine clear or pale yellow.   Exercise regularly or as directed by your health care provider.   Go to the restroom when you have the urge to go. Do not hold it.   Only take over-the-counter or prescription medicines as directed by your health care provider. Do not take other medicines for constipation without talking to your health care provider first.  [place] IF:   You have bright red blood in your stool.   Your constipation lasts for more than 4 days or gets worse.   You have  abdominal or rectal pain.   You have thin, pencil-like stools.   You have unexplained weight loss. MAKE SURE YOU:   Understand these instructions.  Will watch your condition.  Will get help right away if you are not doing well or get worse.   This information is not intended to replace advice given to you by your health care provider. Make sure you discuss any questions you have with your health care provider.   Document Released: 11/06/2003 Document Revised: 02/28/2014 Document Reviewed: 11/19/2012 Elsevier Interactive Patient Education [employer].

## 2015-12-25 LAB — GC/CHLAMYDIA PROBE AMP (~~LOC~~) NOT AT ARMC
CHLAMYDIA, DNA PROBE: NEGATIVE
NEISSERIA GONORRHEA: NEGATIVE

## 2015-12-30 ENCOUNTER — Ambulatory Visit (INDEPENDENT_AMBULATORY_CARE_PROVIDER_SITE_OTHER): Payer: Medicaid Other | Admitting: Certified Nurse Midwife

## 2015-12-30 VITALS — BP 107/69 | HR 88 | Wt 158.0 lb

## 2015-12-30 DIAGNOSIS — Z3490 Encounter for supervision of normal pregnancy, unspecified, unspecified trimester: Secondary | ICD-10-CM

## 2015-12-30 DIAGNOSIS — Z3492 Encounter for supervision of normal pregnancy, unspecified, second trimester: Secondary | ICD-10-CM

## 2015-12-30 DIAGNOSIS — O219 Vomiting of pregnancy, unspecified: Secondary | ICD-10-CM

## 2015-12-30 MED ORDER — ONDANSETRON HCL 8 MG PO TABS: 8.0000 mg | ORAL_TABLET | Freq: Three times a day (TID) | ORAL | 2 refills | Status: DC | PRN

## 2015-12-30 MED ORDER — VITAFOL-NANO 18-0.6-0.4 MG PO TABS: 1.0000 | ORAL_TABLET | Freq: Every day | ORAL | 12 refills | Status: DC

## 2015-12-30 NOTE — Patient Instructions (Addendum)

## 2015-12-30 NOTE — Progress Notes (Signed)
  Subjective:    Sarah Reese is a 30 y.o. female being seen today for her obstetrical visit. She is at [redacted]w[redacted]d gestation. Patient reports: nausea, no bleeding, no contractions, no cramping and no leaking.  Problem List Items Addressed This Visit      Other   Supervision of normal pregnancy, antepartum - Primary   Relevant Medications   Prenatal-Fe Fum-Methf-FA w/o A (VITAFOL-NANO) 18-0.6-0.4 MG TABS   Other Relevant Orders   MaterniT21 PLUS Core+SCA   US OB Comp + 14 Wk    Other Visit Diagnoses    Nausea and vomiting during pregnancy prior to [redacted] weeks gestation       Relevant Medications   ondansetron (ZOFRAN) 8 MG tablet     Patient Active Problem List   Diagnosis Date Noted  . Supervision of normal pregnancy, antepartum 12/02/2015    Objective:     BP 107/69   Pulse 88   Wt 158 lb (71.7 kg)   LMP 09/19/2015 (Exact Date)   BMI 26.70 kg/m  Uterine Size: Below umbilicus   FHR: [id number], by doppler  Assessment:    Pregnancy @ [redacted]w[redacted]d  weeks Doing well    +nausea  Plan:    Problem list reviewed and updated. Labs reviewed.  Follow up in 4 weeks. FIRST/CF mutation testing/NIPT/QUAD SCREEN/fragile X/Ashkenazi Jewish population testing/Spinal muscular atrophy discussed: ordered. Role of ultrasound in pregnancy discussed; fetal survey: ordered. Amniocentesis discussed: not indicated. 50% of 15 minute visit spent on counseling and coordination of care.

## 2016-01-04 ENCOUNTER — Inpatient Hospital Stay (HOSPITAL_COMMUNITY)
Admission: AD | Admit: 2016-01-04 | Discharge: 2016-01-04 | Disposition: A | Discharge status: Home / Self Care | Payer: Medicaid Other | Source: Ambulatory Visit | Attending: Family Medicine | Admitting: Family Medicine

## 2016-01-04 ENCOUNTER — Encounter (HOSPITAL_COMMUNITY): Payer: Self-pay | Admitting: *Deleted

## 2016-01-04 DIAGNOSIS — R4582 Worries: Secondary | ICD-10-CM

## 2016-01-04 DIAGNOSIS — R109 Unspecified abdominal pain: Secondary | ICD-10-CM

## 2016-01-04 DIAGNOSIS — O26892 Other specified pregnancy related conditions, second trimester: Secondary | ICD-10-CM | POA: Insufficient documentation

## 2016-01-04 DIAGNOSIS — R4589 Other symptoms and signs involving emotional state: Secondary | ICD-10-CM

## 2016-01-04 DIAGNOSIS — Z3A15 15 weeks gestation of pregnancy: Secondary | ICD-10-CM | POA: Insufficient documentation

## 2016-01-04 LAB — URINALYSIS, ROUTINE W REFLEX MICROSCOPIC
Bilirubin Urine: NEGATIVE
Glucose, UA: NEGATIVE mg/dL
Hgb urine dipstick: NEGATIVE
Ketones, ur: NEGATIVE mg/dL
LEUKOCYTES UA: NEGATIVE
NITRITE: NEGATIVE
Protein, ur: NEGATIVE mg/dL
SPECIFIC GRAVITY, URINE: 1.01 (ref 1.005–1.030)
pH: 5.5 (ref 5.0–8.0)

## 2016-01-04 MED ORDER — IBUPROFEN 600 MG PO TABS: 600.0000 mg | ORAL_TABLET | Freq: Once | ORAL | Status: DC

## 2016-01-04 NOTE — MAU Provider Note (Signed)
History     CSN: [id number]  Arrival date and time: 01/04/16 1657   First Provider Initiated Contact with Patient 01/04/16 1943      Chief Complaint  Patient presents with  . Abdominal Pain   HPI   [name] is a 30 y.o. female G55P1011 @ [redacted]w[redacted]d here in [place] with abdominal pain. She feels pain all over her abdomen and at times in her "rear end". She has had trouble with constipation throughout her pregnancy; she is taking colace and feels it has improved.  Today the pain has been constant. She has not taken anything for the pain. The pain worsens when she is up walking around, seems to improve when she is laying down. Currently while she is laying down she rates her pain 6/10.   Denies vaginal bleeding Denies history of preterm delivery.   OB History    Gravida Para Term Preterm AB Living   3 1 1  0 1 1   SAB TAB Ectopic Multiple Live Births   1 0 0 0 1      Past Medical History:  Diagnosis Date  . Asthma     Past Surgical History:  Procedure Laterality Date  . DILATION AND CURETTAGE OF UTERUS    . WISDOM TOOTH EXTRACTION      Family History  Problem Relation Age of Onset  . Hypertension Mother   . Hypertension Maternal Grandmother     Social History  Substance Use Topics  . Smoking status: Never Smoker  . Smokeless tobacco: Never Used  . Alcohol use No    Allergies: No Known Allergies  Prescriptions Prior to Admission  Medication Sig Dispense Refill Last Dose  . ondansetron (ZOFRAN) 8 MG tablet Take 1 tablet (8 mg total) by mouth every 8 (eight) hours as needed for nausea or vomiting. 40 tablet 2 Past Week at Unknown time  . Prenatal-Fe Fum-Methf-FA w/o A (VITAFOL-NANO) 18-0.6-0.4 MG TABS Take 1 tablet by mouth at bedtime. 30 tablet 12 01/03/2016 at Unknown time  . promethazine (PHENERGAN) 25 MG tablet Take 1 tablet (25 mg total) by mouth every 6 (six) hours as needed for nausea or vomiting. 30 tablet 0 Past Week at Unknown time  . docusate  sodium (COLACE) 100 MG capsule Take 1 capsule (100 mg total) by mouth 2 (two) times daily as needed. (Patient not taking: Reported on 12/30/2015) 30 capsule 2 Not Taking   Results for orders placed or performed during the hospital encounter of 01/04/16 (from the past 48 hour(s))  Urinalysis, Routine w reflex microscopic (not at [facility])     Status: Abnormal   Collection Time: 01/04/16  5:00 PM  Result Value Ref Range   Color, Urine STRAW (A) YELLOW   APPearance CLEAR CLEAR   Specific Gravity, Urine 1.010 1.005 - 1.030   pH 5.5 5.0 - 8.0   Glucose, UA NEGATIVE NEGATIVE mg/dL   Hgb urine dipstick NEGATIVE NEGATIVE   Bilirubin Urine NEGATIVE NEGATIVE   Ketones, ur NEGATIVE NEGATIVE mg/dL   Protein, ur NEGATIVE NEGATIVE mg/dL   Nitrite NEGATIVE NEGATIVE   Leukocytes, UA NEGATIVE NEGATIVE    Comment: MICROSCOPIC NOT DONE ON URINES WITH NEGATIVE PROTEIN, BLOOD, LEUKOCYTES, NITRITE, OR GLUCOSE <1000 mg/dL.   Review of Systems  Constitutional: Negative for fever.  Gastrointestinal: Positive for abdominal pain and nausea. Negative for constipation, diarrhea and vomiting.  Genitourinary: Negative for dysuria and urgency.   Physical Exam   Blood pressure 115/74, pulse 100, temperature 98.3 F (36.8 C),  temperature source Oral, resp. rate 18, last menstrual period 09/19/2015.  Physical Exam  Constitutional: She is oriented to person, place, and time. She appears well-developed and well-nourished. No distress.  HENT:  Head: Normocephalic.  Eyes: Pupils are equal, round, and reactive to light.  GI: Soft. She exhibits no distension and no mass. There is no tenderness. There is no rebound and no guarding.  Genitourinary:  Genitourinary Comments: Cervix: Closed, thick, posterior   Musculoskeletal: Normal range of motion.  Neurological: She is alert and oriented to person, place, and time.  Skin: Skin is warm. She is not diaphoretic.  Psychiatric: Her behavior is normal.    MAU Course   Procedures  None  MDM  + fetal heart tones via doppler  Recent STD testing negative  Patient feels much better after finding out her cervix is closed; she was feeling worried.   Assessment and Plan   A:  1. Abdominal pain in pregnancy, second trimester   2. Feeling worried     P:  Discharge home in stable condition Ok to use ibuprofen as needed, as directed on the bottle If symptoms worsen, return to [facility] given    Lezlie Lye, NP 01/04/2016 8:08 PM

## 2016-01-04 NOTE — Discharge Instructions (Signed)

## 2016-01-04 NOTE — MAU Note (Signed)
Urine in lab 

## 2016-01-04 NOTE — MAU Note (Signed)
Pt C/O LLQ pain since this morning, very sharp, "feels like contractions."  Denies bleeding, vomiting or diarrhea.  No dysuria.

## 2016-01-18 ENCOUNTER — Telehealth: Payer: Self-pay | Admitting: *Deleted

## 2016-01-18 NOTE — Telephone Encounter (Signed)
Placed call to pt to make her aware that lab was unable to process her Materni 21 bloodwork. Pt was advised that she may come into office to have redrawn if she chooses.  Pt states that she has an appt on 12/7 and can have redrawn at that appt.

## 2016-01-26 NOTE — Addendum Note (Signed)
Addended by: Tarry Kos on: 01/26/2016 01:59 PM   Modules accepted: Orders

## 2016-01-28 ENCOUNTER — Ambulatory Visit (INDEPENDENT_AMBULATORY_CARE_PROVIDER_SITE_OTHER): Payer: Medicaid Other

## 2016-01-28 ENCOUNTER — Ambulatory Visit (INDEPENDENT_AMBULATORY_CARE_PROVIDER_SITE_OTHER): Payer: Medicaid Other | Admitting: Obstetrics and Gynecology

## 2016-01-28 VITALS — BP 111/68 | HR 86 | Wt 163.4 lb

## 2016-01-28 DIAGNOSIS — Z3482 Encounter for supervision of other normal pregnancy, second trimester: Secondary | ICD-10-CM

## 2016-01-28 DIAGNOSIS — Z3490 Encounter for supervision of normal pregnancy, unspecified, unspecified trimester: Secondary | ICD-10-CM

## 2016-01-28 DIAGNOSIS — Z3492 Encounter for supervision of normal pregnancy, unspecified, second trimester: Secondary | ICD-10-CM

## 2016-01-28 DIAGNOSIS — Z3480 Encounter for supervision of other normal pregnancy, unspecified trimester: Secondary | ICD-10-CM

## 2016-01-28 NOTE — Progress Notes (Signed)
Patient is concerned about her weight gain

## 2016-01-28 NOTE — Progress Notes (Signed)
   PRENATAL VISIT NOTE  Subjective:  Sarah Reese is a 30 y.o. G3P1011 at 104w5d being seen today for ongoing prenatal care.  She is currently monitored for the following issues for this low-risk pregnancy and has Supervision of normal pregnancy, antepartum on her problem list.  Patient reports no complaints.  Contractions: Not present. Vag. Bleeding: None.  Movement: Present. Denies leaking of fluid.   The following portions of the patient's history were reviewed and updated as appropriate: allergies, current medications, past family history, past medical history, past social history, past surgical history and problem list. Problem list updated.  Objective:   Vitals:   01/28/16 0955  BP: 111/68  Pulse: 86  Weight: 163 lb 6.4 oz (74.1 kg)    Fetal Status: Fetal Heart Rate (bpm): 142   Movement: Present     General:  Alert, oriented and cooperative. Patient is in no acute distress.  Skin: Skin is warm and dry. No rash noted.   Cardiovascular: Normal heart rate noted  Respiratory: Normal respiratory effort, no problems with respiration noted  Abdomen: Soft, gravid, appropriate for gestational age. Pain/Pressure: Absent     Pelvic:  Cervical exam deferred        Extremities: Normal range of motion.  Edema: None  Mental Status: Normal mood and affect. Normal behavior. Normal judgment and thought content.   Assessment and Plan:  Pregnancy: G3P1011 at [redacted]w[redacted]d  1. Supervision of other normal pregnancy, antepartum Patient is doing well without complaints Anatomy ultrasound done today MaterniT21 drawn again today - MaterniT 21 plus Core, Blood  General obstetric precautions including but not limited to vaginal bleeding, contractions, leaking of fluid and fetal movement were reviewed in detail with the patient. Please refer to After Visit Summary for other counseling recommendations.  Return in about 4 weeks (around 02/25/2016).   Mora Bellman, MD

## 2016-02-03 ENCOUNTER — Emergency Department
Admission: EM | Admit: 2016-02-03 | Discharge: 2016-02-03 | Disposition: A | Discharge status: Home / Self Care | Payer: Medicaid Other | Attending: Emergency Medicine | Admitting: Emergency Medicine

## 2016-02-03 DIAGNOSIS — O2342 Unspecified infection of urinary tract in pregnancy, second trimester: Secondary | ICD-10-CM | POA: Insufficient documentation

## 2016-02-03 DIAGNOSIS — Z79899 Other long term (current) drug therapy: Secondary | ICD-10-CM | POA: Insufficient documentation

## 2016-02-03 DIAGNOSIS — Z3A19 19 weeks gestation of pregnancy: Secondary | ICD-10-CM | POA: Insufficient documentation

## 2016-02-03 DIAGNOSIS — N390 Urinary tract infection, site not specified: Secondary | ICD-10-CM

## 2016-02-03 DIAGNOSIS — J45909 Unspecified asthma, uncomplicated: Secondary | ICD-10-CM | POA: Insufficient documentation

## 2016-02-03 DIAGNOSIS — R102 Pelvic and perineal pain: Secondary | ICD-10-CM

## 2016-02-03 DIAGNOSIS — O26899 Other specified pregnancy related conditions, unspecified trimester: Secondary | ICD-10-CM

## 2016-02-03 LAB — CBC
HEMATOCRIT: 31.7 % — AB (ref 35.0–47.0)
HEMOGLOBIN: 10.8 g/dL — AB (ref 12.0–16.0)
MCH: 31.2 pg (ref 26.0–34.0)
MCHC: 34 g/dL (ref 32.0–36.0)
MCV: 91.7 fL (ref 80.0–100.0)
Platelets: 176 10*3/uL (ref 150–440)
RBC: 3.46 MIL/uL — AB (ref 3.80–5.20)
RDW: 13.6 % (ref 11.5–14.5)
WBC: 6.8 10*3/uL (ref 3.6–11.0)

## 2016-02-03 LAB — URINALYSIS, COMPLETE (UACMP) WITH MICROSCOPIC
BILIRUBIN URINE: NEGATIVE
Glucose, UA: NEGATIVE mg/dL
Hgb urine dipstick: NEGATIVE
KETONES UR: NEGATIVE mg/dL
Nitrite: NEGATIVE
Protein, ur: NEGATIVE mg/dL
Specific Gravity, Urine: 1.016 (ref 1.005–1.030)
pH: 6 (ref 5.0–8.0)

## 2016-02-03 LAB — COMPREHENSIVE METABOLIC PANEL
ALT: 14 U/L (ref 14–54)
AST: 29 U/L (ref 15–41)
Albumin: 3.3 g/dL — ABNORMAL LOW (ref 3.5–5.0)
Alkaline Phosphatase: 40 U/L (ref 38–126)
Anion gap: 7 (ref 5–15)
BUN: 6 mg/dL (ref 6–20)
CHLORIDE: 105 mmol/L (ref 101–111)
CO2: 22 mmol/L (ref 22–32)
Calcium: 9.1 mg/dL (ref 8.9–10.3)
Creatinine, Ser: 0.52 mg/dL (ref 0.44–1.00)
GFR calc non Af Amer: >60 mL/min (ref >60)
Glucose, Bld: 92 mg/dL (ref 65–99)
POTASSIUM: 3.2 mmol/L — AB (ref 3.5–5.1)
Sodium: 134 mmol/L — ABNORMAL LOW (ref 135–145)
Total Bilirubin: 0.4 mg/dL (ref 0.3–1.2)
Total Protein: 7 g/dL (ref 6.5–8.1)

## 2016-02-03 LAB — LIPASE, BLOOD: LIPASE: 24 U/L (ref 11–51)

## 2016-02-03 MED ORDER — NITROFURANTOIN MACROCRYSTAL 100 MG PO CAPS: 100.0000 mg | ORAL_CAPSULE | Freq: Two times a day (BID) | ORAL | 0 refills | Status: DC

## 2016-02-03 NOTE — ED Triage Notes (Addendum)
Pelvic cramping, pain with walking that began yesterday. No bleeding. Denies NVD. Pt alert and oriented X4, active, cooperative, pt in NAD. RR even and unlabored, color WNL.    Pt 19weeks 4 days pregnant.

## 2016-02-03 NOTE — ED Provider Notes (Signed)
[facility] Emergency Department Provider Note  ____________________________________________  Time seen: Approximately 6:43 PM  I have reviewed the triage vital signs and the nursing notes.   HISTORY  Chief Complaint Pelvic Pain    HPI Sarah Reese is a 30 y.o. female who complains of pelvic pain for the past 2 days. He also notes that she's been urinating more frequently. No dysuria. She is [redacted] weeks pregnant. She follows up with obstetrics in [place]. Denies vaginal bleeding or discharge. Denies back pain. No contractions. No fluid leakage. Normal fetal movements. Eating and drinking normally.     Past Medical History:  Diagnosis Date  . Asthma      Patient Active Problem List   Diagnosis Date Noted  . Supervision of normal pregnancy, antepartum 12/02/2015     Past Surgical History:  Procedure Laterality Date  . DILATION AND CURETTAGE OF UTERUS    . WISDOM TOOTH EXTRACTION       Prior to Admission medications   Medication Sig Start Date End Date Taking? Authorizing Provider  docusate sodium (COLACE) 100 MG capsule Take 1 capsule (100 mg total) by mouth 2 (two) times daily as needed. Patient not taking: Reported on 01/28/2016 12/24/15   Jorje Guild, NP  nitrofurantoin (MACRODANTIN) 100 MG capsule Take 1 capsule (100 mg total) by mouth 2 (two) times daily. 02/03/16   Carrie Mew, MD  Prenatal-Fe Fum-Methf-FA w/o A (VITAFOL-NANO) 18-0.6-0.4 MG TABS Take 1 tablet by mouth at bedtime. 12/30/15   Rachelle A Denney, CNM  promethazine (PHENERGAN) 25 MG tablet Take 1 tablet (25 mg total) by mouth every 6 (six) hours as needed for nausea or vomiting. Patient not taking: Reported on 01/28/2016 12/24/15   Jorje Guild, NP     Allergies Patient has no known allergies.   Family History  Problem Relation Age of Onset  . Hypertension Mother   . Hypertension Maternal Grandmother     Social History Social History  Substance Use  Topics  . Smoking status: Never Smoker  . Smokeless tobacco: Never Used  . Alcohol use No    Review of Systems  Constitutional:   No fever or chills.  ENT:   No sore throat. No rhinorrhea. Cardiovascular:   No chest pain. Respiratory:   No dyspnea or cough. Gastrointestinal:   Negative for abdominal pain, vomiting and diarrhea.  Genitourinary:   Positive urinary frequency Musculoskeletal:   Negative for focal pain or swelling Neurological:   Negative for headaches 10-point ROS otherwise negative.  ____________________________________________   PHYSICAL EXAM:  VITAL SIGNS: ED Triage Vitals [02/03/16 1647]  Enc Vitals Group     BP (!) 107/59     Pulse Rate 91     Resp 18     Temp 98.5 F (36.9 C)     Temp Source Oral     SpO2 96 %     Weight 163 lb (73.9 kg)     Height 5\' 4"  (1.626 m)     Head Circumference      Peak Flow      Pain Score 7     Pain Loc      Pain Edu?      Excl. in [place]?     Vital signs reviewed, nursing assessments reviewed.   Constitutional:   Alert and oriented. Well appearing and in no distress. Eyes:   No scleral icterus. No conjunctival pallor. PERRL. EOMI.  No nystagmus. ENT   Head:   Normocephalic and atraumatic.  Nose:   No congestion/rhinnorhea. No septal hematoma   Mouth/Throat:   MMM, no pharyngeal erythema. No peritonsillar mass.    Neck:   No stridor. No SubQ emphysema. No meningismus. Hematological/Lymphatic/Immunilogical:   No cervical lymphadenopathy. Cardiovascular:   RRR. Symmetric bilateral radial and DP pulses.  No murmurs.  Respiratory:   Normal respiratory effort without tachypnea nor retractions. Breath sounds are clear and equal bilaterally. No wheezes/rales/rhonchi. Gastrointestinal:   Soft With mild suprapubic tenderness. Non distended. There is no CVA tenderness.  No rebound, rigidity, or guarding.  Bedside ultrasound performed by me to evaluate pregnancy, grossly normal amniotic fluid space. Vigorous fetal  gross motor activity. Fetal heart rate about 143 bpm.  Genitourinary:   deferred Musculoskeletal:   Nontender with normal range of motion in all extremities. No joint effusions.  No lower extremity tenderness.  No edema. Neurologic:   Normal speech and language.  CN 2-10 normal. Motor grossly intact. No gross focal neurologic deficits are appreciated.  Skin:    Skin is warm, dry and intact. No rash noted.  No petechiae, purpura, or bullae.  ____________________________________________    LABS (pertinent positives/negatives) (all labs ordered are listed, but only abnormal results are displayed) Labs Reviewed  COMPREHENSIVE METABOLIC PANEL - Abnormal; Notable for the following:       Result Value   Sodium 134 (*)    Potassium 3.2 (*)    Albumin 3.3 (*)    All other components within normal limits  CBC - Abnormal; Notable for the following:    RBC 3.46 (*)    Hemoglobin 10.8 (*)    HCT 31.7 (*)    All other components within normal limits  URINALYSIS, COMPLETE (UACMP) WITH MICROSCOPIC - Abnormal; Notable for the following:    Color, Urine YELLOW (*)    APPearance HAZY (*)    Leukocytes, UA TRACE (*)    Bacteria, UA MANY (*)    Squamous Epithelial / LPF 0-5 (*)    All other components within normal limits  LIPASE, BLOOD   ____________________________________________   EKG    ____________________________________________    RADIOLOGY    ____________________________________________   PROCEDURES Procedures  ____________________________________________   INITIAL IMPRESSION / ASSESSMENT AND PLAN / ED COURSE  Pertinent labs & imaging results that were available during my care of the patient were reviewed by me and considered in my medical decision making (see chart for details).  Patient well appearing no acute distress, normal vital signs, unremarkable exam except for signs and symptoms of urinary tract infection. Urinalysis is consistent with urinary tract  infection. Urine culture sent. I'll place the patient on a week of Macrobid, follow up with her obstetrics clinic in [place]. Considering the patient's symptoms, medical history, and physical examination today, I have low suspicion for cholecystitis or biliary pathology, pancreatitis, perforation or bowel obstruction, hernia, intra-abdominal abscess, AAA or dissection, volvulus or intussusception, mesenteric ischemia, or appendicitis. Low suspicion of complication of pregnancy at this time       Clinical Course    ____________________________________________   FINAL CLINICAL IMPRESSION(S) / ED DIAGNOSES  Final diagnoses:  Pelvic pain in pregnancy  Lower urinary tract infectious disease      New Prescriptions   NITROFURANTOIN (MACRODANTIN) 100 MG CAPSULE    Take 1 capsule (100 mg total) by mouth 2 (two) times daily.     Portions of this note were generated with dragon dictation software. Dictation errors may occur despite best attempts at proofreading.    Carrie Mew, MD  02/03/16 1846  

## 2016-02-03 NOTE — ED Notes (Signed)
Pt a/o. Clear lung sounds, + bs. C/o cramping pelvis 8/10 pain. Denies urinary frequency or burning

## 2016-02-03 NOTE — ED Notes (Signed)
Pt given sprite 

## 2016-02-05 ENCOUNTER — Other Ambulatory Visit: Payer: Self-pay | Admitting: Certified Nurse Midwife

## 2016-02-05 DIAGNOSIS — Z3480 Encounter for supervision of other normal pregnancy, unspecified trimester: Secondary | ICD-10-CM

## 2016-02-22 NOTE — L&D Delivery Note (Signed)
Delivery Note At 1:00 PM a viable female was delivered via  (Presentation:vertex ; LOA ).  APGAR:9 , 9; weight  .   Placenta status:spont , shultz.  Cord:3vc  with the following complications: none.  Cord pH: n/a  Anesthesia:  lidocaine Episiotomy:  none Lacerations:  2nd Suture Repair: 2.0 Est. Blood Loss 100(mL):    Mom to postpartum.  Baby to Couplet care / Skin to Skin.  Koren Shiver 06/19/2016, 1:16 PM

## 2016-02-23 ENCOUNTER — Observation Stay
Admission: EM | Admit: 2016-02-23 | Discharge: 2016-02-23 | Disposition: A | Discharge status: Home / Self Care | Payer: Medicaid Other | Attending: Advanced Practice Midwife | Admitting: Advanced Practice Midwife

## 2016-02-23 DIAGNOSIS — O26892 Other specified pregnancy related conditions, second trimester: Secondary | ICD-10-CM | POA: Diagnosis not present

## 2016-02-23 DIAGNOSIS — R109 Unspecified abdominal pain: Secondary | ICD-10-CM | POA: Diagnosis not present

## 2016-02-23 DIAGNOSIS — Z3A22 22 weeks gestation of pregnancy: Secondary | ICD-10-CM | POA: Insufficient documentation

## 2016-02-23 DIAGNOSIS — O368120 Decreased fetal movements, second trimester, not applicable or unspecified: Secondary | ICD-10-CM | POA: Diagnosis not present

## 2016-02-23 LAB — URINALYSIS, COMPLETE (UACMP) WITH MICROSCOPIC
BILIRUBIN URINE: NEGATIVE
GLUCOSE, UA: NEGATIVE mg/dL
HGB URINE DIPSTICK: NEGATIVE
Ketones, ur: NEGATIVE mg/dL
Leukocytes, UA: NEGATIVE
NITRITE: NEGATIVE
PROTEIN: NEGATIVE mg/dL
RBC / HPF: NONE SEEN RBC/hpf (ref 0–5)
Specific Gravity, Urine: 1.003 — ABNORMAL LOW (ref 1.005–1.030)
pH: 6 (ref 5.0–8.0)

## 2016-02-23 NOTE — Discharge Instructions (Signed)
Discharge instructions given, all questions answered.

## 2016-02-23 NOTE — Discharge Summary (Signed)
Physician Final Progress Note  Patient ID: Sarah Reese MRN: [id number] DOB/AGE: 1985/06/15 31 y.o.  Admit date: 02/23/2016 Admitting provider: Rod Can, CNM Discharge date: 02/23/2016   Admission Diagnoses: G3P1011 at [redacted]w[redacted]d with c/o decreased fetal movement and occasional lower abdominal pain. Pt denies contractions, LOF, VB. She denies s/s of UTI.  Discharge Diagnoses:  Active Problems:   Indication for care in labor and delivery, antepartum IUP with positive fetal heart tones/positive fetal movement/no abdominal pain   Past Medical History:  Diagnosis Date  . Asthma     Past Surgical History:  Procedure Laterality Date  . DILATION AND CURETTAGE OF UTERUS    . WISDOM TOOTH EXTRACTION      No current facility-administered medications on file prior to encounter.    Current Outpatient Prescriptions on File Prior to Encounter  Medication Sig Dispense Refill  . Prenatal-Fe Fum-Methf-FA w/o A (VITAFOL-NANO) 18-0.6-0.4 MG TABS Take 1 tablet by mouth at bedtime. 30 tablet 12  . docusate sodium (COLACE) 100 MG capsule Take 1 capsule (100 mg total) by mouth 2 (two) times daily as needed. (Patient not taking: Reported on 02/23/2016) 30 capsule 2  . nitrofurantoin (MACRODANTIN) 100 MG capsule Take 1 capsule (100 mg total) by mouth 2 (two) times daily. (Patient not taking: Reported on 02/23/2016) 14 capsule 0  . promethazine (PHENERGAN) 25 MG tablet Take 1 tablet (25 mg total) by mouth every 6 (six) hours as needed for nausea or vomiting. (Patient not taking: Reported on 02/23/2016) 30 tablet 0    No Known Allergies  Social History   Social History  . Marital status: Single    Spouse name: N/A  . Number of children: N/A  . Years of education: N/A   Occupational History  . Not on file.   Social History Main Topics  . Smoking status: Never Smoker  . Smokeless tobacco: Never Used  . Alcohol use No  . Drug use: No  . Sexual activity: Yes    Birth control/ protection:  None   Other Topics Concern  . Not on file   Social History Narrative   ** Merged History Encounter **        Physical Exam: Temp 98.6 F (37 C) (Oral)   Resp 18   Ht 5' 4.5" (1.638 m)   Wt 162 lb (73.5 kg)   LMP 09/19/2015 (Exact Date)   BMI 27.38 kg/m   Gen: NAD CV: RRR Pulm: CTAB Pelvic: deferred Toco: negative Ext: no evidence of DVT Fetal Well Being: 150 bpm, moderate variability, + 10x10 acceleration, -deceleration  Consults: None  Significant Findings/ Diagnostic Studies: labs:   Results for Sarah, Reese (MRN [id number]) as of 02/23/2016 21:08  Ref. Range 02/23/2016 18:52  Appearance Latest Ref Range: CLEAR  CLEAR (A)  Bacteria, UA Latest Ref Range: NONE SEEN  RARE (A)  Bilirubin Urine Latest Ref Range: NEGATIVE  NEGATIVE  Color, Urine Latest Ref Range: YELLOW  STRAW (A)  Glucose Latest Ref Range: NEGATIVE mg/dL NEGATIVE  Hgb urine dipstick Latest Ref Range: NEGATIVE  NEGATIVE  Ketones, ur Latest Ref Range: NEGATIVE mg/dL NEGATIVE  Leukocytes, UA Latest Ref Range: NEGATIVE  NEGATIVE  Nitrite Latest Ref Range: NEGATIVE  NEGATIVE  pH Latest Ref Range: 5.0 - 8.0  6.0  Protein Latest Ref Range: NEGATIVE mg/dL NEGATIVE  RBC / HPF Latest Ref Range: 0 - 5 RBC/hpf NONE SEEN  Specific Gravity, Urine Latest Ref Range: 1.005 - 1.030  1.003 (L)  Squamous Epithelial / LPF Latest  Ref Range: NONE SEEN  0-5 (A)  WBC, UA Latest Ref Range: 0 - 5 WBC/hpf 0-5    Procedures: none  Discharge Condition: good  Disposition: 01-Home or Self Care  Diet: Regular diet, increase hydration  Discharge Activity: Activity as tolerated  Discharge Instructions    Discharge activity:  No Restrictions    Complete by:  As directed    Discharge diet:  No restrictions    Complete by:  As directed    No sexual activity restrictions    Complete by:  As directed    Notify physician for a general feeling that "something is not right"    Complete by:  As directed    Notify  physician for increase or change in vaginal discharge    Complete by:  As directed    Notify physician for intestinal cramps, with or without diarrhea, sometimes described as "gas pain"    Complete by:  As directed    Notify physician for leaking of fluid    Complete by:  As directed    Notify physician for low, dull backache, unrelieved by heat or Tylenol    Complete by:  As directed    Notify physician for menstrual like cramps    Complete by:  As directed    Notify physician for pelvic pressure    Complete by:  As directed    Notify physician for uterine contractions.  These may be painless and feel like the uterus is tightening or the baby is  "balling up"    Complete by:  As directed    Notify physician for vaginal bleeding    Complete by:  As directed    PRETERM LABOR:  Includes any of the follwing symptoms that occur between 20 - [redacted] weeks gestation.  If these symptoms are not stopped, preterm labor can result in preterm delivery, placing your baby at risk    Complete by:  As directed      Allergies as of 02/23/2016   No Known Allergies     Medication List    STOP taking these medications   docusate sodium 100 MG capsule Commonly known as:  COLACE   nitrofurantoin 100 MG capsule Commonly known as:  MACRODANTIN   promethazine 25 MG tablet Commonly known as:  PHENERGAN     TAKE these medications   VITAFOL-NANO 18-0.6-0.4 MG Tabs Take 1 tablet by mouth at bedtime.        Total time spent taking care of this patient: 15 minutes  Signed: Rod Can, CNM  02/23/2016, 9:05 PM

## 2016-02-23 NOTE — OB Triage Note (Signed)
Presents with complaint of decreased fetal movement and lower abdominal pain that comes and goes about every 25 mins.  Patient denies vaginal bleeding. States that she is very stressed about her living arrangement and her unemployment. States she hasnt eaten today. Denies any burning with voiding but states she has been going more frequently.

## 2016-02-25 ENCOUNTER — Encounter: Payer: Medicaid Other | Admitting: Family Medicine

## 2016-03-02 ENCOUNTER — Observation Stay
Admission: EM | Admit: 2016-03-02 | Discharge: 2016-03-02 | Disposition: A | Discharge status: Home / Self Care | Payer: Medicaid Other | Attending: Obstetrics and Gynecology | Admitting: Obstetrics and Gynecology

## 2016-03-02 DIAGNOSIS — Z3A23 23 weeks gestation of pregnancy: Secondary | ICD-10-CM | POA: Diagnosis not present

## 2016-03-02 DIAGNOSIS — O368120 Decreased fetal movements, second trimester, not applicable or unspecified: Secondary | ICD-10-CM | POA: Diagnosis not present

## 2016-03-02 NOTE — Final Progress Note (Signed)
Physician Final Progress Note  Patient ID: Sarah Reese MRN: [id number] DOB/AGE: 19-Sep-1985 31 y.o.  Admit date: 03/02/2016 Admitting provider: Will Bonnet, MD Discharge date: 03/02/2016   Admission Diagnoses:  1) intrauterine pregnancy at [redacted]w[redacted]d  2) decreased fetal movement  Discharge Diagnoses:  1) intrauterine pregnancy at [redacted]w[redacted]d  2) decreased fetal movement 3) demonstrated fetal movement on ultrasound  History of Present Illness: The patient is a 31 y.o. female G3P1011 at [redacted]w[redacted]d who presents for decreased fetal movement for the past 24 hours.  She has tried drinking water. She denies complications of her pregnancy.  She receives her prenatal care in [place].   Hospital Course: admitted for observation.  +FHR obtained.  Bedside u/s performed to demonstrate fetal movement, which was essentially continuous during the exam.  Placenta is posterior.  Fetus is in breech presentation. Gender is female.   Past Medical History:  Diagnosis Date  . Asthma     Past Surgical History:  Procedure Laterality Date  . DILATION AND CURETTAGE OF UTERUS    . WISDOM TOOTH EXTRACTION      No current facility-administered medications on file prior to encounter.    Current Outpatient Prescriptions on File Prior to Encounter  Medication Sig Dispense Refill  . docusate sodium (COLACE) 100 MG capsule Take 1 capsule (100 mg total) by mouth 2 (two) times daily as needed. (Patient not taking: Reported on 02/23/2016) 30 capsule 2  . nitrofurantoin (MACRODANTIN) 100 MG capsule Take 1 capsule (100 mg total) by mouth 2 (two) times daily. (Patient not taking: Reported on 02/23/2016) 14 capsule 0  . Prenatal-Fe Fum-Methf-FA w/o A (VITAFOL-NANO) 18-0.6-0.4 MG TABS Take 1 tablet by mouth at bedtime. 30 tablet 12  . promethazine (PHENERGAN) 25 MG tablet Take 1 tablet (25 mg total) by mouth every 6 (six) hours as needed for nausea or vomiting. (Patient not taking: Reported on 02/23/2016) 30 tablet 0     No Known Allergies  Social History   Social History  . Marital status: Single    Spouse name: N/A  . Number of children: N/A  . Years of education: N/A   Occupational History  . Not on file.   Social History Main Topics  . Smoking status: Never Smoker  . Smokeless tobacco: Never Used  . Alcohol use No  . Drug use: No  . Sexual activity: Yes    Birth control/ protection: None   Other Topics Concern  . Not on file   Social History Narrative   ** Merged History Encounter **        Physical Exam: Temp 98.5 F (36.9 C) (Oral)   Resp 20   LMP 09/19/2015 (Exact Date)   Gen: NAD CV: RRR Pulm: CTAB Pelvic: deferred Ext: no e/c/t  Consults: None  Significant Findings/ Diagnostic Studies: None  Procedures: Fetal heart tones: 135 bpm Bedside u/s: +FM as noted above  Discharge Condition: stable  Disposition: 01-Home or Self Care  Diet: Regular diet  Discharge Activity: Activity as tolerated  Discharge Instructions    Discharge patient    Complete by:  As directed    Discharge disposition:  01-Home or Self Care   Discharge patient date:  03/02/2016     Allergies as of 03/02/2016   No Known Allergies     Medication List    STOP taking these medications   docusate sodium 100 MG capsule Commonly known as:  COLACE   nitrofurantoin 100 MG capsule Commonly known as:  MACRODANTIN   promethazine  25 MG tablet Commonly known as:  PHENERGAN   VITAFOL-NANO 18-0.6-0.4 MG Tabs        Total time spent taking care of this patient: 25 minutes  Signed: Will Bonnet, MD  03/02/2016, 9:18 PM

## 2016-03-02 NOTE — Discharge Summary (Signed)
See final progress note. 

## 2016-03-07 ENCOUNTER — Encounter: Payer: Medicaid Other | Admitting: Obstetrics and Gynecology

## 2016-03-17 LAB — MATERNIT 21 PLUS CORE, BLOOD

## 2016-03-21 ENCOUNTER — Observation Stay
Admission: EM | Admit: 2016-03-21 | Discharge: 2016-03-21 | Disposition: A | Discharge status: Home / Self Care | Payer: Medicaid Other | Attending: Certified Nurse Midwife | Admitting: Certified Nurse Midwife

## 2016-03-21 ENCOUNTER — Other Ambulatory Visit (HOSPITAL_COMMUNITY)
Admission: RE | Admit: 2016-03-21 | Discharge: 2016-03-21 | Disposition: A | Discharge status: Home / Self Care | Payer: Medicaid Other | Source: Ambulatory Visit | Attending: Obstetrics & Gynecology | Admitting: Obstetrics & Gynecology

## 2016-03-21 ENCOUNTER — Ambulatory Visit (INDEPENDENT_AMBULATORY_CARE_PROVIDER_SITE_OTHER): Payer: Medicaid Other | Admitting: Obstetrics and Gynecology

## 2016-03-21 DIAGNOSIS — Z3480 Encounter for supervision of other normal pregnancy, unspecified trimester: Secondary | ICD-10-CM

## 2016-03-21 DIAGNOSIS — O23592 Infection of other part of genital tract in pregnancy, second trimester: Secondary | ICD-10-CM | POA: Diagnosis not present

## 2016-03-21 DIAGNOSIS — B3731 Acute candidiasis of vulva and vagina: Secondary | ICD-10-CM | POA: Diagnosis present

## 2016-03-21 DIAGNOSIS — Z3482 Encounter for supervision of other normal pregnancy, second trimester: Secondary | ICD-10-CM

## 2016-03-21 DIAGNOSIS — B373 Candidiasis of vulva and vagina: Secondary | ICD-10-CM | POA: Insufficient documentation

## 2016-03-21 DIAGNOSIS — Z3A26 26 weeks gestation of pregnancy: Secondary | ICD-10-CM | POA: Insufficient documentation

## 2016-03-21 DIAGNOSIS — O4290 Premature rupture of membranes, unspecified as to length of time between rupture and onset of labor, unspecified weeks of gestation: Secondary | ICD-10-CM | POA: Diagnosis present

## 2016-03-21 LAB — URINALYSIS, COMPLETE (UACMP) WITH MICROSCOPIC
Bilirubin Urine: NEGATIVE
Glucose, UA: NEGATIVE mg/dL
Hgb urine dipstick: NEGATIVE
Ketones, ur: NEGATIVE mg/dL
Leukocytes, UA: NEGATIVE
Nitrite: NEGATIVE
PH: 7 (ref 5.0–8.0)
Protein, ur: NEGATIVE mg/dL
SPECIFIC GRAVITY, URINE: 1.009 (ref 1.005–1.030)

## 2016-03-21 LAB — URINE DRUG SCREEN, QUALITATIVE (ARMC ONLY)
Amphetamines, Ur Screen: NOT DETECTED
Barbiturates, Ur Screen: NOT DETECTED
Benzodiazepine, Ur Scrn: NOT DETECTED
COCAINE METABOLITE, UR ~~LOC~~: NOT DETECTED
Cannabinoid 50 Ng, Ur ~~LOC~~: NOT DETECTED
MDMA (Ecstasy)Ur Screen: NOT DETECTED
METHADONE SCREEN, URINE: NOT DETECTED
OPIATE, UR SCREEN: NOT DETECTED
Phencyclidine (PCP) Ur S: NOT DETECTED
Tricyclic, Ur Screen: POSITIVE — AB

## 2016-03-21 MED ORDER — MICONAZOLE NITRATE 200 MG VA SUPP: 200.0000 mg | Freq: Every day | VAGINAL | 0 refills | Status: AC

## 2016-03-21 MED ORDER — MICONAZOLE NITRATE 200 MG VA SUPP: 200.0000 mg | Freq: Every day | VAGINAL | 0 refills | Status: DC

## 2016-03-21 MED ORDER — MICONAZOLE NITRATE 200 MG VA SUPP: 200.0000 mg | Freq: Every day | VAGINAL | Status: DC

## 2016-03-21 NOTE — Final Progress Note (Signed)
Physician Final Progress Note  Patient ID: Sarah Reese MRN: [id number] DOB/AGE: 05/17/1985 31 y.o.  Admit date: 03/21/2016 Admitting provider: Gae Dry, MD Discharge date: 03/21/2016   Admission Diagnoses: leakage of fluid  Discharge Diagnoses:  Active Problems:   Monilial vaginitis    Consults: none  Significant Findings/ Diagnostic Studies: 31 yo G3 P1011 with EDC=Jun 30, 2016 presents at 26 wk 2 d with c/o leakage of fluid. Was just seen at [facility] for this complaint and was told there was no evidence of SROM. A wet prep swab was done and sent to lab with results pending. Patient reports no contractions, bleeding, vulvar itching or foul odor to discharge. Patient is homeless and has a female partner who is also pregnant from the same man. Past Medical history positive for asthma. This is the third visit to [facility]  in the last month.  Exam: BP 119/71 (BP Location: Left Arm)   Pulse 91   Temp 98.5 F (36.9 C) (Oral)   Resp (!) 158   LMP 09/19/2015 (Exact Date)   General: in NAD FHR: 145 with accelerations to 160s, moderate variability Toco: some uterine irritability Vulva: inflamed vestibule, white curd like discharge at introitus Wet prep: positive for hyphae, neg for Trich, clue cells and Trich A: Monilia vaginitis P: Monistat 3 suppositories FU at [facility] as scheduled  Procedures: none  Discharge Condition: stable  Disposition: 01-Home or Self Care  Diet: Regular diet  Discharge Activity: Activity as tolerated   Allergies as of 03/21/2016   No Known Allergies     Medication List    TAKE these medications   miconazole 200 MG vaginal suppository Commonly known as:  MICOTIN Place 1 suppository (200 mg total) vaginally at bedtime.   Prenatal Vitamins 28-0.8 MG Tabs Take by mouth.        Total time spent taking care of this patient: 15 minutes  Signed: Dalia Heading 03/21/2016, 6:32 PM

## 2016-03-21 NOTE — Progress Notes (Signed)
Pt and partner given list of currently available hotels in the area based on stated ability of what they can afford to pay.

## 2016-03-21 NOTE — Discharge Instructions (Signed)

## 2016-03-21 NOTE — OB Triage Note (Signed)
Ms. Devitt here with c/o LOF, states first occurrence around 0900. Had clinic appt in [place] today, said "they did a swab" no evidence of rupture. Denies ctx, bleeding, reports positive fetal movement. Pt states she is homeless, has nowhere to stay tonight, her partner is with her, they are both pregnant with same FOB, planned pregnancies before losing housing.

## 2016-03-21 NOTE — Progress Notes (Signed)
   PRENATAL VISIT NOTE  Subjective:  Sarah Reese is a 31 y.o. G3P1011 at [redacted]w[redacted]d being seen today for ongoing prenatal care.  She is currently monitored for the following issues for this low-risk pregnancy and has Supervision of normal pregnancy, antepartum; Indication for care in labor and delivery, antepartum; and Decreased fetal movement affecting management of pregnancy in second trimester on her problem list.  Patient reports leakage of fluid since this am.  Patient missed several appointment. She reports being under a lot of stress.  Contractions: Not present. Vag. Bleeding: None.  Movement: Present. Denies leaking of fluid.   The following portions of the patient's history were reviewed and updated as appropriate: allergies, current medications, past family history, past medical history, past social history, past surgical history and problem list. Problem list updated.  Objective:   Vitals:   03/21/16 1504  BP: 114/70  Pulse: 84  Weight: 184 lb (83.5 kg)    Fetal Status: Fetal Heart Rate (bpm): 151 Fundal Height: 26 cm Movement: Present     General:  Alert, oriented and cooperative. Patient is in no acute distress.  Skin: Skin is warm and dry. No rash noted.   Cardiovascular: Normal heart rate noted  Respiratory: Normal respiratory effort, no problems with respiration noted  Abdomen: Soft, gravid, appropriate for gestational age. Pain/Pressure: Absent     Pelvic:  Cervical exam performed      no pooling, presence of thin white discharge  Extremities: Normal range of motion.  Edema: None  Mental Status: Normal mood and affect. Normal behavior. Normal judgment and thought content.   Assessment and Plan:  Pregnancy: G3P1011 at [redacted]w[redacted]d  1. Supervision of other normal pregnancy, antepartum Reassurance provided as there is no evidence of PPROM Wet prep collected Glucola and labs next visit Patient may consider [id number]  Preterm labor symptoms and general obstetric  precautions including but not limited to vaginal bleeding, contractions, leaking of fluid and fetal movement were reviewed in detail with the patient. Please refer to After Visit Summary for other counseling recommendations.  Return in about 2 weeks (around 04/04/2016) for ROB and 2 hr glucola.   Mora Bellman, MD

## 2016-03-21 NOTE — Progress Notes (Signed)
Patient is in the office, reports fetal movement, denies contractions and reports leaking fluid since this morning.

## 2016-03-22 LAB — CERVICOVAGINAL ANCILLARY ONLY
Bacterial vaginitis: NEGATIVE
CANDIDA VAGINITIS: POSITIVE — AB
TRICH (WINDOWPATH): NEGATIVE

## 2016-03-22 NOTE — Addendum Note (Signed)
Addended by: Mora Bellman on: 03/22/2016 07:52 AM   Modules accepted: Orders

## 2016-03-23 ENCOUNTER — Other Ambulatory Visit: Payer: Self-pay | Admitting: Obstetrics and Gynecology

## 2016-03-23 MED ORDER — FLUCONAZOLE 150 MG PO TABS: 150.0000 mg | ORAL_TABLET | Freq: Once | ORAL | 0 refills | Status: AC

## 2016-04-04 ENCOUNTER — Ambulatory Visit (INDEPENDENT_AMBULATORY_CARE_PROVIDER_SITE_OTHER): Payer: Medicaid Other | Admitting: Certified Nurse Midwife

## 2016-04-04 ENCOUNTER — Other Ambulatory Visit: Payer: Medicaid Other

## 2016-04-04 VITALS — BP 116/70 | HR 90 | Wt 181.0 lb

## 2016-04-04 DIAGNOSIS — Z3480 Encounter for supervision of other normal pregnancy, unspecified trimester: Secondary | ICD-10-CM

## 2016-04-04 DIAGNOSIS — K219 Gastro-esophageal reflux disease without esophagitis: Secondary | ICD-10-CM

## 2016-04-04 DIAGNOSIS — O99613 Diseases of the digestive system complicating pregnancy, third trimester: Secondary | ICD-10-CM

## 2016-04-04 DIAGNOSIS — R011 Cardiac murmur, unspecified: Secondary | ICD-10-CM

## 2016-04-04 DIAGNOSIS — O99619 Diseases of the digestive system complicating pregnancy, unspecified trimester: Secondary | ICD-10-CM

## 2016-04-04 MED ORDER — OMEPRAZOLE 20 MG PO CPDR: 20.0000 mg | DELAYED_RELEASE_CAPSULE | Freq: Every day | ORAL | 5 refills | Status: DC

## 2016-04-04 NOTE — Patient Instructions (Signed)
Third Trimester of Pregnancy The third trimester is from week 29 through week 40 (months 7 through 9). The third trimester is a time when the unborn baby (fetus) is growing rapidly. At the end of the ninth month, the fetus is about 20 inches in length and weighs 6-10 pounds. Body changes during your third trimester Your body goes through many changes during pregnancy. The changes vary from woman to woman. During the third trimester:  Your weight will continue to increase. You can expect to gain 25-35 pounds (11-16 kg) by the end of the pregnancy.  You may begin to get stretch marks on your hips, abdomen, and breasts.  You may urinate more often because the fetus is moving lower into your pelvis and pressing on your bladder.  You may develop or continue to have heartburn. This is caused by increased hormones that slow down muscles in the digestive tract.  You may develop or continue to have constipation because increased hormones slow digestion and cause the muscles that push waste through your intestines to relax.  You may develop hemorrhoids. These are swollen veins (varicose veins) in the rectum that can itch or be painful.  You may develop swollen, bulging veins (varicose veins) in your legs.  You may have increased body aches in the pelvis, back, or thighs. This is due to weight gain and increased hormones that are relaxing your joints.  You may have changes in your hair. These can include thickening of your hair, rapid growth, and changes in texture. Some women also have hair loss during or after pregnancy, or hair that feels dry or thin. Your hair will most likely return to normal after your baby is born.  Your breasts will continue to grow and they will continue to become tender. A yellow fluid (colostrum) may leak from your breasts. This is the first milk you are producing for your baby.  Your belly button may stick out.  You may notice more swelling in your hands, face, or  ankles.  You may have increased tingling or numbness in your hands, arms, and legs. The skin on your belly may also feel numb.  You may feel short of breath because of your expanding uterus.  You may have more problems sleeping. This can be caused by the size of your belly, increased need to urinate, and an increase in your body's metabolism.  You may notice the fetus "dropping," or moving lower in your abdomen.  You may have increased vaginal discharge.  Your cervix becomes thin and soft (effaced) near your due date. What to expect at prenatal visits You will have prenatal exams every 2 weeks until week 36. Then you will have weekly prenatal exams. During a routine prenatal visit:  You will be weighed to make sure you and the fetus are growing normally.  Your blood pressure will be taken.  Your abdomen will be measured to track your baby's growth.  The fetal heartbeat will be listened to.  Any test results from the previous visit will be discussed.  You may have a cervical check near your due date to see if you have effaced. At around 36 weeks, your health care provider will check your cervix. At the same time, your health care provider will also perform a test on the secretions of the vaginal tissue. This test is to determine if a type of bacteria, Group B streptococcus, is present. Your health care provider will explain this further. Your health care provider may ask you:    What your birth plan is.  How you are feeling.  If you are feeling the baby move.  If you have had any abnormal symptoms, such as leaking fluid, bleeding, severe headaches, or abdominal cramping.  If you are using any tobacco products, including cigarettes, chewing tobacco, and electronic cigarettes.  If you have any questions. Other tests or screenings that may be performed during your third trimester include:  Blood tests that check for low iron levels (anemia).  Fetal testing to check the health,  activity level, and growth of the fetus. Testing is done if you have certain medical conditions or if there are problems during the pregnancy.  Nonstress test (NST). This test checks the health of your baby to make sure there are no signs of problems, such as the baby not getting enough oxygen. During this test, a belt is placed around your belly. The baby is made to move, and its heart rate is monitored during movement. What is false labor? False labor is a condition in which you feel small, irregular tightenings of the muscles in the womb (contractions) that eventually go away. These are called Braxton Hicks contractions. Contractions may last for hours, days, or even weeks before true labor sets in. If contractions come at regular intervals, become more frequent, increase in intensity, or become painful, you should see your health care provider. What are the signs of labor?  Abdominal cramps.  Regular contractions that start at 10 minutes apart and become stronger and more frequent with time.  Contractions that start on the top of the uterus and spread down to the lower abdomen and back.  Increased pelvic pressure and dull back pain.  A watery or bloody mucus discharge that comes from the vagina.  Leaking of amniotic fluid. This is also known as your "water breaking." It could be a slow trickle or a gush. Let your doctor know if it has a color or strange odor. If you have any of these signs, call your health care provider right away, even if it is before your due date. Follow these instructions at home: Eating and drinking  Continue to eat regular, healthy meals.  Do not eat:  Raw meat or meat spreads.  Unpasteurized milk or cheese.  Unpasteurized juice.  Store-made salad.  Refrigerated smoked seafood.  Hot dogs or deli meat, unless they are piping hot.  More than 6 ounces of albacore tuna a week.  Shark, swordfish, king mackerel, or tile fish.  Store-made salads.  Raw  sprouts, such as mung bean or alfalfa sprouts.  Take prenatal vitamins as told by your health care provider.  Take 1000 mg of calcium daily as told by your health care provider.  If you develop constipation:  Take over-the-counter or prescription medicines.  Drink enough fluid to keep your urine clear or pale yellow.  Eat foods that are high in fiber, such as fresh fruits and vegetables, whole grains, and beans.  Limit foods that are high in fat and processed sugars, such as fried and sweet foods. Activity  Exercise only as directed by your health care provider. Healthy pregnant women should aim for 2 hours and 30 minutes of moderate exercise per week. If you experience any pain or discomfort while exercising, stop.  Avoid heavy lifting.  Do not exercise in extreme heat or humidity, or at high altitudes.  Wear low-heel, comfortable shoes.  Practice good posture.  Do not travel far distances unless it is absolutely necessary and only with the approval   of your health care provider.  Wear your seat belt at all times while in a car, on a bus, or on a plane.  Take frequent breaks and rest with your legs elevated if you have leg cramps or low back pain.  Do not use hot tubs, steam rooms, or saunas.  You may continue to have sex unless your health care provider tells you otherwise. Lifestyle  Do not use any products that contain nicotine or tobacco, such as cigarettes and e-cigarettes. If you need help quitting, ask your health care provider.  Do not drink alcohol.  Do not use any medicinal herbs or unprescribed drugs. These chemicals affect the formation and growth of the baby.  If you develop varicose veins:  Wear support pantyhose or compression stockings as told by your healthcare provider.  Elevate your feet for 15 minutes, 3-4 times a day.  Wear a supportive maternity bra to help with breast tenderness. General instructions  Take over-the-counter and prescription  medicines only as told by your health care provider. There are medicines that are either safe or unsafe to take during pregnancy.  Take warm sitz baths to soothe any pain or discomfort caused by hemorrhoids. Use hemorrhoid cream or witch hazel if your health care provider approves.  Avoid cat litter boxes and soil used by cats. These carry germs that can cause birth defects in the baby. If you have a cat, ask someone to clean the litter box for you.  To prepare for the arrival of your baby:  Take prenatal classes to understand, practice, and ask questions about the labor and delivery.  Make a trial run to the hospital.  Visit the hospital and tour the maternity area.  Arrange for maternity or paternity leave through employers.  Arrange for family and friends to take care of pets while you are in the hospital.  Purchase a rear-facing car seat and make sure you know how to install it in your car.  Pack your hospital bag.  Prepare the baby's nursery. Make sure to remove all pillows and stuffed animals from the baby's crib to prevent suffocation.  Visit your dentist if you have not gone during your pregnancy. Use a soft toothbrush to brush your teeth and be gentle when you floss.  Keep all prenatal follow-up visits as told by your health care provider. This is important. Contact a health care provider if:  You are unsure if you are in labor or if your water has broken.  You become dizzy.  You have mild pelvic cramps, pelvic pressure, or nagging pain in your abdominal area.  You have lower back pain.  You have persistent nausea, vomiting, or diarrhea.  You have an unusual or bad smelling vaginal discharge.  You have pain when you urinate. Get help right away if:  You have a fever.  You are leaking fluid from your vagina.  You have spotting or bleeding from your vagina.  You have severe abdominal pain or cramping.  You have rapid weight loss or weight gain.  You have  shortness of breath with chest pain.  You notice sudden or extreme swelling of your face, hands, ankles, feet, or legs.  Your baby makes fewer than 10 movements in 2 hours.  You have severe headaches that do not go away with medicine.  You have vision changes. Summary  The third trimester is from week 29 through week 40, months 7 through 9. The third trimester is a time when the unborn baby (fetus)   is growing rapidly.  During the third trimester, your discomfort may increase as you and your baby continue to gain weight. You may have abdominal, leg, and back pain, sleeping problems, and an increased need to urinate.  During the third trimester your breasts will keep growing and they will continue to become tender. A yellow fluid (colostrum) may leak from your breasts. This is the first milk you are producing for your baby.  False labor is a condition in which you feel small, irregular tightenings of the muscles in the womb (contractions) that eventually go away. These are called Braxton Hicks contractions. Contractions may last for hours, days, or even weeks before true labor sets in.  Signs of labor can include: abdominal cramps; regular contractions that start at 10 minutes apart and become stronger and more frequent with time; watery or bloody mucus discharge that comes from the vagina; increased pelvic pressure and dull back pain; and leaking of amniotic fluid. This information is not intended to replace advice given to you by your health care provider. Make sure you discuss any questions you have with your health care provider. Document Released: 02/01/2001 Document Revised: 07/16/2015 Document Reviewed: 04/10/2012 Elsevier Interactive Patient Education  2017 [employer]  

## 2016-04-04 NOTE — Progress Notes (Signed)
   PRENATAL VISIT NOTE  Subjective:  Sarah Reese is a 31 y.o. G3P1011 at [redacted]w[redacted]d being seen today for ongoing prenatal care.  She is currently monitored for the following issues for this low-risk pregnancy and has Supervision of normal pregnancy, antepartum and Cardiac murmur on her problem list.  Patient reports no complaints.  Contractions: Not present. Vag. Bleeding: None.  Movement: Present. Denies leaking of fluid.   The following portions of the patient's history were reviewed and updated as appropriate: allergies, current medications, past family history, past medical history, past social history, past surgical history and problem list. Problem list updated.  Objective:   Vitals:   04/04/16 0958  BP: 116/70  Pulse: 90  Weight: 181 lb (82.1 kg)    Fetal Status: Fetal Heart Rate (bpm): 145 Fundal Height: 28 cm Movement: Present     General:  Alert, oriented and cooperative. Patient is in no acute distress.  Skin: Skin is warm and dry. No rash noted.   Cardiovascular: Normal heart rate noted  Respiratory: Normal respiratory effort, no problems with respiration noted  Abdomen: Soft, gravid, appropriate for gestational age. Pain/Pressure: Absent     Pelvic:  Cervical exam deferred        Extremities: Normal range of motion.     Mental Status: Normal mood and affect. Normal behavior. Normal judgment and thought content.   Assessment and Plan:  Pregnancy: G3P1011 at [redacted]w[redacted]d  1. Supervision of other normal pregnancy, antepartum    - Glucose Tolerance, 2 Hours w/1 Hour - CBC - HIV antibody - RPR   2. Cardiac murmur  - Ambulatory referral to Cardiology  3. Gastroesophageal reflux in pregnancy    OTC tums - omeprazole (PRILOSEC) 20 MG capsule; Take 1 capsule (20 mg total) by mouth daily.  Dispense: 60 capsule; Refill: 5  Preterm labor symptoms and general obstetric precautions including but not limited to vaginal bleeding, contractions, leaking of fluid and fetal  movement were reviewed in detail with the patient. Please refer to After Visit Summary for other counseling recommendations.  Return in about 2 weeks (around 04/18/2016) for [place].   Morene Crocker, CNM

## 2016-04-04 NOTE — Progress Notes (Signed)
Pt states she is having chest pain x month, increasing over last week. Pt states she was told she has heart murmur with pregnancy.  Pt states she was told to see cardiac, no appt has been scheduled. Pt states that she also has heartburn/indigestion,would like Rx. Pt would like to discuss weight gain. Pt states she would like to get Tdap at next visit.

## 2016-04-04 NOTE — Addendum Note (Signed)
Addended by: Bettye Boeck on: 04/04/2016 05:26 PM   Modules accepted: Orders

## 2016-04-05 LAB — CBC
HEMATOCRIT: 30.4 % — AB (ref 34.0–46.6)
Hemoglobin: 10.4 g/dL — ABNORMAL LOW (ref 11.1–15.9)
MCH: 30.6 pg (ref 26.6–33.0)
MCHC: 34.2 g/dL (ref 31.5–35.7)
MCV: 89 fL (ref 79–97)
Platelets: 179 10*3/uL (ref 150–379)
RBC: 3.4 x10E6/uL — ABNORMAL LOW (ref 3.77–5.28)
RDW: 13.6 % (ref 12.3–15.4)
WBC: 5.5 10*3/uL (ref 3.4–10.8)

## 2016-04-05 LAB — RPR: RPR Ser Ql: NONREACTIVE

## 2016-04-05 LAB — GLUCOSE TOLERANCE, 2 HOURS W/ 1HR
GLUCOSE, 1 HOUR: 91 mg/dL (ref 65–179)
GLUCOSE, 2 HOUR: 95 mg/dL (ref 65–152)
Glucose, Fasting: 74 mg/dL (ref 65–91)

## 2016-04-05 LAB — HIV ANTIBODY (ROUTINE TESTING W REFLEX): HIV SCREEN 4TH GENERATION: NONREACTIVE

## 2016-04-06 ENCOUNTER — Telehealth: Payer: Self-pay

## 2016-04-06 ENCOUNTER — Other Ambulatory Visit: Payer: Self-pay | Admitting: Certified Nurse Midwife

## 2016-04-06 DIAGNOSIS — O99019 Anemia complicating pregnancy, unspecified trimester: Secondary | ICD-10-CM | POA: Insufficient documentation

## 2016-04-06 DIAGNOSIS — Z3480 Encounter for supervision of other normal pregnancy, unspecified trimester: Secondary | ICD-10-CM

## 2016-04-06 NOTE — Telephone Encounter (Signed)
Patient called in wanting to go over lab results.

## 2016-04-11 ENCOUNTER — Other Ambulatory Visit: Payer: Self-pay | Admitting: Certified Nurse Midwife

## 2016-04-11 DIAGNOSIS — Z3480 Encounter for supervision of other normal pregnancy, unspecified trimester: Secondary | ICD-10-CM

## 2016-04-11 LAB — MATERNIT21 PLUS CORE+SCA
CHROMOSOME 13: NEGATIVE
CHROMOSOME 18: NEGATIVE
CHROMOSOME 21: NEGATIVE
Y CHROMOSOME: DETECTED

## 2016-04-15 NOTE — Progress Notes (Deleted)
   Cardiology Office Note    Date:  04/15/2016   ID:  Sarah Reese, DOB 27-Nov-1985, MRN [id number]  PCP:  No PCP Per Patient  Cardiologist:  New   CC: chest pain and heart murmur  History of Present Illness:  Sarah Reese is a 31 y.o. female with a history of currently pregnant, GERD, chest pain and cardiac mumur who presents to clinic for new patient evaluation.    Past Medical History:  Diagnosis Date  . Asthma     Past Surgical History:  Procedure Laterality Date  . DILATION AND CURETTAGE OF UTERUS    . WISDOM TOOTH EXTRACTION      Current Medications: Outpatient Medications Prior to Visit  Medication Sig Dispense Refill  . omeprazole (PRILOSEC) 20 MG capsule Take 1 capsule (20 mg total) by mouth daily. 60 capsule 5  . Prenatal Vit-Fe Fumarate-FA (PRENATAL VITAMINS) 28-0.8 MG TABS Take by mouth.     No facility-administered medications prior to visit.      Allergies:   Patient has no known allergies.   Social History   Social History  . Marital status: Single    Spouse name: N/A  . Number of children: N/A  . Years of education: N/A   Social History Main Topics  . Smoking status: Never Smoker  . Smokeless tobacco: Never Used  . Alcohol use No  . Drug use: No  . Sexual activity: Yes    Birth control/ protection: None     Comment: same-sex relationship at this time   Other Topics Concern  . Not on file   Social History Narrative   ** Merged History Encounter **         Family History:  The patient's ***family history includes Hypertension in her maternal grandmother and mother.      *** ROS/PE    Wt Readings from Last 3 Encounters:  04/04/16 181 lb (82.1 kg)  03/21/16 184 lb (83.5 kg)  02/23/16 162 lb (73.5 kg)      Studies/Labs Reviewed:   EKG:  EKG is*** ordered today.  The ekg ordered today demonstrates ***  Recent Labs: 02/03/2016: ALT 14; BUN 6; Creatinine, Ser 0.52; Hemoglobin 10.8; Potassium 3.2; Sodium  134 04/04/2016: Platelets 179   Lipid Panel No results found for: CHOL, TRIG, HDL, CHOLHDL, VLDL, LDLCALC, LDLDIRECT  Additional studies/ records that were reviewed today include:  none   ASSESSMENT & PLAN:   Chest pain:  Murmur:    Medication Adjustments/Labs and Tests Ordered: Current medicines are reviewed at length with the patient today.  Concerns regarding medicines are outlined above.  Medication changes, Labs and Tests ordered today are listed in the Patient Instructions below. There are no Patient Instructions on file for this visit.   Signed, Angelena Form, PA-C  04/15/2016 4:45 PM    [facility] [address], [place], [place]  [address] Phone: [phone]; Fax: [phone]

## 2016-04-18 ENCOUNTER — Ambulatory Visit (INDEPENDENT_AMBULATORY_CARE_PROVIDER_SITE_OTHER): Payer: Medicaid Other | Admitting: Certified Nurse Midwife

## 2016-04-18 VITALS — BP 105/61 | HR 91 | Wt 185.0 lb

## 2016-04-18 DIAGNOSIS — O99013 Anemia complicating pregnancy, third trimester: Secondary | ICD-10-CM

## 2016-04-18 DIAGNOSIS — R011 Cardiac murmur, unspecified: Secondary | ICD-10-CM

## 2016-04-18 DIAGNOSIS — D649 Anemia, unspecified: Secondary | ICD-10-CM

## 2016-04-18 DIAGNOSIS — O99019 Anemia complicating pregnancy, unspecified trimester: Secondary | ICD-10-CM

## 2016-04-18 DIAGNOSIS — Z3480 Encounter for supervision of other normal pregnancy, unspecified trimester: Secondary | ICD-10-CM

## 2016-04-18 NOTE — Progress Notes (Signed)
   PRENATAL VISIT NOTE  Subjective:  Sarah Reese is a 31 y.o. G3P1011 at [redacted]w[redacted]d being seen today for ongoing prenatal care.  She is currently monitored for the following issues for this low-risk pregnancy and has Supervision of normal pregnancy, antepartum; Cardiac murmur; and Anemia during pregnancy on her problem list.  Patient reports no complaints.  Contractions: Not present. Vag. Bleeding: None.  Movement: Present. Denies leaking of fluid.   The following portions of the patient's history were reviewed and updated as appropriate: allergies, current medications, past family history, past medical history, past social history, past surgical history and problem list. Problem list updated.  Objective:   Vitals:   04/18/16 1123  BP: 105/61  Pulse: 91  Weight: 185 lb (83.9 kg)    Fetal Status: Fetal Heart Rate (bpm): 144 Fundal Height: 30 cm Movement: Present     General:  Alert, oriented and cooperative. Patient is in no acute distress.  Skin: Skin is warm and dry. No rash noted.   Cardiovascular: Normal heart rate noted  Respiratory: Normal respiratory effort, no problems with respiration noted  Abdomen: Soft, gravid, appropriate for gestational age. Pain/Pressure: Present     Pelvic:  Cervical exam deferred        Extremities: Normal range of motion.     Mental Status: Normal mood and affect. Normal behavior. Normal judgment and thought content.   Assessment and Plan:  Pregnancy: G3P1011 at [redacted]w[redacted]d  1. Supervision of other normal pregnancy, antepartum    Doing well  2. Cardiac murmur     Cardiology consult was placed.   3. Anemia during pregnancy     OTC iron and colace recommended.   Preterm labor symptoms and general obstetric precautions including but not limited to vaginal bleeding, contractions, leaking of fluid and fetal movement were reviewed in detail with the patient. Please refer to After Visit Summary for other counseling recommendations.  Return in  about 2 weeks (around 05/02/2016) for [place].   Morene Crocker, CNM

## 2016-04-20 ENCOUNTER — Ambulatory Visit: Payer: Medicaid Other | Admitting: Physician Assistant

## 2016-04-21 ENCOUNTER — Observation Stay
Admission: EM | Admit: 2016-04-21 | Discharge: 2016-04-21 | Disposition: A | Discharge status: Home / Self Care | Payer: Medicaid Other | Attending: Obstetrics and Gynecology | Admitting: Obstetrics and Gynecology

## 2016-04-21 DIAGNOSIS — Z3480 Encounter for supervision of other normal pregnancy, unspecified trimester: Secondary | ICD-10-CM

## 2016-04-21 DIAGNOSIS — O4703 False labor before 37 completed weeks of gestation, third trimester: Principal | ICD-10-CM | POA: Insufficient documentation

## 2016-04-21 DIAGNOSIS — R011 Cardiac murmur, unspecified: Secondary | ICD-10-CM | POA: Insufficient documentation

## 2016-04-21 DIAGNOSIS — O99013 Anemia complicating pregnancy, third trimester: Secondary | ICD-10-CM | POA: Insufficient documentation

## 2016-04-21 DIAGNOSIS — O26893 Other specified pregnancy related conditions, third trimester: Secondary | ICD-10-CM | POA: Insufficient documentation

## 2016-04-21 DIAGNOSIS — Z3A30 30 weeks gestation of pregnancy: Secondary | ICD-10-CM | POA: Insufficient documentation

## 2016-04-21 HISTORY — DX: Anemia, unspecified: D64.9

## 2016-04-21 HISTORY — DX: Cardiac murmur, unspecified: R01.1

## 2016-04-21 LAB — URINALYSIS, ROUTINE W REFLEX MICROSCOPIC
BILIRUBIN URINE: NEGATIVE
Glucose, UA: NEGATIVE mg/dL
Hgb urine dipstick: NEGATIVE
KETONES UR: NEGATIVE mg/dL
Leukocytes, UA: NEGATIVE
NITRITE: NEGATIVE
Protein, ur: NEGATIVE mg/dL
Specific Gravity, Urine: 1.012 (ref 1.005–1.030)
pH: 6 (ref 5.0–8.0)

## 2016-04-21 LAB — FETAL FIBRONECTIN: FETAL FIBRONECTIN: NEGATIVE

## 2016-04-21 MED ORDER — DOCUSATE SODIUM 100 MG PO CAPS: 100.0000 mg | ORAL_CAPSULE | Freq: Every day | ORAL | Status: DC

## 2016-04-21 MED ORDER — ACETAMINOPHEN 325 MG PO TABS: 650.0000 mg | ORAL_TABLET | ORAL | Status: DC | PRN

## 2016-04-21 MED ORDER — PRENATAL MULTIVITAMIN CH
1.0000 | ORAL_TABLET | Freq: Every day | ORAL | Status: DC
Start: 2016-04-22 — End: 2016-04-21

## 2016-04-21 MED ORDER — CALCIUM CARBONATE ANTACID 500 MG PO CHEW: 2.0000 | CHEWABLE_TABLET | ORAL | Status: DC | PRN

## 2016-04-21 MED ORDER — ZOLPIDEM TARTRATE 5 MG PO TABS: 5.0000 mg | ORAL_TABLET | Freq: Every evening | ORAL | Status: DC | PRN

## 2016-04-21 NOTE — Discharge Instructions (Signed)
Drink plenty of fluids and get rest. Call your provider with any other questions.

## 2016-04-21 NOTE — OB Triage Note (Signed)
[id number] [redacted]w[redacted]d complains of contractions since 1400 on 04/21/16 and states she has had three contractions within an hour and a half. States when she has one it gets to a pain of 6/10 in lower abdomen, but currently has no pain. Pt states she has thick, clear vaginal discharge. Pt states she gets prenatal care at [facility]. Pt states + FM and denies LOF. VSS.

## 2016-04-21 NOTE — Final Progress Note (Signed)
TRIAGE NOTE to rule out Preterm Labor   History of Present Illness: Sarah Reese is a 31 y.o. G3P1011 at [redacted]w[redacted]d presenting to triage for rule out preterm labor. Pt reports contractions since 1400 today every 30 min or so   Patient reports the fetal movement as active. Patient reports uterine contraction  activity as as above. Patient reports  vaginal bleeding as none. Patient describes fluid per vagina as None. Fetal presentation is unsure.  Patient Active Problem List   Diagnosis Date Noted  . Indication for care in labor or delivery 04/21/2016  . Anemia during pregnancy 04/06/2016  . Cardiac murmur 04/04/2016  . Supervision of normal pregnancy, antepartum 12/02/2015    Past Medical History:  Diagnosis Date  . Anemia   . Asthma   . Heart murmur    since pregnancy    Past Surgical History:  Procedure Laterality Date  . DILATION AND CURETTAGE OF UTERUS    . WISDOM TOOTH EXTRACTION      OB History  Gravida Para Term Preterm AB Living  3 1 1  0 1 1  SAB TAB Ectopic Multiple Live Births  1 0 0 0 1    # Outcome Date GA Lbr Len/2nd Weight Sex Delivery Anes PTL Lv  3 Current           2 Term 09/22/09 [redacted]w[redacted]d    Vag-Spont   LIV  1 SAB               Social History   Social History  . Marital status: Single    Spouse name: N/A  . Number of children: N/A  . Years of education: N/A   Social History Main Topics  . Smoking status: Never Smoker  . Smokeless tobacco: Never Used  . Alcohol use No  . Drug use: No  . Sexual activity: Yes    Birth control/ protection: None     Comment: same-sex relationship at this time   Other Topics Concern  . None   Social History Narrative   ** Merged History Encounter **        Family History  Problem Relation Age of Onset  . Hypertension Mother   . Hypertension Maternal Grandmother     No Known Allergies  Prescriptions Prior to Admission  Medication Sig Dispense Refill Last Dose  . ferrous sulfate 325 (65 FE)  MG EC tablet Take 325 mg by mouth 3 (three) times daily with meals.   04/20/2016 at Unknown time  . Prenatal Vit-Fe Fumarate-FA (PRENATAL VITAMINS) 28-0.8 MG TABS Take by mouth.   04/20/2016 at Unknown time  . omeprazole (PRILOSEC) 20 MG capsule Take 1 capsule (20 mg total) by mouth daily. (Patient not taking: Reported on 04/21/2016) 60 capsule 5 Not Taking at Unknown time    Review of Systems - See HPI for OB specific ROS.   Vitals:  BP 124/75 (BP Location: Left Arm)   Pulse (!) 101   Temp 98.1 F (36.7 C) (Oral)   Resp 18   Ht 5\' 5"  (1.651 m)   Wt 183 lb (83 kg)   LMP 09/19/2015 (Exact Date)   BMI 30.45 kg/m  Physical Examination: CONSTITUTIONAL: Well-developed, well-nourished female in no acute distress.  HENT:  Normocephalic, atraumatic EYES: Conjunctivae and EOM are normal. No scleral icterus.  NECK: Normal range of motion, supple, SKIN: Skin is warm and dry. No rash noted. Not diaphoretic. No erythema. No pallor. [place]: Alert and oriented to person, place, and time. No gross  cranial nerve deficit noted. PSYCHIATRIC: Normal mood and affect. Normal behavior. Normal judgment and thought content. CARDIOVASCULAR: Normal heart rate noted, regular rhythm RESPIRATORY: Effort and breath sounds normal, no problems with respiration noted ABDOMEN: Soft, nontender, nondistended, gravid.  Cervix: closed/thick/long Membranes:intact Fetal Monitoring:Baseline: 140 bpm, Variability: Good {> 6 bpm), Accelerations: Reactive and Decelerations: Absent Tocometer: Flat with occasional irritability  Labs:  Results for orders placed or performed during the hospital encounter of 04/21/16 (from the past 24 hour(s))  Urinalysis, Routine w reflex microscopic   Collection Time: 04/21/16  4:44 PM  Result Value Ref Range   Color, Urine YELLOW (A) YELLOW   APPearance CLEAR (A) CLEAR   Specific Gravity, Urine 1.012 1.005 - 1.030   pH 6.0 5.0 - 8.0   Glucose, UA NEGATIVE NEGATIVE mg/dL   Hgb urine  dipstick NEGATIVE NEGATIVE   Bilirubin Urine NEGATIVE NEGATIVE   Ketones, ur NEGATIVE NEGATIVE mg/dL   Protein, ur NEGATIVE NEGATIVE mg/dL   Nitrite NEGATIVE NEGATIVE   Leukocytes, UA NEGATIVE NEGATIVE  Fetal fibronectin   Collection Time: 04/21/16  4:44 PM  Result Value Ref Range   Fetal Fibronectin NEGATIVE NEGATIVE   Appearance, FETFIB CLEAR CLEAR    Imaging Studies: No results found.   Assessment and Plan: Patient Active Problem List   Diagnosis Date Noted  . Indication for care in labor or delivery 04/21/2016  . Anemia during pregnancy 04/06/2016  . Cardiac murmur 04/04/2016  . Supervision of normal pregnancy, antepartum 12/02/2015    1. Concern for preterm labor:  - Labs FFN, wet prep, urinalysis all negative. - Continue fetal monitoring reassuring - Continuous toco negative 2. Cervix closed 3. D/C home for f/u with cardiology tomorrow for flow murmur in pregnancy. F/U with primary obgyn as scheduled.  Angelina Pih, MD, MPH

## 2016-04-25 ENCOUNTER — Encounter: Payer: Self-pay | Admitting: *Deleted

## 2016-04-25 ENCOUNTER — Ambulatory Visit: Payer: Medicaid Other | Admitting: Physician Assistant

## 2016-04-25 NOTE — Progress Notes (Deleted)
Cardiology Office Note    Date:  04/25/2016   ID:  Sarah Reese, DOB 1985/10/24, MRN [id number]  PCP:  No PCP Per Patient  Cardiologist:  ***   No chief complaint on file.   History of Present Illness:  Sarah Reese is a 31 y.o. female with PMH of asthma who is currently [redacted] weeks pregnant Recently went to hospital for possibility of preterm labor. FFN and, wet prep and the urinalysis all negative for preterm delivery. Patient was reassured, however referred to cardiology service for a flow murmur.  Yes EKG  Past Medical History:  Diagnosis Date  . Anemia   . Asthma   . Heart murmur    since pregnancy    Past Surgical History:  Procedure Laterality Date  . DILATION AND CURETTAGE OF UTERUS    . WISDOM TOOTH EXTRACTION      Current Medications: Outpatient Medications Prior to Visit  Medication Sig Dispense Refill  . ferrous sulfate 325 (65 FE) MG EC tablet Take 325 mg by mouth 3 (three) times daily with meals.    Marland Kitchen omeprazole (PRILOSEC) 20 MG capsule Take 1 capsule (20 mg total) by mouth daily. (Patient not taking: Reported on 04/21/2016) 60 capsule 5  . Prenatal Vit-Fe Fumarate-FA (PRENATAL VITAMINS) 28-0.8 MG TABS Take by mouth.     No facility-administered medications prior to visit.      Allergies:   Patient has no known allergies.   Social History   Social History  . Marital status: Single    Spouse name: N/A  . Number of children: N/A  . Years of education: N/A   Social History Main Topics  . Smoking status: Never Smoker  . Smokeless tobacco: Never Used  . Alcohol use No  . Drug use: No  . Sexual activity: Yes    Birth control/ protection: None     Comment: same-sex relationship at this time   Other Topics Concern  . Not on file   Social History Narrative   ** Merged History Encounter **         Family History:  The patient's ***family history includes Hypertension in her maternal grandmother and mother.   ROS:   Please see  the history of present illness.    ROS All other systems reviewed and are negative.   PHYSICAL EXAM:   VS:  LMP 09/19/2015 (Exact Date)    GEN: Well nourished, well developed, in no acute distress  HEENT: normal  Neck: no JVD, carotid bruits, or masses Cardiac: ***RRR; no murmurs, rubs, or gallops,no edema  Respiratory:  clear to auscultation bilaterally, normal work of breathing GI: soft, nontender, nondistended, + BS MS: no deformity or atrophy  Skin: warm and dry, no rash Neuro:  Alert and Oriented x 3, Strength and sensation are intact Psych: euthymic mood, full affect  Wt Readings from Last 3 Encounters:  04/21/16 183 lb (83 kg)  04/18/16 185 lb (83.9 kg)  04/04/16 181 lb (82.1 kg)      Studies/Labs Reviewed:   EKG:  EKG is*** ordered today.  The ekg ordered today demonstrates ***  Recent Labs: 02/03/2016: ALT 14; BUN 6; Creatinine, Ser 0.52; Hemoglobin 10.8; Potassium 3.2; Sodium 134 04/04/2016: Platelets 179   Lipid Panel No results found for: CHOL, TRIG, HDL, CHOLHDL, VLDL, LDLCALC, LDLDIRECT  Additional studies/ records that were reviewed today include:  ***    ASSESSMENT:    No diagnosis found.   PLAN:  In order of problems listed  above:  1. ***    Medication Adjustments/Labs and Tests Ordered: Current medicines are reviewed at length with the patient today.  Concerns regarding medicines are outlined above.  Medication changes, Labs and Tests ordered today are listed in the Patient Instructions below. There are no Patient Instructions on file for this visit.   Hilbert Corrigan, [place]  04/25/2016 6:38 AM    [address] [place], [place], [place]  [address] Phone: [phone]; Fax: [phone]

## 2016-04-26 ENCOUNTER — Ambulatory Visit: Payer: Medicaid Other | Admitting: Cardiology

## 2016-05-02 ENCOUNTER — Encounter: Payer: Medicaid Other | Admitting: Certified Nurse Midwife

## 2016-05-09 ENCOUNTER — Ambulatory Visit (INDEPENDENT_AMBULATORY_CARE_PROVIDER_SITE_OTHER): Payer: Medicaid Other | Admitting: Certified Nurse Midwife

## 2016-05-09 VITALS — BP 122/73 | HR 95 | Temp 97.8°F | Wt 190.2 lb

## 2016-05-09 DIAGNOSIS — Z3483 Encounter for supervision of other normal pregnancy, third trimester: Secondary | ICD-10-CM

## 2016-05-09 DIAGNOSIS — Z3480 Encounter for supervision of other normal pregnancy, unspecified trimester: Secondary | ICD-10-CM

## 2016-05-09 DIAGNOSIS — J301 Allergic rhinitis due to pollen: Secondary | ICD-10-CM

## 2016-05-09 NOTE — Progress Notes (Signed)
Patient denies contractions, reports good fetal movement.

## 2016-05-09 NOTE — Progress Notes (Signed)
   PRENATAL VISIT NOTE  Subjective:  Sarah Reese is a 31 y.o. G3P1011 at [redacted]w[redacted]d being seen today for ongoing prenatal care.  She is currently monitored for the following issues for this low-risk pregnancy and has Supervision of normal pregnancy, antepartum; Cardiac murmur; Anemia during pregnancy; and Indication for care in labor or delivery on her problem list.  Patient reports no complaints.  Contractions: Not present. Vag. Bleeding: None.  Movement: Present. Denies leaking of fluid.   The following portions of the patient's history were reviewed and updated as appropriate: allergies, current medications, past family history, past medical history, past social history, past surgical history and problem list. Problem list updated.  Objective:   Vitals:   05/09/16 1501  BP: 122/73  Pulse: 95  Temp: 97.8 F (36.6 C)  Weight: 190 lb 3.2 oz (86.3 kg)    Fetal Status: Fetal Heart Rate (bpm): 142 Fundal Height: 33 cm Movement: Present     General:  Alert, oriented and cooperative. Patient is in no acute distress.  Skin: Skin is warm and dry. No rash noted.   Cardiovascular: Normal heart rate noted  Respiratory: Normal respiratory effort, no problems with respiration noted  Abdomen: Soft, gravid, appropriate for gestational age. Pain/Pressure: Present     Pelvic:  Cervical exam deferred        Extremities: Normal range of motion.  Edema: Trace  Mental Status: Normal mood and affect. Normal behavior. Normal judgment and thought content.   Assessment and Plan:  Pregnancy: G3P1011 at [redacted]w[redacted]d  1. Supervision of other normal pregnancy, antepartum     Doing well  2. Chronic seasonal allergic rhinitis due to pollen      - Ambulatory referral to Allergy  Preterm labor symptoms and general obstetric precautions including but not limited to vaginal bleeding, contractions, leaking of fluid and fetal movement were reviewed in detail with the patient. Please refer to After Visit  Summary for other counseling recommendations.  Return in about 2 weeks (around 05/23/2016) for ROB, GBS.   Morene Crocker, CNM

## 2016-05-23 ENCOUNTER — Ambulatory Visit (INDEPENDENT_AMBULATORY_CARE_PROVIDER_SITE_OTHER): Payer: Medicaid Other | Admitting: Certified Nurse Midwife

## 2016-05-23 ENCOUNTER — Encounter: Payer: Self-pay | Admitting: Certified Nurse Midwife

## 2016-05-23 ENCOUNTER — Other Ambulatory Visit (HOSPITAL_COMMUNITY)
Admission: RE | Admit: 2016-05-23 | Discharge: 2016-05-23 | Disposition: A | Discharge status: Home / Self Care | Payer: Medicaid Other | Source: Ambulatory Visit | Attending: Certified Nurse Midwife | Admitting: Certified Nurse Midwife

## 2016-05-23 VITALS — BP 123/73 | HR 109 | Wt 192.6 lb

## 2016-05-23 DIAGNOSIS — Z3A35 35 weeks gestation of pregnancy: Secondary | ICD-10-CM | POA: Insufficient documentation

## 2016-05-23 DIAGNOSIS — Z3403 Encounter for supervision of normal first pregnancy, third trimester: Secondary | ICD-10-CM | POA: Insufficient documentation

## 2016-05-23 DIAGNOSIS — O99019 Anemia complicating pregnancy, unspecified trimester: Secondary | ICD-10-CM

## 2016-05-23 DIAGNOSIS — O99013 Anemia complicating pregnancy, third trimester: Secondary | ICD-10-CM

## 2016-05-23 DIAGNOSIS — Z3400 Encounter for supervision of normal first pregnancy, unspecified trimester: Secondary | ICD-10-CM

## 2016-05-23 DIAGNOSIS — D649 Anemia, unspecified: Secondary | ICD-10-CM

## 2016-05-23 LAB — OB RESULTS CONSOLE GBS: STREP GROUP B AG: POSITIVE

## 2016-05-23 NOTE — Progress Notes (Signed)
   PRENATAL VISIT NOTE  Subjective:  Sarah Reese is a 31 y.o. G3P1011 at [redacted]w[redacted]d being seen today for ongoing prenatal care.  She is currently monitored for the following issues for this low-risk pregnancy and has Supervision of normal pregnancy, antepartum; Cardiac murmur; and Anemia during pregnancy on her problem list.  Patient reports no complaints.  Contractions: Not present. Vag. Bleeding: None.  Movement: Present. Denies leaking of fluid.   The following portions of the patient's history were reviewed and updated as appropriate: allergies, current medications, past family history, past medical history, past social history, past surgical history and problem list. Problem list updated.  Objective:   Vitals:   05/23/16 1330  BP: 123/73  Pulse: (!) 109  Weight: 192 lb 9.6 oz (87.4 kg)    Fetal Status: Fetal Heart Rate (bpm): 148 Fundal Height: 35 cm Movement: Present     General:  Alert, oriented and cooperative. Patient is in no acute distress.  Skin: Skin is warm and dry. No rash noted.   Cardiovascular: Normal heart rate noted  Respiratory: Normal respiratory effort, no problems with respiration noted  Abdomen: Soft, gravid, appropriate for gestational age. Pain/Pressure: Absent     Pelvic:  Cervical exam performed Dilation: Closed Effacement (%): 0 Station: Ballotable  Extremities: Normal range of motion.  Edema: None  Mental Status: Normal mood and affect. Normal behavior. Normal judgment and thought content.   Assessment and Plan:  Pregnancy: G3P1011 at [redacted]w[redacted]d  1. Supervision of normal first pregnancy, antepartum     Doing well - Strep Gp B NAA - Cervicovaginal ancillary only  2. Anemia during pregnancy     Taking OTC iron/colace.  Preterm labor symptoms and general obstetric precautions including but not limited to vaginal bleeding, contractions, leaking of fluid and fetal movement were reviewed in detail with the patient. Please refer to After Visit  Summary for other counseling recommendations.  Return in about 1 week (around 05/30/2016) for [place].   Morene Crocker, CNM

## 2016-05-23 NOTE — Progress Notes (Signed)
Patient reports she is doing well 

## 2016-05-24 LAB — CERVICOVAGINAL ANCILLARY ONLY
Bacterial vaginitis: NEGATIVE
Candida vaginitis: NEGATIVE
Chlamydia: NEGATIVE
Neisseria Gonorrhea: NEGATIVE
Trichomonas: NEGATIVE

## 2016-05-25 ENCOUNTER — Observation Stay
Admission: EM | Admit: 2016-05-25 | Discharge: 2016-05-25 | Disposition: A | Discharge status: Home / Self Care | Payer: Medicaid Other | Attending: Obstetrics and Gynecology | Admitting: Obstetrics and Gynecology

## 2016-05-25 ENCOUNTER — Encounter: Payer: Self-pay | Admitting: *Deleted

## 2016-05-25 DIAGNOSIS — S0081XA Abrasion of other part of head, initial encounter: Secondary | ICD-10-CM | POA: Insufficient documentation

## 2016-05-25 DIAGNOSIS — Z3480 Encounter for supervision of other normal pregnancy, unspecified trimester: Secondary | ICD-10-CM

## 2016-05-25 DIAGNOSIS — Z0379 Encounter for other suspected maternal and fetal conditions ruled out: Secondary | ICD-10-CM

## 2016-05-25 LAB — STREP GP B NAA: Strep Gp B NAA: POSITIVE — AB

## 2016-05-25 MED ORDER — ACETAMINOPHEN 325 MG PO TABS: 650.0000 mg | ORAL_TABLET | ORAL | Status: DC | PRN

## 2016-05-25 NOTE — Discharge Summary (Signed)
Pt d/c'd to home by self in stable condition. Pt states she feels safe returning to home environment. Educated on Fetal Kick counts and preterm labor, verbalized understanding.

## 2016-05-25 NOTE — Discharge Summary (Signed)
See final progress ntoe
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
## 2016-05-26 ENCOUNTER — Other Ambulatory Visit: Payer: Self-pay | Admitting: Certified Nurse Midwife

## 2016-05-26 DIAGNOSIS — O99820 Streptococcus B carrier state complicating pregnancy: Secondary | ICD-10-CM | POA: Insufficient documentation

## 2016-05-26 DIAGNOSIS — Z3480 Encounter for supervision of other normal pregnancy, unspecified trimester: Secondary | ICD-10-CM

## 2016-05-30 ENCOUNTER — Ambulatory Visit (INDEPENDENT_AMBULATORY_CARE_PROVIDER_SITE_OTHER): Payer: Medicaid Other | Admitting: Certified Nurse Midwife

## 2016-05-30 VITALS — BP 115/74 | HR 101 | Wt 193.6 lb

## 2016-05-30 DIAGNOSIS — O99019 Anemia complicating pregnancy, unspecified trimester: Secondary | ICD-10-CM

## 2016-05-30 DIAGNOSIS — Z3483 Encounter for supervision of other normal pregnancy, third trimester: Secondary | ICD-10-CM

## 2016-05-30 DIAGNOSIS — D649 Anemia, unspecified: Secondary | ICD-10-CM

## 2016-05-30 DIAGNOSIS — O99013 Anemia complicating pregnancy, third trimester: Secondary | ICD-10-CM

## 2016-05-30 DIAGNOSIS — O99820 Streptococcus B carrier state complicating pregnancy: Secondary | ICD-10-CM

## 2016-05-30 DIAGNOSIS — Z3480 Encounter for supervision of other normal pregnancy, unspecified trimester: Secondary | ICD-10-CM

## 2016-05-30 NOTE — Progress Notes (Signed)
Patient reports low pain and she has started having daily contractions that are not very painful.

## 2016-05-30 NOTE — Progress Notes (Signed)
   PRENATAL VISIT NOTE  Subjective:  Sarah Reese is a 31 y.o. G3P1011 at 102w2d being seen today for ongoing prenatal care.  She is currently monitored for the following issues for this low-risk pregnancy and has Supervision of normal pregnancy, antepartum; Cardiac murmur; Anemia during pregnancy; Labor and delivery indication for care or intervention; and GBS (group B Streptococcus carrier), +RV culture, currently pregnant on her problem list.  Patient reports no complaints.  Contractions: Irregular. Vag. Bleeding: None.  Movement: Present. Denies leaking of fluid.   The following portions of the patient's history were reviewed and updated as appropriate: allergies, current medications, past family history, past medical history, past social history, past surgical history and problem list. Problem list updated.  Objective:   Vitals:   05/30/16 0924  BP: 115/74  Pulse: (!) 101  Weight: 193 lb 9.6 oz (87.8 kg)    Fetal Status: Fetal Heart Rate (bpm): 146 Fundal Height: 36 cm Movement: Present     General:  Alert, oriented and cooperative. Patient is in no acute distress.  Skin: Skin is warm and dry. No rash noted.   Cardiovascular: Normal heart rate noted  Respiratory: Normal respiratory effort, no problems with respiration noted  Abdomen: Soft, gravid, appropriate for gestational age. Pain/Pressure: Present     Pelvic:  Cervical exam deferred        Extremities: Normal range of motion.  Edema: None  Mental Status: Normal mood and affect. Normal behavior. Normal judgment and thought content.   Assessment and Plan:  Pregnancy: G3P1011 at [redacted]w[redacted]d  1. Supervision of other normal pregnancy, antepartum     Doing well      2. GBS (group B Streptococcus carrier), +RV culture, currently pregnant     PCN for labor   3. Anemia during pregnancy     Taking OTC iron  Preterm labor symptoms and general obstetric precautions including but not limited to vaginal bleeding,  contractions, leaking of fluid and fetal movement were reviewed in detail with the patient. Please refer to After Visit Summary for other counseling recommendations.  Return in about 1 week (around 06/06/2016) for [place].   Morene Crocker, CNM

## 2016-06-02 ENCOUNTER — Observation Stay
Admission: EM | Admit: 2016-06-02 | Discharge: 2016-06-02 | Disposition: A | Discharge status: Home / Self Care | Payer: Medicaid Other | Attending: Obstetrics and Gynecology | Admitting: Obstetrics and Gynecology

## 2016-06-02 DIAGNOSIS — Z79899 Other long term (current) drug therapy: Secondary | ICD-10-CM | POA: Diagnosis not present

## 2016-06-02 DIAGNOSIS — O219 Vomiting of pregnancy, unspecified: Secondary | ICD-10-CM

## 2016-06-02 DIAGNOSIS — K529 Noninfective gastroenteritis and colitis, unspecified: Secondary | ICD-10-CM

## 2016-06-02 DIAGNOSIS — Z3A36 36 weeks gestation of pregnancy: Secondary | ICD-10-CM | POA: Insufficient documentation

## 2016-06-02 DIAGNOSIS — O26893 Other specified pregnancy related conditions, third trimester: Principal | ICD-10-CM | POA: Insufficient documentation

## 2016-06-02 DIAGNOSIS — D649 Anemia, unspecified: Secondary | ICD-10-CM | POA: Diagnosis not present

## 2016-06-02 DIAGNOSIS — O4703 False labor before 37 completed weeks of gestation, third trimester: Secondary | ICD-10-CM

## 2016-06-02 DIAGNOSIS — Z3480 Encounter for supervision of other normal pregnancy, unspecified trimester: Secondary | ICD-10-CM

## 2016-06-02 HISTORY — DX: Scoliosis, unspecified: M41.9

## 2016-06-02 LAB — URINE DRUG SCREEN, QUALITATIVE (ARMC ONLY)
AMPHETAMINES, UR SCREEN: NOT DETECTED
BARBITURATES, UR SCREEN: NOT DETECTED
BENZODIAZEPINE, UR SCRN: NOT DETECTED
CANNABINOID 50 NG, UR ~~LOC~~: NOT DETECTED
Cocaine Metabolite,Ur ~~LOC~~: NOT DETECTED
MDMA (Ecstasy)Ur Screen: NOT DETECTED
Methadone Scn, Ur: NOT DETECTED
OPIATE, UR SCREEN: NOT DETECTED
PHENCYCLIDINE (PCP) UR S: NOT DETECTED
Tricyclic, Ur Screen: NOT DETECTED

## 2016-06-02 LAB — URINALYSIS, COMPLETE (UACMP) WITH MICROSCOPIC
BILIRUBIN URINE: NEGATIVE
Glucose, UA: NEGATIVE mg/dL
HGB URINE DIPSTICK: NEGATIVE
KETONES UR: 80 mg/dL — AB
LEUKOCYTES UA: NEGATIVE
NITRITE: NEGATIVE
PROTEIN: 30 mg/dL — AB
Specific Gravity, Urine: 1.021 (ref 1.005–1.030)
pH: 6 (ref 5.0–8.0)

## 2016-06-02 MED ORDER — LACTATED RINGERS IV BOLUS (SEPSIS)
1000.0000 mL | Freq: Once | INTRAVENOUS | Status: AC
  Administered 2016-06-02: 1000 mL via INTRAVENOUS

## 2016-06-02 MED ORDER — TERBUTALINE SULFATE 1 MG/ML IJ SOLN
INTRAMUSCULAR | Status: AC
  Administered 2016-06-02: 0.25 mg via SUBCUTANEOUS
  Filled 2016-06-02: qty 1

## 2016-06-02 MED ORDER — ONDANSETRON 8 MG PO TBDP: 8.0000 mg | ORAL_TABLET | Freq: Three times a day (TID) | ORAL | 0 refills | Status: DC | PRN

## 2016-06-02 MED ORDER — ONDANSETRON HCL 4 MG/2ML IJ SOLN
8.0000 mg | Freq: Once | INTRAMUSCULAR | Status: AC
  Administered 2016-06-02: 8 mg via INTRAVENOUS
  Filled 2016-06-02: qty 4

## 2016-06-02 MED ORDER — TERBUTALINE SULFATE 1 MG/ML IJ SOLN
0.2500 mg | INTRAMUSCULAR | Status: AC
  Administered 2016-06-02: 0.25 mg via SUBCUTANEOUS

## 2016-06-02 MED ORDER — ACETAMINOPHEN 500 MG PO TABS
1000.0000 mg | ORAL_TABLET | Freq: Once | ORAL | Status: DC
  Filled 2016-06-02: qty 2

## 2016-06-02 MED ORDER — ACETAMINOPHEN 160 MG/5ML PO SOLN
960.0000 mg | Freq: Once | ORAL | Status: AC
  Administered 2016-06-02: 960 mg via ORAL
  Filled 2016-06-02: qty 40.6

## 2016-06-02 NOTE — OB Triage Note (Signed)
Pt G3P1011 [redacted]w[redacted]d complains of n/v/d since 0800 this AM. Pt states emesis x4, large amount, clear. States diarrhea x2. States she can not eat or drink anything without getting sick. Pt states ctx but does not know how often. Rates them 7/10. VSS. Monitors applied and assessing.

## 2016-06-02 NOTE — Discharge Instructions (Signed)
Come back if:  Big gush of fluids Temp over 100.4 Heavy vaginal bleeding Decreased fetal movement Contractions every 3-5 min lasting at least one hour  Stay well hydrated and get plenty of rest!

## 2016-06-02 NOTE — Final Progress Note (Signed)
Physician Final Progress Note  Patient ID: Karlyn Glasco MRN: [id number] DOB/AGE: Dec 21, 1985 31 y.o.  Admit date: 06/02/2016 Admitting provider: Rubie Maid, MD Discharge date: 06/02/2016   Admission Diagnoses:  1) intrauterine pregnancy at [redacted]w[redacted]d  2) nausea and vomiting 3) contractions  Discharge Diagnoses:  1) intrauterine pregnancy at [redacted]w[redacted]d  2) nausea and vomiting, likely due to a viral gastroenteritis 3) contractions, false labor  History of Present Illness: The patient is a 31 y.o. female G3P1011 at [redacted]w[redacted]d who presents for nausea with onset this morning. She has had several episodes of emesis today and states that she has been unable to keep food or liquid down.  She has also had several episodes of diarrhea.  She notes occasional contractions.  She notes +FM, no lof, and no vaginal bleeding.  She denies fevers, chills, headaches, abdominal pain (apart from contractions), urinary symptoms and vaginal symptoms. She has had no hematemesis nor hematochezia.    Hospital Course: she was admitted for the above.  Upon admission the fetal heart rate was 180 at baseline.  She was given 2 liters of LR in a bolus. She was given zofran IV. She was also given po tylenol. She was able to tolerate some liquids and tolerated a cracker.  She had what she rated strong contractions during her visit.  Her initial cervical exam was FT/long/hi.  Her contractions were note affected by IV fluids.  She was given a single dose of terbutaline, which did help initially.  Her contractions returned.  At this point she desired discharge as she was stable and wanted to be home.  I performed another cervical check and there was no change.  As she had no fever, the fetal heart rate baseline had dropped to 155 bpm, she had no nausea and vomiting and was tolerating some liquids, she was deemed reasonable for discharge.  She was given strict instructions to return to the hospital should her contractions get stronger or  she have any other change.  She was given zofran for nausea.   Past Medical History:  Diagnosis Date  . Anemia   . Asthma   . Heart murmur    since pregnancy  . Scoliosis     Past Surgical History:  Procedure Laterality Date  . DILATION AND CURETTAGE OF UTERUS    . WISDOM TOOTH EXTRACTION      No current facility-administered medications on file prior to encounter.    Current Outpatient Prescriptions on File Prior to Encounter  Medication Sig Dispense Refill  . docusate sodium (COLACE) 100 MG capsule Take 100 mg by mouth 2 (two) times daily.    . ferrous sulfate 325 (65 FE) MG EC tablet Take 325 mg by mouth 3 (three) times daily with meals.    Marland Kitchen omeprazole (PRILOSEC) 20 MG capsule Take 1 capsule (20 mg total) by mouth daily. 60 capsule 5  . Prenatal Vit-Fe Fumarate-FA (PRENATAL VITAMINS) 28-0.8 MG TABS Take by mouth.      No Known Allergies  Social History   Social History  . Marital status: Single    Spouse name: N/A  . Number of children: N/A  . Years of education: N/A   Occupational History  . Not on file.   Social History Main Topics  . Smoking status: Never Smoker  . Smokeless tobacco: Never Used  . Alcohol use No  . Drug use: No  . Sexual activity: Yes    Birth control/ protection: None     Comment: same-sex relationship  at this time   Other Topics Concern  . Not on file   Social History Narrative   ** Merged History Encounter **       Family History  Problem Relation Age of Onset  . Hypertension Mother   . Hypertension Maternal Grandmother     Physical Exam: BP 113/66   Pulse (!) 109   Temp 99 F (37.2 C) (Oral)   Resp 20   Ht 5\' 5"  (1.651 m)   Wt 192 lb (87.1 kg)   LMP 09/19/2015 (Exact Date)   SpO2 100%   BMI 31.95 kg/m   Physical Exam  Constitutional: She is oriented to person, place, and time and well-developed, well-nourished, and in no distress. No distress.  HENT:  Head: Normocephalic and atraumatic.  Eyes: Conjunctivae are  normal. No scleral icterus.  Neck: Normal range of motion. Neck supple. No tracheal deviation present. No thyromegaly present.  Cardiovascular: Normal rate and regular rhythm.  Exam reveals no gallop.   No murmur heard. Pulmonary/Chest: Effort normal and breath sounds normal. She has no wheezes. She has no rales.  Abdominal: Soft. There is no tenderness.  Gravid, no fundal tenderness  Genitourinary:  Genitourinary Comments: 1/thick/high, cervix posterior and left  Musculoskeletal: Normal range of motion. She exhibits no edema.  Lymphadenopathy:    She has no cervical adenopathy.  Neurological: She is alert and oriented to person, place, and time. No cranial nerve deficit.  Skin: Skin is warm and dry. No rash noted. She is not diaphoretic.  Psychiatric: Mood, affect and judgment normal.    Female chaperone present during pelvic exam.   Consults: None  Significant Findings/ Diagnostic Studies:  Lab Results  Component Value Date   APPEARANCEUR HAZY (A) 06/02/2016   GLUCOSEU NEGATIVE 06/02/2016   BILIRUBINUR NEGATIVE 06/02/2016   KETONESUR 80 (A) 06/02/2016   LABSPEC 1.021 06/02/2016   HGBUR NEGATIVE 06/02/2016   PHURINE 6.0 06/02/2016   NITRITE NEGATIVE 06/02/2016   LEUKOCYTESUR NEGATIVE 06/02/2016   RBCU 0-5 06/02/2016   WBCU 0-5 06/02/2016   BACTERIA FEW (A) 06/02/2016   EPIU 0-5 (A) 06/02/2016   MUCOUSUACOMP PRESENT 06/02/2016    Lab Results  Component Value Date   TCA NONE DETECTED 06/02/2016   AMPHETMU NONE DETECTED 06/02/2016   MDMA NONE DETECTED 06/02/2016   COCAINSCRNUR NONE DETECTED 06/02/2016   LABOPIA NONE DETECTED 06/02/2016   PCPSCRNUR NONE DETECTED 06/02/2016   THCU NONE DETECTED 06/02/2016   LABBARB NONE DETECTED 06/02/2016   LABBENZ NONE DETECTED 06/02/2016   METHADONE NONE DETECTED 06/02/2016    Urine culture pending  Procedures: NST Baseline FHR: 155 beats/min Variability: moderate Accelerations: present Decelerations: absent Tocometry: 1-2 q  10 min  RESULTS:  A NST procedure was performed with FHR monitoring and a normal baseline established, appropriate time of 20-40 minutes of evaluation, and accels >2 seen w 15x15 characteristics.  Results show a REACTIVE NST.    Discharge Condition: stable  Disposition: 01-Home or Self Care  Diet: as tolerated. Encourage liquids.   Discharge Activity: Activity as tolerated   Allergies as of 06/02/2016   No Known Allergies     Medication List    TAKE these medications   docusate sodium 100 MG capsule Commonly known as:  COLACE Take 100 mg by mouth 2 (two) times daily.   ferrous sulfate 325 (65 FE) MG EC tablet Take 325 mg by mouth 3 (three) times daily with meals.   omeprazole 20 MG capsule Commonly known as:  PRILOSEC Take 1  capsule (20 mg total) by mouth daily.   ondansetron 8 MG disintegrating tablet Commonly known as:  ZOFRAN ODT Take 1 tablet (8 mg total) by mouth every 8 (eight) hours as needed for nausea or vomiting.   Prenatal Vitamins 28-0.8 MG Tabs Take by mouth.      Follow-up [place]              Follow up.   Why:  Keep previously scheduled appointment Contact information: [facility]          Total time spent taking care of this patient: 30 minutes  Signed: Prentice Docker, MD  06/02/2016, 11:03 PM

## 2016-06-04 LAB — URINE CULTURE: SPECIAL REQUESTS: NORMAL

## 2016-06-07 ENCOUNTER — Encounter: Payer: Self-pay | Admitting: Certified Nurse Midwife

## 2016-06-07 ENCOUNTER — Ambulatory Visit (INDEPENDENT_AMBULATORY_CARE_PROVIDER_SITE_OTHER): Payer: Medicaid Other | Admitting: Certified Nurse Midwife

## 2016-06-07 VITALS — BP 122/72 | HR 96 | Wt 200.0 lb

## 2016-06-07 DIAGNOSIS — O99019 Anemia complicating pregnancy, unspecified trimester: Secondary | ICD-10-CM

## 2016-06-07 DIAGNOSIS — O99013 Anemia complicating pregnancy, third trimester: Secondary | ICD-10-CM

## 2016-06-07 DIAGNOSIS — G4701 Insomnia due to medical condition: Secondary | ICD-10-CM

## 2016-06-07 DIAGNOSIS — R011 Cardiac murmur, unspecified: Secondary | ICD-10-CM

## 2016-06-07 DIAGNOSIS — Z3480 Encounter for supervision of other normal pregnancy, unspecified trimester: Secondary | ICD-10-CM

## 2016-06-07 DIAGNOSIS — Z3483 Encounter for supervision of other normal pregnancy, third trimester: Secondary | ICD-10-CM

## 2016-06-07 DIAGNOSIS — O99820 Streptococcus B carrier state complicating pregnancy: Secondary | ICD-10-CM

## 2016-06-07 MED ORDER — HYDROXYZINE PAMOATE 50 MG PO CAPS: 50.0000 mg | ORAL_CAPSULE | Freq: Three times a day (TID) | ORAL | 0 refills | Status: DC | PRN

## 2016-06-07 MED ORDER — ZOLPIDEM TARTRATE 5 MG PO TABS: 5.0000 mg | ORAL_TABLET | Freq: Every evening | ORAL | 0 refills | Status: DC | PRN

## 2016-06-07 NOTE — Patient Instructions (Signed)
AREA PEDIATRIC/FAMILY PRACTICE PHYSICIANS  [facility] [address], [address] [place], [place]  [address] Phone - [phone]   Fax - [phone]  [facility] [address] [place], [place] [address] Phone - [phone]   Fax - [phone]  [facility] [address] [place], [place]  [address] Phone - [phone]   Fax - [phone]  [facility] [address] [place], [place]  [address] Phone - [phone]   Fax - [phone]  [facility] [address] [place], [place]  [address] Phone - [phone]   Fax - [phone]  [facility] [address] [place], [place]  [address] Phone - [phone]   Fax - [phone]  [facility] [address] [place], [place]  [address] Phone - [phone]   Fax - [phone]  [facility] [address] [place], [place]  [address] Phone - [phone]   Fax - [phone]  [facility] [address] [place], [place]  [address] Phone - [phone]   Fax - [phone] [facility] [address] [place], [place]  [address] Phone - [phone]   Fax - [phone]  [facility] [address] [place], [place]  [address] Phone - [phone]   Fax - [phone]  [facility] [address] [place], [place]  [address] Phone - [phone]   Fax - [phone]  [facility] [address] [place], [place]  [address] Phone - [phone]   Fax - [phone]  [facility] [address] [place], [place]  [address] Phone - [phone]  [facility] [address] [place], [place]  [address] Phone - [phone]   Fax - [phone]  [facility] [address] [place], [place]  [address] Phone - [phone]   Fax - [phone]  [facility] [address] [place], [place]  [address] Phone - [phone]   Fax - [phone]  [facility] [address] [place], [place]  [address] Phone - [phone]   Fax - [phone]   [facility] [address] [place], [place]  [address] Phone - [phone]   Fax - [phone]  [facility] [address] [place], [place]  [address] Phone - [phone]   Fax - [phone]  [facility] [address] [place], [place]  [address] Phone - [phone]   Fax - [phone]  [facility] [address] [place], [place]  [address] Phone - [phone]   Fax - [phone]  [facility] [address] , [place]  [address] Phone - [phone]   Fax - [phone] [facility] [address] [place], [place]  [address] Phone - [phone]   Fax - [phone]  [facility] [address] [place], [place]  [address] Phone - [phone]   Fax - [phone]  [facility] [address] [place], [place]  [address] Phone - [phone]   Fax - [phone]  [facility] Charlene Flemming MD [address] [place] [address] Phone [phone]  Fax [phone]   

## 2016-06-07 NOTE — Progress Notes (Signed)
   PRENATAL VISIT NOTE  Subjective:  Sarah Reese is a 31 y.o. G3P1011 at [redacted]w[redacted]d being seen today for ongoing prenatal care.  She is currently monitored for the following issues for this low-risk pregnancy and has Supervision of normal pregnancy, antepartum; Cardiac murmur; Anemia during pregnancy; and GBS (group B Streptococcus carrier), +RV culture, currently pregnant on her problem list.  Patient reports backache, no bleeding, no leaking, occasional contractions and lower leg edema, was seen in [facility] on the 12th and given 3 L of IVF for dehydration, is not having N/V/D currently.  Contractions: Irritability. Vag. Bleeding: None.  Movement: Present. Denies leaking of fluid.   The following portions of the patient's history were reviewed and updated as appropriate: allergies, current medications, past family history, past medical history, past social history, past surgical history and problem list. Problem list updated.  Objective:   Vitals:   06/07/16 1123  BP: 122/72  Pulse: 96  Weight: 200 lb (90.7 kg)    Fetal Status: Fetal Heart Rate (bpm): 153 Fundal Height: 37 cm Movement: Present     General:  Alert, oriented and cooperative. Patient is in no acute distress.  Skin: Skin is warm and dry. No rash noted.   Cardiovascular: Normal heart rate noted  Respiratory: Normal respiratory effort, no problems with respiration noted  Abdomen: Soft, gravid, appropriate for gestational age. Pain/Pressure: Present     Pelvic:  Cervical exam performed Dilation: 1 Effacement (%): Thick Station: -3  Extremities: Normal range of motion.  Edema: Moderate pitting, indentation subsides rapidly  Mental Status: Normal mood and affect. Normal behavior. Normal judgment and thought content.   Assessment and Plan:  Pregnancy: G3P1011 at [redacted]w[redacted]d  1. Supervision of other normal pregnancy, antepartum     Doing well  2. GBS (group B Streptococcus carrier), +RV culture, currently pregnant     PCN for  labor/delivery  3. Anemia during pregnancy     Taking OTC iron  4. Cardiac murmur        5. Insomnia due to medical condition     To try Vistaril first.   - zolpidem (AMBIEN) 5 MG tablet; Take 1 tablet (5 mg total) by mouth at bedtime as needed for sleep.  Dispense: 30 tablet; Refill: 0 - hydrOXYzine (VISTARIL) 50 MG capsule; Take 1 capsule (50 mg total) by mouth 3 (three) times daily as needed.  Dispense: 30 capsule; Refill: 0  Term labor symptoms and general obstetric precautions including but not limited to vaginal bleeding, contractions, leaking of fluid and fetal movement were reviewed in detail with the patient. Please refer to After Visit Summary for other counseling recommendations.  Return in about 1 week (around 06/14/2016) for [place].   Morene Crocker, CNM

## 2016-06-10 ENCOUNTER — Encounter: Payer: Self-pay | Admitting: Certified Nurse Midwife

## 2016-06-13 ENCOUNTER — Other Ambulatory Visit: Payer: Self-pay | Admitting: *Deleted

## 2016-06-13 DIAGNOSIS — B379 Candidiasis, unspecified: Secondary | ICD-10-CM

## 2016-06-13 MED ORDER — FLUCONAZOLE 150 MG PO TABS: 150.0000 mg | ORAL_TABLET | Freq: Once | ORAL | 0 refills | Status: AC

## 2016-06-13 MED ORDER — TERCONAZOLE 0.4 % VA CREA: 1.0000 | TOPICAL_CREAM | Freq: Every day | VAGINAL | 0 refills | Status: DC

## 2016-06-13 NOTE — Progress Notes (Unsigned)
Diflucan and Terazol sent for yeast infection per R.Denney.

## 2016-06-14 ENCOUNTER — Ambulatory Visit (INDEPENDENT_AMBULATORY_CARE_PROVIDER_SITE_OTHER): Payer: Medicaid Other | Admitting: Certified Nurse Midwife

## 2016-06-14 VITALS — BP 140/74 | HR 94 | Wt 203.0 lb

## 2016-06-14 DIAGNOSIS — O99019 Anemia complicating pregnancy, unspecified trimester: Secondary | ICD-10-CM

## 2016-06-14 DIAGNOSIS — R519 Headache, unspecified: Secondary | ICD-10-CM

## 2016-06-14 DIAGNOSIS — O99013 Anemia complicating pregnancy, third trimester: Secondary | ICD-10-CM

## 2016-06-14 DIAGNOSIS — O26893 Other specified pregnancy related conditions, third trimester: Secondary | ICD-10-CM

## 2016-06-14 DIAGNOSIS — O99820 Streptococcus B carrier state complicating pregnancy: Secondary | ICD-10-CM

## 2016-06-14 DIAGNOSIS — Z3483 Encounter for supervision of other normal pregnancy, third trimester: Secondary | ICD-10-CM

## 2016-06-14 DIAGNOSIS — Z3480 Encounter for supervision of other normal pregnancy, unspecified trimester: Secondary | ICD-10-CM

## 2016-06-14 DIAGNOSIS — R51 Headache: Secondary | ICD-10-CM

## 2016-06-14 MED ORDER — BUTALBITAL-APAP-CAFFEINE 50-325-40 MG PO TABS: 1.0000 | ORAL_TABLET | Freq: Four times a day (QID) | ORAL | 4 refills | Status: DC | PRN

## 2016-06-14 NOTE — Progress Notes (Signed)
Patient complains of headaches, dizziness and auroras.

## 2016-06-15 NOTE — Progress Notes (Signed)
   PRENATAL VISIT NOTE  Subjective:  Sarah Reese is a 31 y.o. G3P1011 at [redacted]w[redacted]d being seen today for ongoing prenatal care.  She is currently monitored for the following issues for this low-risk pregnancy and has Supervision of normal pregnancy, antepartum; Cardiac murmur; Anemia during pregnancy; and GBS (group B Streptococcus carrier), +RV culture, currently pregnant on her problem list.  Patient reports backache, no bleeding, no leaking and occasional contractions.  Contractions: Irritability. Vag. Bleeding: None.  Movement: Present. Denies leaking of fluid.   The following portions of the patient's history were reviewed and updated as appropriate: allergies, current medications, past family history, past medical history, past social history, past surgical history and problem list. Problem list updated.  Objective:   Vitals:   06/14/16 1103  BP: 140/74  Pulse: 94  Weight: 203 lb (92.1 kg)    Fetal Status: Fetal Heart Rate (bpm): 152 Fundal Height: 39 cm Movement: Present     General:  Alert, oriented and cooperative. Patient is in no acute distress.  Skin: Skin is warm and dry. No rash noted.   Cardiovascular: Normal heart rate noted  Respiratory: Normal respiratory effort, no problems with respiration noted  Abdomen: Soft, gravid, appropriate for gestational age. Pain/Pressure: Present     Pelvic:  Cervical exam performed Dilation: 1 Effacement (%): Thick Station: -3  Extremities: Normal range of motion.  Edema: Mild pitting, slight indentation  Mental Status: Normal mood and affect. Normal behavior. Normal judgment and thought content.   Assessment and Plan:  Pregnancy: G3P1011 at [redacted]w[redacted]d  1. Headache in pregnancy, antepartum, third trimester    Has tried OTC tylenol.  - butalbital-acetaminophen-caffeine (FIORICET, ESGIC) 50-325-40 MG tablet; Take 1-2 tablets by mouth every 6 (six) hours as needed.  Dispense: 45 tablet; Refill: 4  2. Supervision of other normal  pregnancy, antepartum     Normal discomforts of pregnancy.    3. Anemia during pregnancy     Taking OTC iron  4. GBS (group B Streptococcus carrier), +RV culture, currently pregnant     PCN for labor/delivery  Preterm labor symptoms and general obstetric precautions including but not limited to vaginal bleeding, contractions, leaking of fluid and fetal movement were reviewed in detail with the patient. Please refer to After Visit Summary for other counseling recommendations.  Return in about 1 week (around 06/21/2016) for [place].   Morene Crocker, CNM

## 2016-06-19 ENCOUNTER — Encounter: Payer: Self-pay | Admitting: *Deleted

## 2016-06-19 ENCOUNTER — Observation Stay
Admission: EM | Admit: 2016-06-19 | Discharge: 2016-06-19 | Disposition: A | Discharge status: Home / Self Care | Payer: Medicaid Other | Attending: Certified Nurse Midwife | Admitting: Certified Nurse Midwife

## 2016-06-19 ENCOUNTER — Encounter (HOSPITAL_COMMUNITY): Payer: Self-pay | Admitting: *Deleted

## 2016-06-19 ENCOUNTER — Inpatient Hospital Stay (HOSPITAL_COMMUNITY)
Admission: AD | Admit: 2016-06-19 | Discharge: 2016-06-21 | DRG: 775 | Disposition: A | Discharge status: Home / Self Care | Payer: Medicaid Other | Source: Ambulatory Visit | Attending: Obstetrics & Gynecology | Admitting: Obstetrics & Gynecology

## 2016-06-19 DIAGNOSIS — Z3A39 39 weeks gestation of pregnancy: Secondary | ICD-10-CM

## 2016-06-19 DIAGNOSIS — O99824 Streptococcus B carrier state complicating childbirth: Secondary | ICD-10-CM

## 2016-06-19 DIAGNOSIS — O9902 Anemia complicating childbirth: Secondary | ICD-10-CM | POA: Diagnosis present

## 2016-06-19 DIAGNOSIS — Z8249 Family history of ischemic heart disease and other diseases of the circulatory system: Secondary | ICD-10-CM

## 2016-06-19 DIAGNOSIS — D649 Anemia, unspecified: Secondary | ICD-10-CM | POA: Diagnosis present

## 2016-06-19 DIAGNOSIS — Z0371 Encounter for suspected problem with amniotic cavity and membrane ruled out: Principal | ICD-10-CM | POA: Insufficient documentation

## 2016-06-19 DIAGNOSIS — O4292 Full-term premature rupture of membranes, unspecified as to length of time between rupture and onset of labor: Secondary | ICD-10-CM | POA: Diagnosis present

## 2016-06-19 DIAGNOSIS — O4202 Full-term premature rupture of membranes, onset of labor within 24 hours of rupture: Secondary | ICD-10-CM | POA: Diagnosis present

## 2016-06-19 DIAGNOSIS — Z3480 Encounter for supervision of other normal pregnancy, unspecified trimester: Secondary | ICD-10-CM

## 2016-06-19 DIAGNOSIS — O4290 Premature rupture of membranes, unspecified as to length of time between rupture and onset of labor, unspecified weeks of gestation: Secondary | ICD-10-CM

## 2016-06-19 LAB — CBC
HCT: 29.9 % — ABNORMAL LOW (ref 36.0–46.0)
HEMOGLOBIN: 10 g/dL — AB (ref 12.0–15.0)
MCH: 29.9 pg (ref 26.0–34.0)
MCHC: 33.4 g/dL (ref 30.0–36.0)
MCV: 89.3 fL (ref 78.0–100.0)
Platelets: 142 10*3/uL — ABNORMAL LOW (ref 150–400)
RBC: 3.35 MIL/uL — AB (ref 3.87–5.11)
RDW: 13.8 % (ref 11.5–15.5)
WBC: 10 10*3/uL (ref 4.0–10.5)

## 2016-06-19 LAB — TYPE AND SCREEN
ABO/RH(D): O POS
Antibody Screen: NEGATIVE

## 2016-06-19 LAB — ABO/RH: ABO/RH(D): O POS

## 2016-06-19 MED ORDER — LIDOCAINE HCL (PF) 1 % IJ SOLN
INTRAMUSCULAR | Status: AC
  Filled 2016-06-19: qty 30

## 2016-06-19 MED ORDER — ONDANSETRON HCL 4 MG PO TABS: 4.0000 mg | ORAL_TABLET | ORAL | Status: DC | PRN

## 2016-06-19 MED ORDER — COCONUT OIL OIL: 1.0000 "application " | TOPICAL_OIL | Status: DC | PRN

## 2016-06-19 MED ORDER — SODIUM CHLORIDE 0.9% FLUSH: 3.0000 mL | Freq: Two times a day (BID) | INTRAVENOUS | Status: DC

## 2016-06-19 MED ORDER — ONDANSETRON HCL 4 MG/2ML IJ SOLN: 4.0000 mg | INTRAMUSCULAR | Status: DC | PRN

## 2016-06-19 MED ORDER — LACTATED RINGERS IV SOLN: 500.0000 mL | INTRAVENOUS | Status: DC | PRN

## 2016-06-19 MED ORDER — BENZOCAINE-MENTHOL 20-0.5 % EX AERO
1.0000 "application " | INHALATION_SPRAY | CUTANEOUS | Status: DC | PRN
  Administered 2016-06-19: 1 via TOPICAL
  Filled 2016-06-19: qty 56

## 2016-06-19 MED ORDER — PRENATAL MULTIVITAMIN CH
1.0000 | ORAL_TABLET | Freq: Every day | ORAL | Status: DC
  Filled 2016-06-19: qty 1

## 2016-06-19 MED ORDER — SOD CITRATE-CITRIC ACID 500-334 MG/5ML PO SOLN: 30.0000 mL | ORAL | Status: DC | PRN

## 2016-06-19 MED ORDER — PRENATAL MULTIVITAMIN CH: 1.0000 | ORAL_TABLET | Freq: Every day | ORAL | Status: DC

## 2016-06-19 MED ORDER — SODIUM CHLORIDE 0.9% FLUSH: 3.0000 mL | INTRAVENOUS | Status: DC | PRN

## 2016-06-19 MED ORDER — DOCUSATE SODIUM 100 MG PO CAPS
100.0000 mg | ORAL_CAPSULE | Freq: Two times a day (BID) | ORAL | Status: DC
  Administered 2016-06-20: 100 mg via ORAL
  Filled 2016-06-19 (×3): qty 1

## 2016-06-19 MED ORDER — ALBUTEROL SULFATE (2.5 MG/3ML) 0.083% IN NEBU: 3.0000 mL | INHALATION_SOLUTION | Freq: Four times a day (QID) | RESPIRATORY_TRACT | Status: DC | PRN

## 2016-06-19 MED ORDER — OXYTOCIN 10 UNIT/ML IJ SOLN
INTRAMUSCULAR | Status: AC
  Filled 2016-06-19: qty 1

## 2016-06-19 MED ORDER — OXYTOCIN BOLUS FROM INFUSION
500.0000 mL | Freq: Once | INTRAVENOUS | Status: AC | PRN
  Administered 2016-06-19: 500 mL via INTRAVENOUS

## 2016-06-19 MED ORDER — IBUPROFEN 600 MG PO TABS
600.0000 mg | ORAL_TABLET | Freq: Four times a day (QID) | ORAL | Status: DC
  Administered 2016-06-19: 600 mg via ORAL
  Filled 2016-06-19: qty 1

## 2016-06-19 MED ORDER — TETANUS-DIPHTH-ACELL PERTUSSIS 5-2.5-18.5 LF-MCG/0.5 IM SUSP: 0.5000 mL | Freq: Once | INTRAMUSCULAR | Status: DC

## 2016-06-19 MED ORDER — ACETAMINOPHEN 325 MG PO TABS: 650.0000 mg | ORAL_TABLET | ORAL | Status: DC | PRN

## 2016-06-19 MED ORDER — ZOLPIDEM TARTRATE 5 MG PO TABS: 5.0000 mg | ORAL_TABLET | Freq: Every evening | ORAL | Status: DC | PRN

## 2016-06-19 MED ORDER — PANTOPRAZOLE SODIUM 40 MG PO TBEC
40.0000 mg | DELAYED_RELEASE_TABLET | Freq: Every day | ORAL | Status: DC
  Filled 2016-06-19: qty 1

## 2016-06-19 MED ORDER — LACTATED RINGERS IV SOLN
INTRAVENOUS | Status: DC
  Administered 2016-06-19: 12:00:00 via INTRAVENOUS

## 2016-06-19 MED ORDER — FERROUS SULFATE 325 (65 FE) MG PO TABS
325.0000 mg | ORAL_TABLET | Freq: Three times a day (TID) | ORAL | Status: DC
  Administered 2016-06-19: 325 mg via ORAL
  Filled 2016-06-19 (×4): qty 1

## 2016-06-19 MED ORDER — WITCH HAZEL-GLYCERIN EX PADS: 1.0000 "application " | MEDICATED_PAD | CUTANEOUS | Status: DC | PRN

## 2016-06-19 MED ORDER — DIBUCAINE 1 % RE OINT: 1.0000 "application " | TOPICAL_OINTMENT | RECTAL | Status: DC | PRN

## 2016-06-19 MED ORDER — LIDOCAINE HCL (PF) 1 % IJ SOLN
30.0000 mL | INTRAMUSCULAR | Status: DC | PRN
  Administered 2016-06-19: 30 mL via SUBCUTANEOUS
  Filled 2016-06-19: qty 30

## 2016-06-19 MED ORDER — IBUPROFEN 100 MG/5ML PO SUSP
600.0000 mg | Freq: Four times a day (QID) | ORAL | Status: DC
  Administered 2016-06-19 – 2016-06-21 (×4): 600 mg via ORAL
  Filled 2016-06-19 (×11): qty 30

## 2016-06-19 MED ORDER — MEASLES, MUMPS & RUBELLA VAC ~~LOC~~ INJ
0.5000 mL | INJECTION | Freq: Once | SUBCUTANEOUS | Status: DC
  Filled 2016-06-19: qty 0.5

## 2016-06-19 MED ORDER — ACETAMINOPHEN 325 MG PO TABS
650.0000 mg | ORAL_TABLET | ORAL | Status: DC | PRN
  Administered 2016-06-19: 650 mg via ORAL
  Filled 2016-06-19: qty 2

## 2016-06-19 MED ORDER — SODIUM CHLORIDE 0.9 % IV SOLN
2.0000 g | Freq: Once | INTRAVENOUS | Status: AC
  Administered 2016-06-19: 2 g via INTRAVENOUS
  Filled 2016-06-19: qty 2000

## 2016-06-19 MED ORDER — SODIUM CHLORIDE 0.9 % IV SOLN: 250.0000 mL | INTRAVENOUS | Status: DC | PRN

## 2016-06-19 MED ORDER — FENTANYL CITRATE (PF) 100 MCG/2ML IJ SOLN
100.0000 ug | INTRAMUSCULAR | Status: DC | PRN
  Administered 2016-06-19: 100 ug via INTRAVENOUS
  Filled 2016-06-19: qty 2

## 2016-06-19 MED ORDER — SIMETHICONE 80 MG PO CHEW: 80.0000 mg | CHEWABLE_TABLET | ORAL | Status: DC | PRN

## 2016-06-19 MED ORDER — ONDANSETRON HCL 4 MG/2ML IJ SOLN: 4.0000 mg | Freq: Four times a day (QID) | INTRAMUSCULAR | Status: DC | PRN

## 2016-06-19 MED ORDER — OXYTOCIN 40 UNITS IN LACTATED RINGERS INFUSION - SIMPLE MED
2.5000 [IU]/h | Freq: Once | INTRAVENOUS | Status: DC | PRN
  Filled 2016-06-19: qty 1000

## 2016-06-19 NOTE — Progress Notes (Addendum)
G2P1 @ 39+[redacted] wksga. Presents to triage for grossly SROM - clear.  EFM applied and noted +FM. Pt ctx q1-2 minutes strong wheln palpating abdomen.   1145: wheelchaired to bathroom. Pt voided wo issues. Back in chair to room  1150: SVE: 8.5/90/0 with copious amount of fluid noted. No blood noted.  1155: Provider notified. Orders received to admit. anotherr RN called birthing charge nurse and room assigned to 166.  1200: Pt to birthing room 166 via gurney.   Triage assessment on CC not done due to pt in active labor and transferred to birthing immediately after vaginal exam and obtaining admit orders from [place] and room assignment from birthing charge nurse.

## 2016-06-19 NOTE — Lactation Note (Signed)
This note was copied from a baby's chart. Lactation Consultation Note  Patient Name: Sarah Reese BTCYE'L Date: 06/19/2016   Per Noemi Chapel, RN, Mom wants to formula-feed at this time. She does not have an interest in putting the infant to the breast. Mom might choose to pump & BO. Per RN, Mom will notify her nurse if/when she is ready to use a pump.   Matthias Hughs [facility] 06/19/2016, 7:57 PM

## 2016-06-19 NOTE — H&P (Signed)
DUKGURK Sarah Reese is a 31 y.o. female G3P1011 @ 39.1 wks presenting for SROM @ 1100 andcontractions since 0400.Marland Kitchen OB History    Gravida Para Term Preterm AB Living   3 1 1  0 1 1   SAB TAB Ectopic Multiple Live Births   1 0 0 0 1     Past Medical History:  Diagnosis Date  . Anemia   . Asthma   . Heart murmur    since pregnancy  . Scoliosis    Past Surgical History:  Procedure Laterality Date  . DILATION AND CURETTAGE OF UTERUS    . WISDOM TOOTH EXTRACTION     Family History: family history includes Hypertension in her maternal grandmother and mother. Social History:  reports that she has never smoked. She has never used smokeless tobacco. She reports that she does not drink alcohol or use drugs.     Maternal Diabetes: No Genetic Screening: Normal Maternal Ultrasounds/Referrals: Normal Fetal Ultrasounds or other Referrals:  None Maternal Substance Abuse:  No Significant Maternal Medications:  None Significant Maternal Lab Results:  Lab values include: Group B Strep positive Other Comments:  None  Review of Systems  Constitutional: Negative.   HENT: Negative.   Eyes: Negative.   Respiratory: Negative.   Cardiovascular: Negative.   Gastrointestinal: Positive for abdominal pain.  Genitourinary: Negative.   Musculoskeletal: Negative.   Skin: Negative.   Neurological: Negative.   Endo/Heme/Allergies: Negative.   Psychiatric/Behavioral: Negative.    Maternal Medical History:  Reason for admission: Rupture of membranes and contractions.   Contractions: Onset was 1-2 hours ago.   Frequency: regular.   Perceived severity is moderate.    Fetal activity: Perceived fetal activity is normal.   Last perceived fetal movement was within the past hour.    Prenatal complications: no prenatal complications Prenatal Complications - Diabetes: none.    Dilation: 8 Effacement (%): 100 Station: 0 Exam by:: Len Blalock SCNM  Last menstrual period 09/19/2015. Maternal  Exam:  Uterine Assessment: Contraction strength is moderate.  Abdomen: Patient reports no abdominal tenderness. Estimated fetal weight is 8lbs.   Fetal presentation: vertex  Introitus: Normal vulva. Normal vagina.  Ferning test: not done.  Nitrazine test: not done. Amniotic fluid character: clear.  Pelvis: adequate for delivery.   Cervix: Cervix evaluated by digital exam.     Fetal Exam Fetal Monitor Review: Mode: ultrasound.   Variability: moderate (6-25 bpm).   Pattern: accelerations present and no decelerations.    Fetal State Assessment: Category I - tracings are normal.     Physical Exam  Constitutional: She is oriented to person, place, and time. She appears well-developed and well-nourished.  HENT:  Head: Normocephalic.  Eyes: Pupils are equal, round, and reactive to light.  Neck: Normal range of motion.  Cardiovascular: Normal rate, regular rhythm, normal heart sounds and intact distal pulses.   Respiratory: Effort normal and breath sounds normal.  GI: Soft. Bowel sounds are normal.  Genitourinary: Vagina normal and uterus normal.  Musculoskeletal: Normal range of motion.  Neurological: She is alert and oriented to person, place, and time. She has normal reflexes.  Skin: Skin is warm and dry.  Psychiatric: She has a normal mood and affect. Her behavior is normal. Judgment and thought content normal.    Prenatal labs: ABO, Rh: O/Positive/-- (10/11 1041) Antibody: Negative (10/11 1041) Rubella: 3.48 (10/11 1041) RPR: Non Reactive (02/12 1135)  HBsAg: Negative (10/11 1041)  HIV: Non Reactive (02/12 1135)  GBS: Positive (04/02 1408)   Assessment/Plan:  Admit Active labor Pos GBS GBS prophalaxis   Koren Shiver 06/19/2016, 12:11 PM

## 2016-06-19 NOTE — Discharge Instructions (Signed)
Come back if: ° °Big gush of fluids °Decreased fetal movement °Heavy vaginal bleeding °Temp over 100.4 °Contractions every 3-5 min lasting at least one hour ° °Get plenty of rest and stay well hydrated! °

## 2016-06-19 NOTE — OB Triage Note (Signed)
Recvd pt from ED. Pt c/o leaking fluid all day and then woke up in a puddle of fluid. Pt states she is having contractions about 10 min apart and has some spotting. Feeling baby move ok. No intercourse in the past 24 hours.

## 2016-06-19 NOTE — Final Progress Note (Signed)
Physician Final Progress Note  Patient ID: Sarah Reese MRN: [id number] DOB/AGE: 06-29-1985 31 y.o.  Admit date: 06/19/2016 Admitting provider: Alanda Slim Defrancesco, MD/ Jesus Genera. Danise Mina, CNM Discharge date: 06/19/2016   Admission Diagnoses: IUP at 39.1 weeks Leakage of fluid  Discharge Diagnoses:  IUP at 39.1 wk with no evidence of premature rupture of membranes Consults: None  Significant Findings/ Diagnostic Studies: 31 year old Black female with EDC=06/25/2016 presents at 39 weeks 1 day with c/o leakage of fluid. Woke up in a puddle around 1:30 this AM and has had a little leakage since then. Has been having increased discharge recently and was treated for a monilial infection with Diflucan 4/23 with relief of itching symptoms. Has also noticed a little blood in the discharge. [facility] at [facility]. Just recently moved to a home in [place]. Past history positive for asthma  Exam: General : gravid BF in NAD BP:122/84 FHR 135 baseline with accelerations to 150s to 160s, moderate variability Toco: irregular contractions Abdomen: Cephalic presentation confirmed with ultrasound SSE: Vulva: no lesions or inflammation Vagina: small amt blood tinted mucoid discharge Nitrazine neg/ fern neg/ pooling neg Wet prep negative for hyphae, Trich, clue cells Cervix: FT/ TH/ -2  A: no evidence of labor or PROM  P: Discharge home with labor precautions FU at [facility] for prenatal appt if NIL  Procedures: none  Discharge Condition: stable  Disposition: 01-Home or Self Care  Diet: Regular diet  Discharge Activity: Activity as tolerated  Discharge Instructions    Discharge patient    Complete by:  As directed    Discharge disposition:  01-Home or Self Care   Discharge patient date:  06/19/2016     Allergies as of 06/19/2016   No Known Allergies     Medication List    STOP taking these medications   terconazole 0.4 % vaginal cream Commonly known as:   TERAZOL 7     TAKE these medications   butalbital-acetaminophen-caffeine 50-325-40 MG tablet Commonly known as:  FIORICET, ESGIC Take 1-2 tablets by mouth every 6 (six) hours as needed.   docusate sodium 100 MG capsule Commonly known as:  COLACE Take 100 mg by mouth 2 (two) times daily.   ferrous sulfate 325 (65 FE) MG EC tablet Take 325 mg by mouth 3 (three) times daily with meals.   hydrOXYzine 50 MG capsule Commonly known as:  VISTARIL Take 1 capsule (50 mg total) by mouth 3 (three) times daily as needed.   omeprazole 20 MG capsule Commonly known as:  PRILOSEC Take 1 capsule (20 mg total) by mouth daily.   ondansetron 8 MG disintegrating tablet Commonly known as:  ZOFRAN ODT Take 1 tablet (8 mg total) by mouth every 8 (eight) hours as needed for nausea or vomiting.   Prenatal Vitamins 28-0.8 MG Tabs Take by mouth.   zolpidem 5 MG tablet Commonly known as:  AMBIEN Take 1 tablet (5 mg total) by mouth at bedtime as needed for sleep.        Total time spent taking care of this patient: 15 minutes  Signed: Dalia Heading 06/19/2016, 3:19 AM

## 2016-06-19 NOTE — Progress Notes (Signed)
Patient c/o shortness of breath the last two times she got up to use bathroom.  No concerns when she is in bed.  Able to ambulate to bathroom and back on her own.  VS WNL, breathing unlabored, clear; sat02 is 99%, no swelling in feet.  Notified CNM Jerilynn Mages. Drake Leach); considered ordering CBC for morning. [email or web address], rn

## 2016-06-20 LAB — RPR: RPR Ser Ql: NONREACTIVE

## 2016-06-20 MED ORDER — ACETAMINOPHEN 160 MG/5ML PO SOLN
650.0000 mg | ORAL | Status: DC | PRN
  Administered 2016-06-21: 650 mg via ORAL
  Filled 2016-06-20: qty 20.3

## 2016-06-20 MED ORDER — FERROUS SULFATE 325 (65 FE) MG PO TABS
325.0000 mg | ORAL_TABLET | Freq: Two times a day (BID) | ORAL | Status: DC
  Filled 2016-06-20: qty 1

## 2016-06-20 MED ORDER — IBUPROFEN 100 MG/5ML PO SUSP: 600.0000 mg | Freq: Four times a day (QID) | ORAL | 2 refills | Status: DC

## 2016-06-20 NOTE — Progress Notes (Signed)
Post Partum Day #1 Subjective: no complaints, up ad lib, voiding and tolerating PO  Objective: Blood pressure 107/66, pulse 80, temperature 97.8 F (36.6 C), resp. rate 18, height 5\' 5"  (1.651 m), weight 205 lb (93 kg), last menstrual period 09/19/2015, SpO2 99 %, unknown if currently breastfeeding.  Physical Exam:  General: alert, cooperative and no distress Lochia: appropriate Uterine Fundus: firm Incision: 2nd deg lac, healing DVT Evaluation: No evidence of DVT seen on physical exam. No cords or calf tenderness. No significant calf/ankle edema.   Recent Labs  06/19/16 1719  HGB 10.0*  HCT 29.9*    Assessment/Plan: Discharge home and Contraception OCPs/none.  Anemia: asymptomatic.    LOS: 1 day   Morene Crocker, CNM 06/20/2016, 7:57 AM

## 2016-06-20 NOTE — Lactation Note (Signed)
This note was copied from a baby's chart. Lactation Consultation Note  Patient Name: Boy Tashara Suder [id number] Date: 06/20/2016 Reason for consult: Initial assessment Mom is planning to formula/bottle feed. Mom considered pump/bottle feeding but she has piercing both nipples. LC advised she would need to remove nipple piercing but Mom does not want to do this so will stay with formula/bottle feeding.   Maternal Data    Feeding    LATCH Score/Interventions                      Lactation Tools Discussed/Used     Consult Status Consult Status: Complete    Katrine Coho 06/20/2016, 11:45 AM

## 2016-06-20 NOTE — Progress Notes (Signed)
Post discharge chart review completed.  

## 2016-06-20 NOTE — Discharge Summary (Addendum)
OB Discharge Summary     Patient Name: Sarah Reese DOB: [birth year] MRN: [id number]  Date of admission: 06/19/2016 Delivering MD: Koren Shiver D   Date of discharge: 06/21/2016  Admitting diagnosis: 39 WKS, CTXS Intrauterine pregnancy: [redacted]w[redacted]d     Secondary diagnosis:  Active Problems:   Labor and delivery indication for care or intervention  Additional problems: none     Discharge diagnosis: Term Pregnancy Delivered                                                                                                Post partum procedures:n/a  Augmentation: none  Complications: None  Hospital course:  Onset of Labor With Vaginal Delivery     31 y.o. yo [id number] at [redacted]w[redacted]d was admitted in Active Labor on 06/19/2016. Patient had an uncomplicated labor course as follows:  Membrane Rupture Time/Date: 11:00 AM ,06/19/2016   Intrapartum Procedures: Episiotomy: None [1]                                         Lacerations:  2nd degree [3];Perineal [11]  Patient had a delivery of a Viable infant. 06/19/2016  Information for the patient's newborn:  Dailey, Alberson [[id number]]  Delivery Method: Vaginal, Spontaneous Delivery (Filed from Delivery Summary)    Pateint had an uncomplicated postpartum course.  She is ambulating, tolerating a regular diet, passing flatus, and urinating well. Patient is discharged home in stable condition on 06/21/16.   Physical exam  Vitals:   06/19/16 2030 06/20/16 0630 06/20/16 1804 06/21/16 0519  BP: 116/69 107/66 107/73 118/61  Pulse: 75 80 85 85  Resp: 18 18 18 18   Temp: 98.5 F (36.9 C) 97.8 F (36.6 C) 98 F (36.7 C) 97.7 F (36.5 C)  TempSrc: Oral  Oral Oral  SpO2: 99%     Weight:      Height:       General: alert, cooperative and no distress Lochia: appropriate Uterine Fundus: firm Incision: 2nd deg, healing DVT Evaluation: No evidence of DVT seen on physical exam. No cords or calf tenderness. No significant calf/ankle  edema. Labs: Lab Results  Component Value Date   WBC 10.0 06/19/2016   HGB 10.0 (L) 06/19/2016   HCT 29.9 (L) 06/19/2016   MCV 89.3 06/19/2016   PLT 142 (L) 06/19/2016   CMP Latest Ref Rng & Units 02/03/2016  Glucose 65 - 99 mg/dL 92  BUN 6 - 20 mg/dL 6  Creatinine 0.44 - 1.00 mg/dL 0.52  Sodium 135 - 145 mmol/L 134(L)  Potassium 3.5 - 5.1 mmol/L 3.2(L)  Chloride 101 - 111 mmol/L 105  CO2 22 - 32 mmol/L 22  Calcium 8.9 - 10.3 mg/dL 9.1  Total Protein 6.5 - 8.1 g/dL 7.0  Total Bilirubin 0.3 - 1.2 mg/dL 0.4  Alkaline Phos 38 - 126 U/L 40  AST 15 - 41 U/L 29  ALT 14 - 54 U/L 14    Discharge instruction: per After Visit Summary and "Baby and Me  Booklet".  After visit meds:  Allergies as of 06/21/2016   No Known Allergies     Medication List    STOP taking these medications   hydrOXYzine 50 MG capsule Commonly known as:  VISTARIL   omeprazole 20 MG capsule Commonly known as:  PRILOSEC   ondansetron 8 MG disintegrating tablet Commonly known as:  ZOFRAN ODT   prenatal multivitamin Tabs tablet     TAKE these medications   albuterol 108 (90 Base) MCG/ACT inhaler Commonly known as:  PROVENTIL HFA;VENTOLIN HFA Inhale 2 puffs into the lungs every 6 (six) hours as needed for wheezing or shortness of breath.   butalbital-acetaminophen-caffeine 50-325-40 MG tablet Commonly known as:  FIORICET, ESGIC Take 1-2 tablets by mouth every 6 (six) hours as needed.   docusate sodium 100 MG capsule Commonly known as:  COLACE Take 100 mg by mouth 2 (two) times daily.   ferrous sulfate 325 (65 FE) MG EC tablet Take 325 mg by mouth 3 (three) times daily with meals.   ibuprofen 100 MG/5ML suspension Commonly known as:  ADVIL,MOTRIN Take 30 mLs (600 mg total) by mouth every 6 (six) hours.   zolpidem 5 MG tablet Commonly known as:  AMBIEN Take 1 tablet (5 mg total) by mouth at bedtime as needed for sleep. What changed:  Another medication with the same name was added. Make sure  you understand how and when to take each.   zolpidem 5 MG tablet Commonly known as:  AMBIEN Take 1 tablet (5 mg total) by mouth at bedtime as needed for sleep. What changed:  You were already taking a medication with the same name, and this prescription was added. Make sure you understand how and when to take each.       Diet: routine diet  Activity: Advance as tolerated. Pelvic rest for 6 weeks.   Outpatient follow up:6 weeks Follow up Appt: Future Appointments Date Time Provider [place]  08/12/2016 1:30 PM Shaylar Charmian Muff, MD [facility] None   Follow up Visit:No Follow-up on file.  Postpartum contraception: Combination OCPs and Abstinence  Newborn Data: Live born female  Birth Weight: 8 lb 9.8 oz (3906 g) APGAR: 9, 9  Baby Feeding: Bottle Disposition:home with mother   06/21/2016 Morene Crocker, CNM

## 2016-06-21 MED ORDER — ZOLPIDEM TARTRATE 5 MG PO TABS: 5.0000 mg | ORAL_TABLET | Freq: Every evening | ORAL | 0 refills | Status: DC | PRN

## 2016-06-21 NOTE — Progress Notes (Signed)
Post Partum Day #2 Subjective: no complaints, up ad lib, voiding, tolerating PO and reports insomnia.  Objective: Blood pressure 118/61, pulse 85, temperature 97.7 F (36.5 C), temperature source Oral, resp. rate 18, height 5\' 5"  (1.651 m), weight 205 lb (93 kg), last menstrual period 09/19/2015, SpO2 99 %, unknown if currently breastfeeding.  Physical Exam:  General: alert, cooperative and no distress Lochia: appropriate Uterine Fundus: firm Incision: n/a DVT Evaluation: No evidence of DVT seen on physical exam. No cords or calf tenderness. No significant calf/ankle edema.   Recent Labs  06/19/16 1719  HGB 10.0*  HCT 29.9*    Assessment/Plan: Discharge home and Contraception same sex relationship/OCPs for AUB/Dysmenorrhea requested   LOS: 2 days   Sarah Reese, CNM 06/21/2016, 7:37 AM

## 2016-06-21 NOTE — Progress Notes (Signed)
In room with the pt is the patient's partner and partner's 22-month old child (same biological father as pt's baby). Pt stated she intended for the newborn to sleep in the bassinet but the 4-month old would sleep in bed with her. Contacted house coverage to confirm this was all right, house coverage stated that it was because the 51-month-old is not under [facility] care, but to inform pt that we assume no responsibility for the risks of sleeping in bed with baby. Pt verbally agreed.

## 2016-06-22 NOTE — Clinical Social Work Maternal (Signed)
CLINICAL SOCIAL WORK MATERNAL/CHILD NOTE  Patient Details  Name: Melanye Nadita Newlun MRN: [id number] Date of Birth: 10/28/1985  Date:  06/22/2016  Clinical Social Worker Initiating Note:  Anyla Israelson Boyd-Gilyard Date/ Time Initiated:  06/21/16/1434     Child's Name:  Jah'Mir Schlesinger   Legal Guardian:  Mother (Same Sex Couple: Partner is Ebony Brown 09/05/1985)   Need for Interpreter:  None   Date of Referral:  06/20/16     Reason for Referral:  Other (Comment) (relationship concerns with partner. )   Referral Source:  Central Nurs[facility]   Address:  [address] [place] [address]  Phone number:  [phone]   Household Members:  Self, Domestic Partner, Minor Children (minor children are Destin Heist (M) 09/22/09 and Jaden Brown (M) 03/24/2016)   Natural Supports (not living in the home):      Professional Supports: Case Manager/Social Worker (MOB's oldest son school social worker has been a support. )   Employment: Unemployed   Type of Work:     Education:  High school graduate   Financial Resources:  Medicaid   Other Resources:  WIC, Food Stamps    Cultural/Religious Considerations Which May Impact Care:  Per MOB's chart, MOB is Christian  Strengths:  Ability to meet basic needs , Home prepared for child    Risk Factors/Current Problems:  Family/Relationship Issues    Cognitive State:  Able to Concentrate , Alert , Linear Thinking    Mood/Affect:  Sad , Angry , Interested , Agitated    CSW Assessment:  CSW met with MOB to complete an assessment for relationship concerns.  When CSW arrived, MOB was in resting bed engaging in skin to skin with infant (infant on photo therapy).  MOB's partner was sitting in the chair holding the couples 3 month old son.  MOB gave CSW permission to meet with MOB while MOB's partner was present. CSW inquired about relationship concerns that could impact the couples parenting.  MOB reported that MOB's partner has MH concerns and  MOB is concerned that MOB's partner is abusing her prescribed medications.  CSW validated and normalized MOB's concerns.  MOB's partner acknowledged the misuse of medications.  With the assistance of CSW, the couple was able to process solutions to the couples concerns.  The couple was able to engage in effective communication and actively listened to one another as they shared their thoughts, feelings, and concerns. CSW praised them for engaging in effective communication and encouraged them to continue.  CSW  Offered the couple resources for couples counseling however, they declined.  The couple agreed that at this time it is best for them to continue to live together and to focus on co-parenting as oppose to being in a relationship with one another.  MOB was emotional and appeared sad as evident by her facial expression and voice tone.  CSW encouraged the parents to create a safe and healthy environment for the children. CSW offered MOB individual counseling resources; MOB declined. MOB expressed frustration about staying in the hospital longer than MOB had planned.  MOB communicated that MOB has an older son and MOB needs to arrange for transportation for school and childcare for her oldest son since MOB's hospital stay has been extended.  MOB requested to leave infant in CN for 2 hours while MOB engaged in preparation for MOB's oldest child.  CSW will consult with CN to see if that can be option for the family.   CSW educated MOB about PPD. CSW   informed MOB of possible supports and interventions to decrease PPD.  CSW also encouraged MOB to seek medical attention if needed for increased signs and symptoms for PPD.  MOB denied having any PPD symptoms with MOB's oldest child.  CSW also educated MOB about SIDS and safe sleep.  MOB was knowledgeable.  MOB asked questions and responded appropriately to CSW's questions.  MOB reported having a crib and bassinet for infant.  CSW encouraged the parent's not to  co-sleep with infants' and to not allow infants' to co-sleep with one another; the couple agreed.   CSW spoke with CN staff and it was determined that MOB could leave hospital for up to 2 hours to arrange for childcare and school readiness for MOB's 6 year old son. MOB expressed appreciation for the option and will inform MOB's bedside nurse if MOB will need to leave.   There are no barriers to d/c.    CSW Plan/Description:  Information/Referral to Community Resources , Patient/Family Education , No Further Intervention Required/No Barriers to Discharge   Jayni Prescher Boyd-Gilyard, MSW, LCSW Clinical Social Work [phone]    Aahan Marques D BOYD-GILYARD, LCSW 06/22/2016, 9:10 AM 

## 2016-06-23 ENCOUNTER — Encounter: Payer: Medicaid Other | Admitting: Certified Nurse Midwife

## 2016-07-27 ENCOUNTER — Encounter: Payer: Self-pay | Admitting: Certified Nurse Midwife

## 2016-07-27 ENCOUNTER — Ambulatory Visit (INDEPENDENT_AMBULATORY_CARE_PROVIDER_SITE_OTHER): Payer: Medicaid Other | Admitting: Certified Nurse Midwife

## 2016-07-27 DIAGNOSIS — Z8742 Personal history of other diseases of the female genital tract: Secondary | ICD-10-CM

## 2016-07-27 MED ORDER — LEVONORGEST-ETH ESTRAD 91-DAY 0.15-0.03 &0.01 MG PO TABS: 1.0000 | ORAL_TABLET | Freq: Every day | ORAL | 4 refills | Status: DC

## 2016-07-27 NOTE — Progress Notes (Signed)
6 week Post Partum Exam  Sarah Reese is a 31 y.o. [phone] female who presents for a postpartum visit. She is 5 weeks postpartum following a spontaneous vaginal delivery. I have fully reviewed the prenatal and intrapartum course. The delivery was at 39.1 gestational weeks.  Anesthesia: none. Postpartum course has been normal. Baby's course has been normal. Baby is feeding by bottle - Similac Advance. Bleeding no bleeding. Bowel function is normal. Bladder function is normal. Patient is sexually active. Contraception method is oral progesterone-only contraceptive. Postpartum depression screening:neg  The following portions of the patient's history were reviewed and updated as appropriate: allergies, current medications, past family history, past medical history, past social history, past surgical history and problem list.  Review of Systems Pertinent items noted in HPI and remainder of comprehensive ROS otherwise negative.    Objective:  unknown if currently breastfeeding.  General:  alert, cooperative and no distress   Breasts:  inspection negative, no nipple discharge or bleeding, no masses or nodularity palpable  Lungs: clear to auscultation bilaterally  Heart:  regular rate and rhythm, S1, S2 normal, no murmur, click, rub or gallop  Abdomen: soft, non-tender; bowel sounds normal; no masses,  no organomegaly  Pelvic  Exam: Not performed.        Assessment:    Normal 6 week postpartum exam. Pap smear not done at today's visit.   Plan:   1. Contraception: abstinence and homosexual relationship 2. OCPs for hx of dysmenorrhea and AUB 3. Follow up in: 4 months for annual exam or as needed.

## 2016-08-12 ENCOUNTER — Ambulatory Visit: Payer: Medicaid Other | Admitting: Allergy

## 2017-02-06 ENCOUNTER — Other Ambulatory Visit (HOSPITAL_COMMUNITY): Payer: Self-pay | Admitting: Research Study

## 2017-02-06 DIAGNOSIS — Z006 Encounter for examination for normal comparison and control in clinical research program: Secondary | ICD-10-CM

## 2017-02-07 ENCOUNTER — Ambulatory Visit (HOSPITAL_COMMUNITY)
Admission: RE | Admit: 2017-02-07 | Discharge: 2017-02-07 | Disposition: A | Discharge status: Home / Self Care | Payer: Self-pay | Source: Ambulatory Visit | Attending: Research Study | Admitting: Research Study

## 2017-02-07 DIAGNOSIS — Z006 Encounter for examination for normal comparison and control in clinical research program: Secondary | ICD-10-CM

## 2017-08-06 IMAGING — CR DG CHEST 2V
2 series · 2 of 2 positions shown · non-contrast
Comparison: Thoracic spine radiographs performed 10/29/2003

CLINICAL DATA: Acute onset of generalized chest pain, shortness of
breath, sore throat, cough and congestion. Initial encounter.

EXAM:
CHEST  2 VIEW

[chest pa]
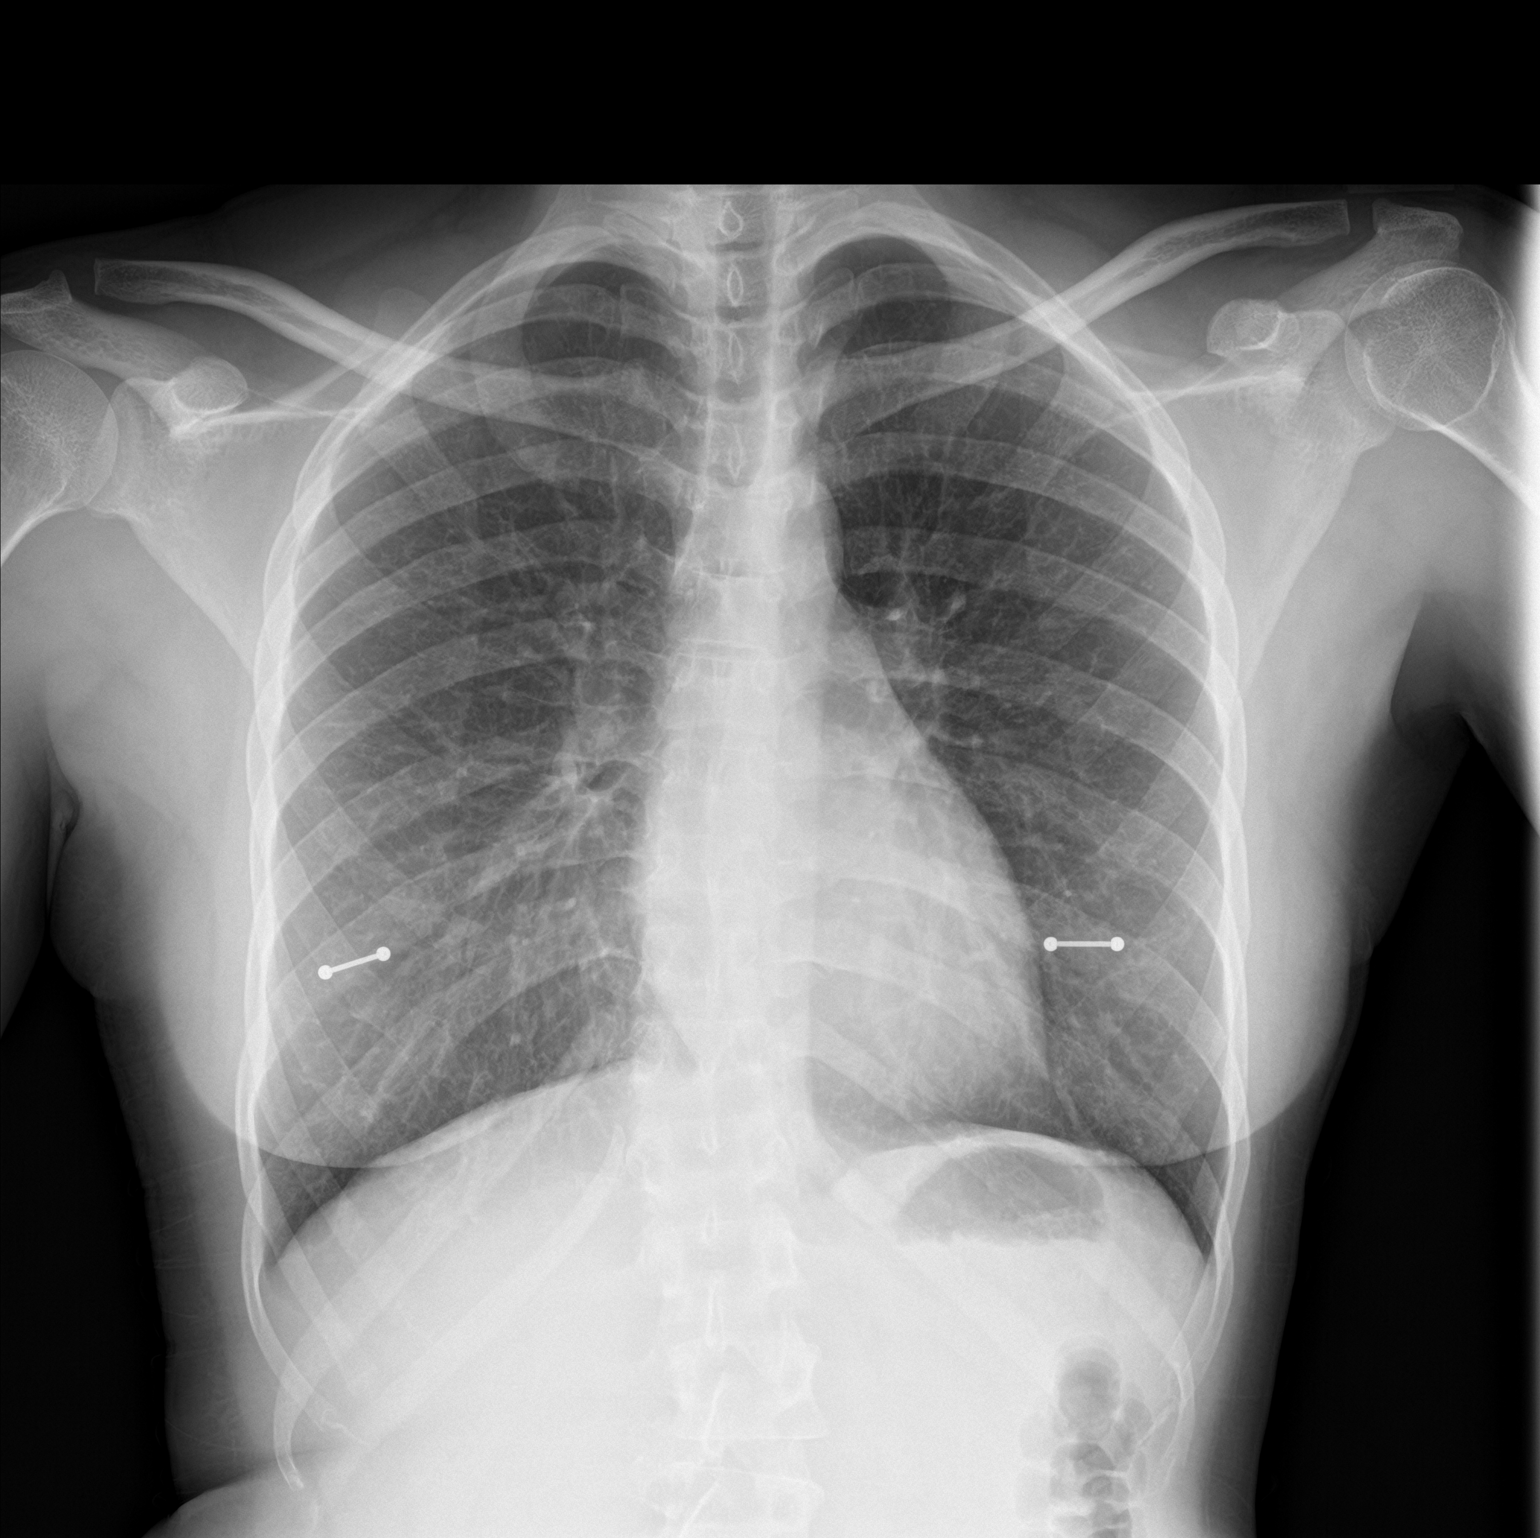

[chest lat]
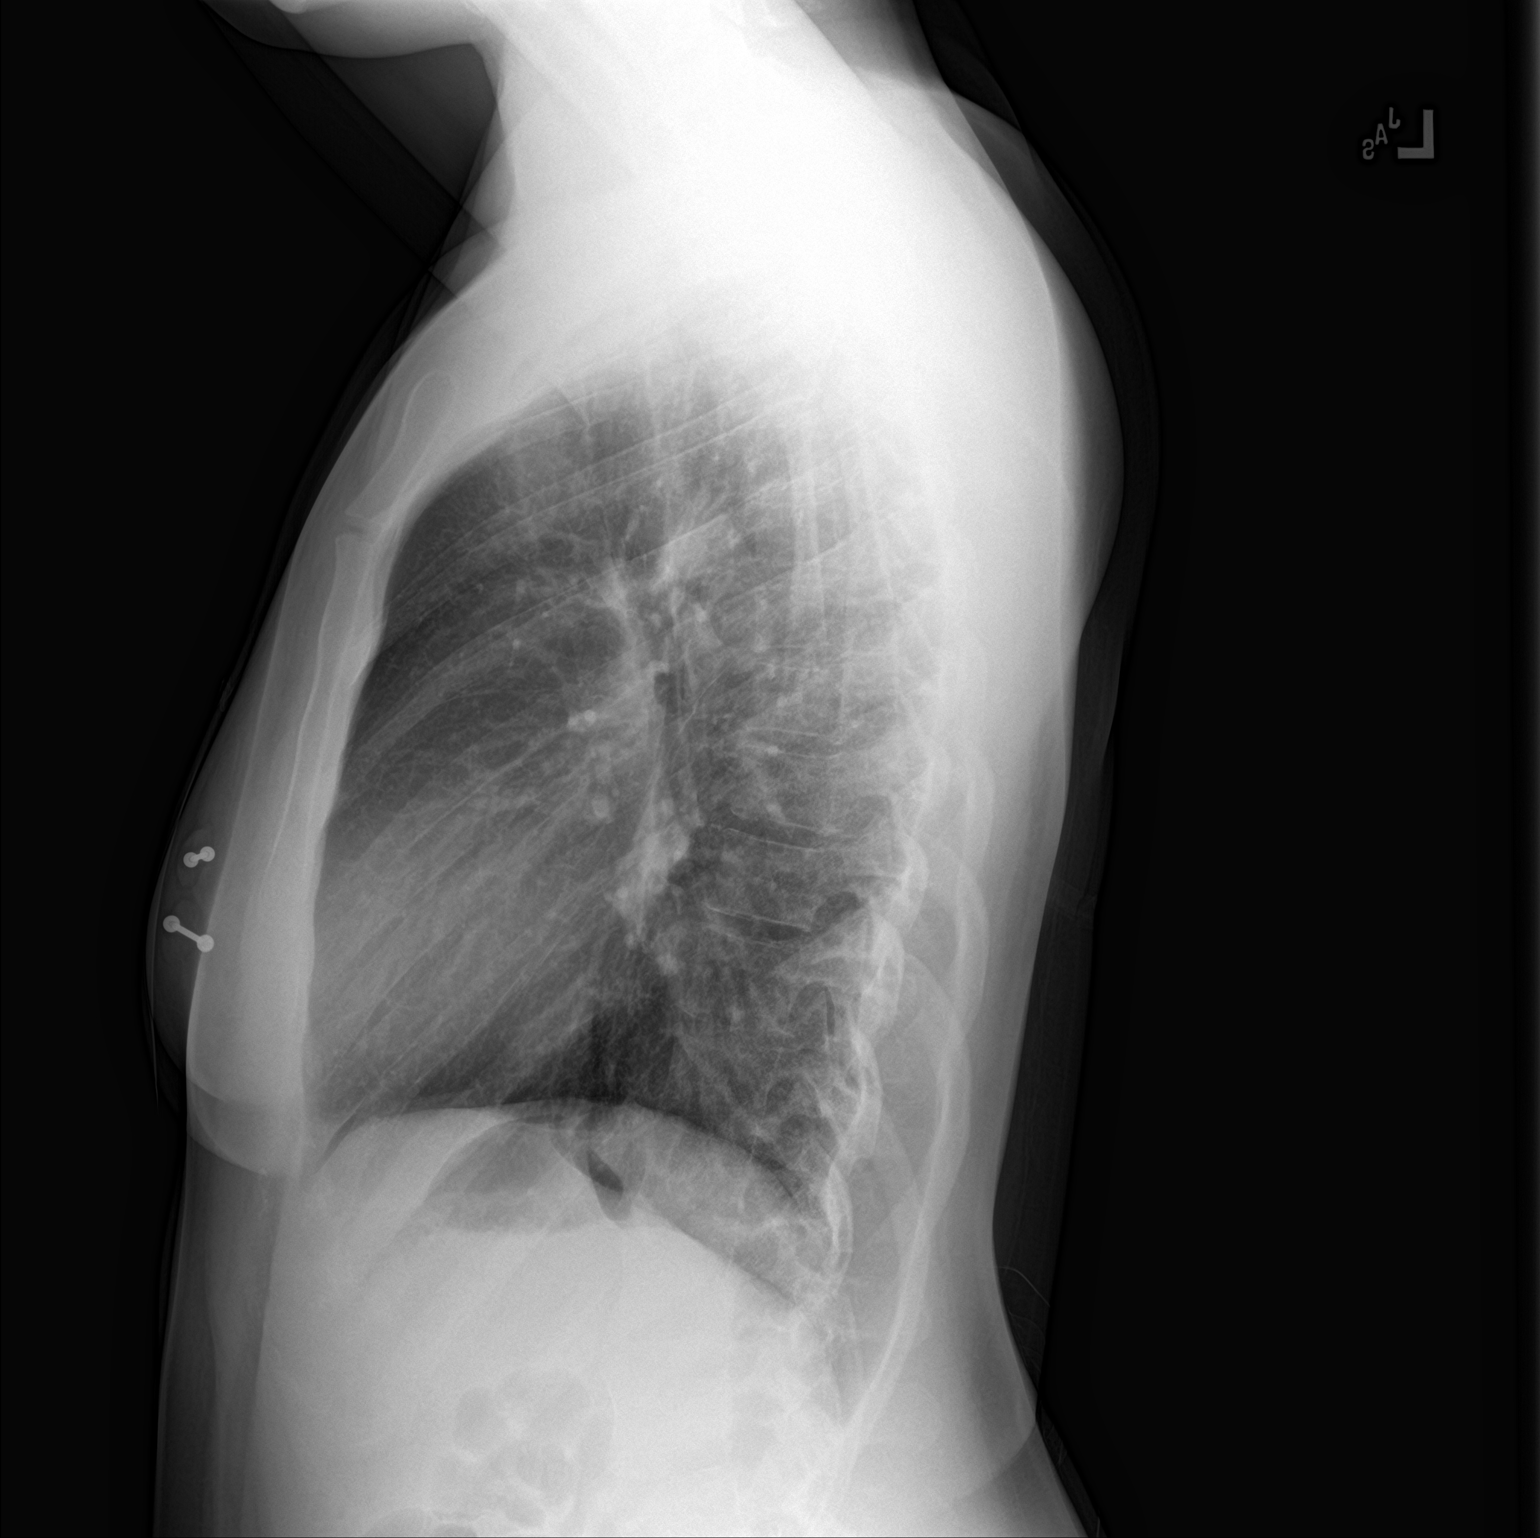

[2 of 2 positions shown; findings below may reference images not displayed]

FINDINGS: The lungs are well-aerated and clear. There is no evidence of focal
opacification, pleural effusion or pneumothorax.

The heart is normal in size; the mediastinal contour is within
normal limits. No acute osseous abnormalities are seen. Bilateral
metallic nipple piercings are noted.
IMPRESSION: No acute cardiopulmonary process seen.

## 2018-01-28 ENCOUNTER — Emergency Department
Admission: EM | Admit: 2018-01-28 | Discharge: 2018-01-28 | Disposition: A | Discharge status: Home / Self Care | Payer: Medicaid Other | Attending: Emergency Medicine | Admitting: Emergency Medicine

## 2018-01-28 ENCOUNTER — Other Ambulatory Visit: Payer: Self-pay

## 2018-01-28 ENCOUNTER — Emergency Department: Payer: Medicaid Other

## 2018-01-28 DIAGNOSIS — Y999 Unspecified external cause status: Secondary | ICD-10-CM | POA: Insufficient documentation

## 2018-01-28 DIAGNOSIS — S9031XA Contusion of right foot, initial encounter: Secondary | ICD-10-CM | POA: Insufficient documentation

## 2018-01-28 DIAGNOSIS — J45909 Unspecified asthma, uncomplicated: Secondary | ICD-10-CM | POA: Insufficient documentation

## 2018-01-28 DIAGNOSIS — S99921A Unspecified injury of right foot, initial encounter: Secondary | ICD-10-CM | POA: Diagnosis present

## 2018-01-28 DIAGNOSIS — Y939 Activity, unspecified: Secondary | ICD-10-CM | POA: Diagnosis not present

## 2018-01-28 DIAGNOSIS — Z79899 Other long term (current) drug therapy: Secondary | ICD-10-CM | POA: Diagnosis not present

## 2018-01-28 DIAGNOSIS — W208XXA Other cause of strike by thrown, projected or falling object, initial encounter: Secondary | ICD-10-CM | POA: Diagnosis not present

## 2018-01-28 DIAGNOSIS — Y929 Unspecified place or not applicable: Secondary | ICD-10-CM | POA: Diagnosis not present

## 2018-01-28 MED ORDER — HYDROCODONE-ACETAMINOPHEN 5-325 MG PO TABS
1.0000 | ORAL_TABLET | Freq: Once | ORAL | Status: AC
  Administered 2018-01-28: 1 via ORAL
  Filled 2018-01-28: qty 1

## 2018-01-28 MED ORDER — MELOXICAM 15 MG PO TABS: 15.0000 mg | ORAL_TABLET | Freq: Every day | ORAL | 0 refills | Status: DC

## 2018-01-28 MED ORDER — ONDANSETRON 4 MG PO TBDP: 4.0000 mg | ORAL_TABLET | Freq: Three times a day (TID) | ORAL | 0 refills | Status: DC | PRN

## 2018-01-28 MED ORDER — ONDANSETRON 8 MG PO TBDP
8.0000 mg | ORAL_TABLET | Freq: Once | ORAL | Status: AC
  Administered 2018-01-28: 8 mg via ORAL
  Filled 2018-01-28: qty 1

## 2018-01-28 NOTE — ED Notes (Signed)
Pt states that she thinks that her right foot may be broken. Dropped dresser on it an hour ago. Foot is painful and swollen at top. Family at bedside

## 2018-01-28 NOTE — ED Provider Notes (Signed)
[facility] Emergency Department Provider Note  ____________________________________________  Time seen: Approximately 10:21 PM  I have reviewed the triage vital signs and the nursing notes.   HISTORY  Chief Complaint Foot Pain    HPI Sarah Reese is a 32 y.o. female who presents the emergency department complaining of right foot pain.   Patient reports that she dropped a dresser on it approximately an hour prior to arrival.  Patient reports that she has pain to the mid and proximal foot.  She is unable to bear weight on scene given pain.  No medications for his complaint prior to arrival.  No history of previous injury or fractures to his foot.   Past Medical History:  Diagnosis Date  . Anemia   . Asthma   . Heart murmur    since pregnancy  . Scoliosis     Patient Active Problem List   Diagnosis Date Noted  . Indication for care in labor or delivery 06/19/2016  . Labor and delivery indication for care or intervention 06/19/2016  . GBS (group B Streptococcus carrier), +RV culture, currently pregnant 05/26/2016  . Anemia during pregnancy 04/06/2016  . Cardiac murmur 04/04/2016  . Supervision of normal pregnancy, antepartum 12/02/2015    Past Surgical History:  Procedure Laterality Date  . DILATION AND CURETTAGE OF UTERUS    . WISDOM TOOTH EXTRACTION      Prior to Admission medications   Medication Sig Start Date End Date Taking? Authorizing Provider  albuterol (PROVENTIL HFA;VENTOLIN HFA) 108 (90 Base) MCG/ACT inhaler Inhale 2 puffs into the lungs every 6 (six) hours as needed for wheezing or shortness of breath.    [provider]  docusate sodium (COLACE) 100 MG capsule Take 100 mg by mouth 2 (two) times daily.    [provider]  ferrous sulfate 325 (65 FE) MG EC tablet Take 325 mg by mouth 3 (three) times daily with meals.    [provider]  Levonorgestrel-Ethinyl Estradiol (AMETHIA) 0.15-0.03 &0.01 MG  tablet Take 1 tablet by mouth daily. 07/27/16   Kandis Cocking A, CNM  meloxicam (MOBIC) 15 MG tablet Take 1 tablet (15 mg total) by mouth daily. 01/28/18   Cuthriell, Charline Bills, PA-C  ondansetron (ZOFRAN-ODT) 4 MG disintegrating tablet Take 1 tablet (4 mg total) by mouth every 8 (eight) hours as needed for nausea or vomiting. 01/28/18   Cuthriell, Charline Bills, PA-C    Allergies Patient has no known allergies.  Family History  Problem Relation Age of Onset  . Hypertension Mother   . Hypertension Maternal Grandmother     Social History Social History   Tobacco Use  . Smoking status: Never Smoker  . Smokeless tobacco: Never Used  Substance Use Topics  . Alcohol use: No  . Drug use: No     Review of Systems  Constitutional: No fever/chills Eyes: No visual changes. No discharge Cardiovascular: no chest pain. Respiratory: no cough. No SOB. Gastrointestinal: No abdominal pain.  No nausea, no vomiting.   Musculoskeletal: Negative for musculoskeletal pain. Skin: Negative for rash, abrasions, lacerations, ecchymosis. Neurological: Negative for headaches, focal weakness or numbness. 10-point ROS otherwise negative.  ____________________________________________   PHYSICAL EXAM:  VITAL SIGNS: ED Triage Vitals  Enc Vitals Group     BP 01/28/18 2120 116/71     Pulse Rate 01/28/18 2120 (!) 59     Resp 01/28/18 2120 20     Temp 01/28/18 2120 98.1 F (36.7 C)     Temp Source  01/28/18 2120 Oral     SpO2 01/28/18 2120 100 %     Weight 01/28/18 2121 140 lb (63.5 kg)     Height 01/28/18 2121 5\' 5"  (1.651 m)     Head Circumference --      Peak Flow --      Pain Score 01/28/18 2121 8     Pain Loc --      Pain Edu? --      Excl. in [place]? --      Constitutional: Alert and oriented. Well appearing and in no acute distress. Eyes: Conjunctivae are normal. PERRL. EOMI. Head: Atraumatic. Neck: No stridor.    Cardiovascular: Normal rate, regular rhythm. Normal S1 and S2.  Good  peripheral circulation. Respiratory: Normal respiratory effort without tachypnea or retractions. Lungs CTAB. Good air entry to the bases with no decreased or absent breath sounds. Musculoskeletal: Full range of motion to all extremities. No gross deformities appreciated.  Visualization of the right foot reveals no visible signs of trauma with no ecchymosis, abrasions, lacerations, gross edema, deformity.  Patient is very tender to palpation along the tarsal bones, primarily proximal tarsal bones.  No palpable abnormality or deficit.  Dorsalis pedis pulse intact.  Sensation intact all 5 digits. Neurologic:  Normal speech and language. No gross focal neurologic deficits are appreciated.  Skin:  Skin is warm, dry and intact. No rash noted. Psychiatric: Mood and affect are normal. Speech and behavior are normal. Patient exhibits appropriate insight and judgement.   ____________________________________________   LABS (all labs ordered are listed, but only abnormal results are displayed)  Labs Reviewed - No data to display ____________________________________________  EKG   ____________________________________________  RADIOLOGY I personally viewed and evaluated these images as part of my medical decision making, as well as reviewing the written report by the radiologist.  I concur with radiologist with no acute osseous abnormality.  Dg Foot Complete Right  Result Date: 01/28/2018 CLINICAL DATA:  Crush injury.  Pain across metatarsals. EXAM: RIGHT FOOT COMPLETE - 3+ VIEW COMPARISON:  None. FINDINGS: There is no evidence of fracture or dislocation. There is no evidence of arthropathy or other focal bone abnormality. Soft tissues are unremarkable. IMPRESSION: Negative. Electronically Signed   By: Misty Stanley M.D.   On: 01/28/2018 22:12    ____________________________________________    PROCEDURES  Procedure(s) performed:    Procedures    Medications  HYDROcodone-acetaminophen  (NORCO/VICODIN) 5-325 MG per tablet 1 tablet (1 tablet Oral Given 01/28/18 2238)  ondansetron (ZOFRAN-ODT) disintegrating tablet 8 mg (8 mg Oral Given 01/28/18 2238)     ____________________________________________   INITIAL IMPRESSION / ASSESSMENT AND PLAN / ED COURSE  Pertinent labs & imaging results that were available during my care of the patient were reviewed by me and considered in my medical decision making (see chart for details).  Review of the [place] CSRS was performed in accordance of the [place] prior to dispensing any controlled drugs.      Patient's diagnosis is consistent with foot contusion.  Patient presents emergency department with right foot pain after dropping a dresser on her foot.  X-ray reveals no acute osseous abnormality.  Exam is reassuring.  Discharged with crutches, meloxicam.  Patient reports that medications make her stomach upset and she is also prescribed Zofran for same.  Follow-up primary care as needed..  Patient is given ED precautions to return to the ED for any worsening or new symptoms.     ____________________________________________  FINAL CLINICAL IMPRESSION(S) / ED DIAGNOSES  Final diagnoses:  Contusion of right foot, initial encounter      NEW MEDICATIONS STARTED DURING THIS VISIT:  ED Discharge Orders         Ordered    meloxicam (MOBIC) 15 MG tablet  Daily     01/28/18 2229    ondansetron (ZOFRAN-ODT) 4 MG disintegrating tablet  Every 8 hours PRN     01/28/18 2229              This chart was dictated using voice recognition software/Dragon. Despite best efforts to proofread, errors can occur which can change the meaning. Any change was purely unintentional.    Darletta Moll, PA-C 01/28/18 2347    Carrie Mew, MD 01/30/18 1910

## 2018-01-28 NOTE — ED Notes (Signed)
Patient transported to X-ray 

## 2018-01-28 NOTE — ED Triage Notes (Signed)
Reports dresser drawer dropped on her foot.  Patient reports right foot pain.

## 2018-04-14 ENCOUNTER — Other Ambulatory Visit: Payer: Self-pay

## 2018-04-14 ENCOUNTER — Emergency Department
Admission: EM | Admit: 2018-04-14 | Discharge: 2018-04-14 | Disposition: A | Discharge status: Home / Self Care | Payer: Medicaid Other | Attending: Emergency Medicine | Admitting: Emergency Medicine

## 2018-04-14 ENCOUNTER — Encounter: Payer: Self-pay | Admitting: Emergency Medicine

## 2018-04-14 DIAGNOSIS — Z79899 Other long term (current) drug therapy: Secondary | ICD-10-CM | POA: Insufficient documentation

## 2018-04-14 DIAGNOSIS — K529 Noninfective gastroenteritis and colitis, unspecified: Secondary | ICD-10-CM | POA: Diagnosis not present

## 2018-04-14 DIAGNOSIS — R1110 Vomiting, unspecified: Secondary | ICD-10-CM | POA: Diagnosis present

## 2018-04-14 DIAGNOSIS — J45909 Unspecified asthma, uncomplicated: Secondary | ICD-10-CM | POA: Insufficient documentation

## 2018-04-14 MED ORDER — ONDANSETRON 4 MG PO TBDP: 4.0000 mg | ORAL_TABLET | Freq: Three times a day (TID) | ORAL | 0 refills | Status: DC | PRN

## 2018-04-14 MED ORDER — ONDANSETRON 4 MG PO TBDP
4.0000 mg | ORAL_TABLET | Freq: Once | ORAL | Status: AC
Start: 2018-04-14 — End: 2018-04-14
  Administered 2018-04-14: 4 mg via ORAL
  Filled 2018-04-14: qty 1

## 2018-04-14 NOTE — ED Provider Notes (Signed)
[facility] Emergency Department Provider Note  ____________________________________________  Time seen: Approximately 4:13 PM  I have reviewed the triage vital signs and the nursing notes.   HISTORY  Chief Complaint Emesis    HPI Sarah Reese is a 33 y.o. female who presents to the emergency department for treatment and evaluation of vomiting and diarrhea.  Symptoms started this morning.  She states that she has had 3 episodes of vomiting and 4 episodes of diarrhea today.  Her 2 children are here with the same symptoms.  She states they were exposed to someone else that had vomiting and diarrhea a couple of days ago.  Past Medical History:  Diagnosis Date  . Anemia   . Asthma   . Heart murmur    since pregnancy  . Scoliosis     Patient Active Problem List   Diagnosis Date Noted  . Indication for care in labor or delivery 06/19/2016  . Labor and delivery indication for care or intervention 06/19/2016  . GBS (group B Streptococcus carrier), +RV culture, currently pregnant 05/26/2016  . Anemia during pregnancy 04/06/2016  . Cardiac murmur 04/04/2016  . Supervision of normal pregnancy, antepartum 12/02/2015    Past Surgical History:  Procedure Laterality Date  . DILATION AND CURETTAGE OF UTERUS    . WISDOM TOOTH EXTRACTION      Prior to Admission medications   Medication Sig Start Date End Date Taking? Authorizing Provider  albuterol (PROVENTIL HFA;VENTOLIN HFA) 108 (90 Base) MCG/ACT inhaler Inhale 2 puffs into the lungs every 6 (six) hours as needed for wheezing or shortness of breath.    [provider]  docusate sodium (COLACE) 100 MG capsule Take 100 mg by mouth 2 (two) times daily.    [provider]  ferrous sulfate 325 (65 FE) MG EC tablet Take 325 mg by mouth 3 (three) times daily with meals.    [provider]  Levonorgestrel-Ethinyl Estradiol (AMETHIA) 0.15-0.03 &0.01 MG tablet Take 1 tablet by mouth  daily. 07/27/16   Kandis Cocking A, CNM  meloxicam (MOBIC) 15 MG tablet Take 1 tablet (15 mg total) by mouth daily. 01/28/18   Cuthriell, Charline Bills, PA-C  ondansetron (ZOFRAN-ODT) 4 MG disintegrating tablet Take 1 tablet (4 mg total) by mouth every 8 (eight) hours as needed for nausea or vomiting. 04/14/18   Sherrie George B, FNP    Allergies Patient has no known allergies.  Family History  Problem Relation Age of Onset  . Hypertension Mother   . Hypertension Maternal Grandmother     Social History Social History   Tobacco Use  . Smoking status: Never Smoker  . Smokeless tobacco: Never Used  Substance Use Topics  . Alcohol use: No  . Drug use: No    Review of Systems Constitutional: Negative for fever. Respiratory: Negative for shortness of breath or cough. Gastrointestinal: Negative for for abdominal pain; positive for nausea , positive for vomiting.  Positive for diarrhea Genitourinary: Negative for dysuria. Musculoskeletal: Negative for back pain. Skin: Negative for acute skin changes/rash/lesion. ____________________________________________   PHYSICAL EXAM:  VITAL SIGNS: ED Triage Vitals  Enc Vitals Group     BP 04/14/18 1538 121/66     Pulse Rate 04/14/18 1538 83     Resp 04/14/18 1538 20     Temp 04/14/18 1538 98.6 F (37 C)     Temp Source 04/14/18 1538 Oral     SpO2 04/14/18 1538 100 %     Weight 04/14/18 1539 145 lb (  65.8 kg)     Height 04/14/18 1539 5\' 5"  (1.651 m)     Head Circumference --      Peak Flow --      Pain Score 04/14/18 1539 7     Pain Loc --      Pain Edu? --      Excl. in [place]? --     Constitutional: Alert and oriented. Well appearing and in no acute distress. Eyes: Conjunctivae are normal. Head: Atraumatic. Nose: No congestion/rhinnorhea. Mouth/Throat: Mucous membranes are moist. Respiratory: Normal respiratory effort.  No retractions.  Breath sounds clear to auscultation. Gastrointestinal: Bowel sounds active x 4; Abdomen is soft  without rebound or guarding. Genitourinary: Pelvic exam: Not indicated Musculoskeletal: No extremity tenderness nor edema.  Neurologic:  Normal speech and language. No gross focal neurologic deficits are appreciated. Speech is normal. No gait instability. Skin:  Skin is warm, dry and intact. No rash noted on exposed skin. Psychiatric: Mood and affect are normal. Speech and behavior are normal.  ____________________________________________   LABS (all labs ordered are listed, but only abnormal results are displayed)  Labs Reviewed - No data to display ____________________________________________  RADIOLOGY  Not indicated. ____________________________________________  Procedures  ____________________________________________  33 year old female presenting to the emergency department for treatment and evaluation of nausea, vomiting, and diarrhea that started today.  Last episode of vomiting was approximately 3 hours ago, but the patient states that she is having frequent diarrhea.  ----------------------------------------- 5:46 PM on 04/14/2018 ----------------------------------------- No further diarrhea or vomiting. She was able to tolerate popsicle after zofran. She will be discharged home and will follow up with PCP or return for symptoms that change, worsen, or for new concerns.  INITIAL IMPRESSION / ASSESSMENT AND PLAN / ED COURSE  Pertinent labs & imaging results that were available during my care of the patient were reviewed by me and considered in my medical decision making (see chart for details).  ____________________________________________   FINAL CLINICAL IMPRESSION(S) / ED DIAGNOSES  Final diagnoses:  Gastroenteritis    Note:  This document was prepared using Dragon voice recognition software and may include unintentional dictation errors.    Victorino Dike, FNP 04/14/18 [place], [place], MD 04/15/18 1006

## 2018-04-14 NOTE — ED Triage Notes (Signed)
Part of family of 3 checking in for vomiting and diarrhea since yesterday. NAD in triage.

## 2018-04-14 NOTE — Discharge Instructions (Signed)
Stay well hydrated. Avoid juices and sugary foods. Follow up with the primary care provider of your choice if not improving over the next few days. Return to the ER for symptoms that change or worsen or for new concerns if unable to schedule an appointment.

## 2018-04-16 ENCOUNTER — Other Ambulatory Visit: Payer: Self-pay

## 2018-04-16 ENCOUNTER — Emergency Department: Payer: Medicaid Other

## 2018-04-16 DIAGNOSIS — Z79899 Other long term (current) drug therapy: Secondary | ICD-10-CM | POA: Diagnosis not present

## 2018-04-16 DIAGNOSIS — W01198A Fall on same level from slipping, tripping and stumbling with subsequent striking against other object, initial encounter: Secondary | ICD-10-CM | POA: Insufficient documentation

## 2018-04-16 DIAGNOSIS — R079 Chest pain, unspecified: Secondary | ICD-10-CM | POA: Diagnosis present

## 2018-04-16 DIAGNOSIS — Y929 Unspecified place or not applicable: Secondary | ICD-10-CM | POA: Insufficient documentation

## 2018-04-16 DIAGNOSIS — J45909 Unspecified asthma, uncomplicated: Secondary | ICD-10-CM | POA: Diagnosis not present

## 2018-04-16 DIAGNOSIS — S90121A Contusion of right lesser toe(s) without damage to nail, initial encounter: Secondary | ICD-10-CM | POA: Insufficient documentation

## 2018-04-16 DIAGNOSIS — Y999 Unspecified external cause status: Secondary | ICD-10-CM | POA: Diagnosis not present

## 2018-04-16 DIAGNOSIS — Y9389 Activity, other specified: Secondary | ICD-10-CM | POA: Diagnosis not present

## 2018-04-16 LAB — CBC
HCT: 35.7 % — ABNORMAL LOW (ref 36.0–46.0)
Hemoglobin: 11.8 g/dL — ABNORMAL LOW (ref 12.0–15.0)
MCH: 29.5 pg (ref 26.0–34.0)
MCHC: 33.1 g/dL (ref 30.0–36.0)
MCV: 89.3 fL (ref 80.0–100.0)
Platelets: 241 10*3/uL (ref 150–400)
RBC: 4 MIL/uL (ref 3.87–5.11)
RDW: 12.3 % (ref 11.5–15.5)
WBC: 5.2 10*3/uL (ref 4.0–10.5)
nRBC: 0 % (ref 0.0–0.2)

## 2018-04-16 LAB — BASIC METABOLIC PANEL
Anion gap: 4 — ABNORMAL LOW (ref 5–15)
BUN: 9 mg/dL (ref 6–20)
CO2: 26 mmol/L (ref 22–32)
Calcium: 8.6 mg/dL — ABNORMAL LOW (ref 8.9–10.3)
Chloride: 108 mmol/L (ref 98–111)
Creatinine, Ser: 0.84 mg/dL (ref 0.44–1.00)
GFR calc Af Amer: >60 mL/min (ref >60)
GFR calc non Af Amer: >60 mL/min (ref >60)
Glucose, Bld: 82 mg/dL (ref 70–99)
Potassium: 3.5 mmol/L (ref 3.5–5.1)
Sodium: 138 mmol/L (ref 135–145)

## 2018-04-16 LAB — POCT PREGNANCY, URINE: Preg Test, Ur: NEGATIVE

## 2018-04-16 LAB — TROPONIN I: Troponin I: <0.03 ng/mL (ref <0.03)

## 2018-04-16 MED ORDER — SODIUM CHLORIDE 0.9% FLUSH: 3.0000 mL | Freq: Once | INTRAVENOUS | Status: DC

## 2018-04-16 NOTE — ED Triage Notes (Signed)
Pt arrives to ED via POV from home with c/o non-radiating centralized chest pain x30 minutes. Pt denies c/o N/V/D or fever. Pt denies previous cardiac h/x. Pt also c/o pinkie toe pain on the right foot after injuring it when she tripped over something at home. No obvious deformity or dislocation, CMS intact.

## 2018-04-17 ENCOUNTER — Emergency Department
Admission: EM | Admit: 2018-04-17 | Discharge: 2018-04-17 | Disposition: A | Discharge status: Home / Self Care | Payer: Medicaid Other | Attending: Emergency Medicine | Admitting: Emergency Medicine

## 2018-04-17 DIAGNOSIS — R079 Chest pain, unspecified: Secondary | ICD-10-CM

## 2018-04-17 DIAGNOSIS — S90121A Contusion of right lesser toe(s) without damage to nail, initial encounter: Secondary | ICD-10-CM

## 2018-04-17 NOTE — ED Provider Notes (Signed)
[facility] Emergency Department Provider Note  ____________________________________________   First MD Initiated Contact with Patient 04/17/18 [phone]     (approximate)  I have reviewed the triage vital signs and the nursing notes.   HISTORY  Chief Complaint Chest Pain; Fall; and Toe Pain    HPI Sarah Reese is a 33 y.o. female with medical issues as listed below who presents for evaluation of chest pain and pain in her right little toe.  she reports some sharp pain in her chest that started much earlier today and has completely resolved.  It lasted about 30 minutes.  She has no history of trauma.  Nothing in particular made it better or worse.  She denies shortness of breath , nausea, vomiting, sweating, abdominal pain.  She reports that while her chest was hurting, she tripped over something and hit her foot into a wall.  She says that her toe continues to hurt her even though the chest pain is gone.  She denies fever/chills or any other recent infectious symptoms.  She is able to move her toe and foot without difficulty and the pain is isolated to the tip of the right little toe.  There is no deformity or swelling.    She has no history of blood clots in the legs of the lungs, does not use birth control, has not been on any long trips or had any immobilizations.     Past Medical History:  Diagnosis Date  . Anemia   . Asthma   . Heart murmur    since pregnancy  . Scoliosis     Patient Active Problem List   Diagnosis Date Noted  . Indication for care in labor or delivery 06/19/2016  . Labor and delivery indication for care or intervention 06/19/2016  . GBS (group B Streptococcus carrier), +RV culture, currently pregnant 05/26/2016  . Anemia during pregnancy 04/06/2016  . Cardiac murmur 04/04/2016  . Supervision of normal pregnancy, antepartum 12/02/2015    Past Surgical History:  Procedure Laterality Date  . DILATION AND CURETTAGE OF  UTERUS    . WISDOM TOOTH EXTRACTION      Prior to Admission medications   Medication Sig Start Date End Date Taking? Authorizing Provider  albuterol (PROVENTIL HFA;VENTOLIN HFA) 108 (90 Base) MCG/ACT inhaler Inhale 2 puffs into the lungs every 6 (six) hours as needed for wheezing or shortness of breath.    [provider]  docusate sodium (COLACE) 100 MG capsule Take 100 mg by mouth 2 (two) times daily.    [provider]  ferrous sulfate 325 (65 FE) MG EC tablet Take 325 mg by mouth 3 (three) times daily with meals.    [provider]  Levonorgestrel-Ethinyl Estradiol (AMETHIA) 0.15-0.03 &0.01 MG tablet Take 1 tablet by mouth daily. 07/27/16   Kandis Cocking A, CNM  meloxicam (MOBIC) 15 MG tablet Take 1 tablet (15 mg total) by mouth daily. 01/28/18   Cuthriell, Charline Bills, PA-C  ondansetron (ZOFRAN-ODT) 4 MG disintegrating tablet Take 1 tablet (4 mg total) by mouth every 8 (eight) hours as needed for nausea or vomiting. 04/14/18   Sherrie George B, FNP    Allergies Patient has no known allergies.  Family History  Problem Relation Age of Onset  . Hypertension Mother   . Hypertension Maternal Grandmother     Social History Social History   Tobacco Use  . Smoking status: Never Smoker  . Smokeless tobacco: Never Used  Substance Use Topics  .  Alcohol use: No  . Drug use: No    Review of Systems Constitutional: No fever/chills Eyes: No visual changes. ENT: No sore throat. Cardiovascular: Sharp chest pain as described above. Respiratory: Denies shortness of breath. Gastrointestinal: No abdominal pain.  No nausea, no vomiting.  No diarrhea.  No constipation. Genitourinary: Negative for dysuria. Musculoskeletal: Pain in right little toe.  Negative for neck pain.  Negative for back pain. Integumentary: Negative for rash. Neurological: Negative for headaches, focal weakness or numbness.   ____________________________________________   PHYSICAL  EXAM:  VITAL SIGNS: ED Triage Vitals [04/16/18 2230]  Enc Vitals Group     BP 95/82     Pulse Rate 80     Resp 16     Temp 98.8 F (37.1 C)     Temp Source Oral     SpO2 100 %     Weight 65.8 kg (145 lb)     Height 1.651 m (5\' 5" )     Head Circumference      Peak Flow      Pain Score 8     Pain Loc      Pain Edu?      Excl. in [place]?     Constitutional: Alert and oriented. Well appearing and in no acute distress. Eyes: Conjunctivae are normal.  Head: Atraumatic. Nose: No congestion/rhinnorhea. Mouth/Throat: Mucous membranes are moist. Neck: No stridor.  No meningeal signs.   Cardiovascular: Normal rate, regular rhythm. Good peripheral circulation. Grossly normal heart sounds. Respiratory: Normal respiratory effort.  No retractions. Lungs CTAB. Gastrointestinal: Soft and nontender. No distention.  Musculoskeletal: Patient reports pain and tenderness in the right little toe but there is no evidence of contusion, laceration, deformity, nor ecchymosis.  No lower extremity tenderness nor edema. No gross deformities of extremities. Neurologic:  Normal speech and language. No gross focal neurologic deficits are appreciated.  Skin:  Skin is warm, dry and intact. No rash noted. Psychiatric: Mood and affect are normal. Speech and behavior are normal.  ____________________________________________   LABS (all labs ordered are listed, but only abnormal results are displayed)  Labs Reviewed  BASIC METABOLIC PANEL - Abnormal; Notable for the following components:      Result Value   Calcium 8.6 (*)    Anion gap 4 (*)    All other components within normal limits  CBC - Abnormal; Notable for the following components:   Hemoglobin 11.8 (*)    HCT 35.7 (*)    All other components within normal limits  TROPONIN I  POC URINE PREG, ED  POCT PREGNANCY, URINE   ____________________________________________  EKG  ED ECG REPORT I, Hinda Kehr, the attending physician, personally viewed  and interpreted this ECG.  Date: 04/16/2018 EKG Time: 22: 25 Rate: 75 Rhythm: normal sinus rhythm QRS Axis: Right axis deviation Intervals: normal ST/T Wave abnormalities: normal Narrative Interpretation: no evidence of acute ischemia  ____________________________________________  RADIOLOGY I, Hinda Kehr, personally viewed and evaluated these images (plain radiographs) as part of my medical decision making, as well as reviewing the written report by the radiologist.  ED MD interpretation: No evidence of any acute abnormality on chest x-ray nor foot x-ray  Official radiology report(s): Dg Chest 2 View  Result Date: 04/16/2018 CLINICAL DATA:  Chest pain EXAM: CHEST - 2 VIEW COMPARISON:  None. FINDINGS: Heart and mediastinal contours are within normal limits. No focal opacities or effusions. No acute bony abnormality. IMPRESSION: No active cardiopulmonary disease. Electronically Signed   By: Rolm Baptise M.D.  On: 04/16/2018 23:02   Dg Foot Complete Right  Result Date: 04/16/2018 CLINICAL DATA:  Tripped, 5th toe injury. EXAM: RIGHT FOOT COMPLETE - 3+ VIEW COMPARISON:  01/28/2018 FINDINGS: There is no evidence of fracture or dislocation. There is no evidence of arthropathy or other focal bone abnormality. Soft tissues are unremarkable. IMPRESSION: Negative. Electronically Signed   By: Rolm Baptise M.D.   On: 04/16/2018 23:03    ____________________________________________   PROCEDURES   Procedure(s) performed (including Critical Care):  Procedures   ____________________________________________   INITIAL IMPRESSION / MDM / [place] / ED COURSE  As part of my medical decision making, I reviewed the following data within the [facility] notes reviewed and incorporated, Labs reviewed , EKG interpreted , Old chart reviewed, Radiograph reviewed , Notes from prior ED visits and [place] Controlled Substance Database       Differential diagnosis  includes, but is not limited to, musculoskeletal pain, anxiety/panic attack, less likely PE or ACS or pneumonia or pneumothorax.  The patient's chest x-ray is clear and her lab work is within normal limits including a normal metabolic panel, troponin, CBC.  She has no ischemic changes on EKG and her symptoms started well before she got here.  She is low risk for ACS based on HEART score and she is PERC negative.  No indication for repeat troponin.  She is comfortable with the plan for discharge and outpatient follow-up and I encouraged use of over-the-counter ibuprofen and Tylenol according to label instructions.  There is no evidence of fracture or dislocation in her right little toe and I gave her my usual customary return precautions for a musculoskeletal contusion.     ____________________________________________  FINAL CLINICAL IMPRESSION(S) / ED DIAGNOSES  Final diagnoses:  Chest pain, unspecified type  Contusion of fifth toe of right foot, initial encounter     MEDICATIONS GIVEN DURING THIS VISIT:  Medications  sodium chloride flush (NS) 0.9 % injection 3 mL (has no administration in time range)     ED Discharge Orders    None       Note:  This document was prepared using Dragon voice recognition software and may include unintentional dictation errors.   Hinda Kehr, MD 04/17/18 [phone]

## 2018-04-17 NOTE — Discharge Instructions (Signed)
Your workup in the Emergency Department today was reassuring.  We did not find any specific abnormalities.  We recommend you drink plenty of fluids, take your regular medications and/or any new ones prescribed today, and follow up with the doctor(s) listed in these documents as recommended.  Use over-the-counter ibuprofen and/or Tylenol according to label instructions as needed for pain.  Return to the Emergency Department if you develop new or worsening symptoms that concern you.

## 2019-08-16 ENCOUNTER — Other Ambulatory Visit: Payer: Self-pay

## 2019-08-16 ENCOUNTER — Emergency Department
Admission: EM | Admit: 2019-08-16 | Discharge: 2019-08-16 | Disposition: A | Discharge status: Home / Self Care | Payer: Medicaid Other | Attending: Student in an Organized Health Care Education/Training Program | Admitting: Student in an Organized Health Care Education/Training Program

## 2019-08-16 ENCOUNTER — Encounter: Payer: Self-pay | Admitting: Emergency Medicine

## 2019-08-16 DIAGNOSIS — Y999 Unspecified external cause status: Secondary | ICD-10-CM | POA: Insufficient documentation

## 2019-08-16 DIAGNOSIS — Z79899 Other long term (current) drug therapy: Secondary | ICD-10-CM | POA: Diagnosis not present

## 2019-08-16 DIAGNOSIS — S39012A Strain of muscle, fascia and tendon of lower back, initial encounter: Secondary | ICD-10-CM | POA: Insufficient documentation

## 2019-08-16 DIAGNOSIS — S3992XA Unspecified injury of lower back, initial encounter: Secondary | ICD-10-CM | POA: Diagnosis present

## 2019-08-16 DIAGNOSIS — Y9289 Other specified places as the place of occurrence of the external cause: Secondary | ICD-10-CM | POA: Insufficient documentation

## 2019-08-16 DIAGNOSIS — J45909 Unspecified asthma, uncomplicated: Secondary | ICD-10-CM | POA: Diagnosis not present

## 2019-08-16 DIAGNOSIS — Y9389 Activity, other specified: Secondary | ICD-10-CM | POA: Diagnosis not present

## 2019-08-16 MED ORDER — IBUPROFEN 800 MG PO TABS: 800.0000 mg | ORAL_TABLET | Freq: Three times a day (TID) | ORAL | 0 refills | Status: DC | PRN

## 2019-08-16 MED ORDER — CYCLOBENZAPRINE HCL 5 MG PO TABS: 5.0000 mg | ORAL_TABLET | Freq: Three times a day (TID) | ORAL | 0 refills | Status: DC | PRN

## 2019-08-16 MED ORDER — ONDANSETRON 4 MG PO TBDP: 4.0000 mg | ORAL_TABLET | Freq: Three times a day (TID) | ORAL | 0 refills | Status: DC | PRN

## 2019-08-16 MED ORDER — IBUPROFEN 800 MG PO TABS
800.0000 mg | ORAL_TABLET | Freq: Once | ORAL | Status: AC
  Administered 2019-08-16: 800 mg via ORAL
  Filled 2019-08-16: qty 1

## 2019-08-16 MED ORDER — CYCLOBENZAPRINE HCL 10 MG PO TABS
10.0000 mg | ORAL_TABLET | Freq: Once | ORAL | Status: AC
  Administered 2019-08-16: 10 mg via ORAL
  Filled 2019-08-16: qty 1
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
## 2019-08-19 LAB — POCT PREGNANCY, URINE: Preg Test, Ur: NEGATIVE

## 2019-08-25 ENCOUNTER — Other Ambulatory Visit: Payer: Self-pay

## 2019-08-25 ENCOUNTER — Emergency Department
Admission: EM | Admit: 2019-08-25 | Discharge: 2019-08-25 | Disposition: A | Discharge status: Home / Self Care | Payer: Medicaid Other | Attending: Emergency Medicine | Admitting: Emergency Medicine

## 2019-08-25 ENCOUNTER — Emergency Department: Payer: Medicaid Other

## 2019-08-25 DIAGNOSIS — M79671 Pain in right foot: Secondary | ICD-10-CM | POA: Insufficient documentation

## 2019-08-25 DIAGNOSIS — W231XXA Caught, crushed, jammed, or pinched between stationary objects, initial encounter: Secondary | ICD-10-CM | POA: Insufficient documentation

## 2019-08-25 DIAGNOSIS — M79674 Pain in right toe(s): Secondary | ICD-10-CM

## 2019-08-25 DIAGNOSIS — Y999 Unspecified external cause status: Secondary | ICD-10-CM | POA: Insufficient documentation

## 2019-08-25 DIAGNOSIS — Y929 Unspecified place or not applicable: Secondary | ICD-10-CM | POA: Diagnosis not present

## 2019-08-25 DIAGNOSIS — S90414A Abrasion, right lesser toe(s), initial encounter: Secondary | ICD-10-CM | POA: Diagnosis not present

## 2019-08-25 DIAGNOSIS — J45909 Unspecified asthma, uncomplicated: Secondary | ICD-10-CM | POA: Diagnosis not present

## 2019-08-25 DIAGNOSIS — Y939 Activity, unspecified: Secondary | ICD-10-CM | POA: Insufficient documentation

## 2019-08-25 NOTE — Discharge Instructions (Addendum)
You were seen today for right fourth toe pain and swelling.  Your x-ray is negative for acute fracture.  Your toes were buddy taped for comfort.  You may take ibuprofen OTC 400 mg every 8 hours as needed for pain and swelling.  Ice and elevation may also be helpful.  Please follow-up if pain persist or worsens.

## 2019-08-25 NOTE — ED Provider Notes (Signed)
[facility] Emergency Department Provider Note ____________________________________________  Time seen: 2030  I have reviewed the triage vital signs and the nursing notes.  HISTORY  Chief Complaint  Foot Pain   HPI Sarah Reese is a 34 y.o. female presents to the ER today with complaint of right fourth toe pain.  She reports she got this slammed in a screen door.  She describes the pain as throbbing.  The pain does not radiate but the pain is worse with ambulation.  She reports she has sustained an abrasion to the same toe.  She did not take any medications or tried any treatments PTA.  Past Medical History:  Diagnosis Date  . Anemia   . Asthma   . Heart murmur    since pregnancy  . Scoliosis     Patient Active Problem List   Diagnosis Date Noted  . Indication for care in labor or delivery 06/19/2016  . Labor and delivery indication for care or intervention 06/19/2016  . GBS (group B Streptococcus carrier), +RV culture, currently pregnant 05/26/2016  . Anemia during pregnancy 04/06/2016  . Cardiac murmur 04/04/2016  . Supervision of normal pregnancy, antepartum 12/02/2015    Past Surgical History:  Procedure Laterality Date  . DILATION AND CURETTAGE OF UTERUS    . WISDOM TOOTH EXTRACTION      Prior to Admission medications   Not on File    Allergies Patient has no known allergies.  Family History  Problem Relation Age of Onset  . Hypertension Mother   . Hypertension Maternal Grandmother     Social History Social History   Tobacco Use  . Smoking status: Never Smoker  . Smokeless tobacco: Never Used  Substance Use Topics  . Alcohol use: No  . Drug use: No    Review of Systems  Constitutional: Negative for fever. Cardiovascular: Negative for chest pain or chest tightness. Respiratory: Negative for cough or shortness of breath. Musculoskeletal: Positive for right fourth toe pain and swelling.  Negative for right ankle  pain. Skin: Positive for abrasion to fourth toe right foot. Neurological: Negative for  focal weakness, tingling or numbness. ____________________________________________  PHYSICAL EXAM:  VITAL SIGNS: ED Triage Vitals  Enc Vitals Group     BP 08/25/19 1940 118/82     Pulse Rate 08/25/19 1940 82     Resp 08/25/19 1940 16     Temp 08/25/19 1940 99.1 F (37.3 C)     Temp Source 08/25/19 1940 Oral     SpO2 08/25/19 1940 100 %     Weight 08/25/19 1937 156 lb (70.8 kg)     Height 08/25/19 1937 5\' 5"  (1.651 m)     Head Circumference --      Peak Flow --      Pain Score 08/25/19 1937 7     Pain Loc --      Pain Edu? --      Excl. in [place]? --     Constitutional: Alert and oriented. Well appearing and in no distress. Cardiovascular: Normal rate, regular rhythm.  Pedal pulse 2+ bilaterally Respiratory: Normal respiratory effort. No wheezes/rales/rhonchi. Musculoskeletal: Normal flexion, extension and rotation of the right ankle.  Pain with palpation over the fourth toe, right foot.  1+ swelling of the fourth toe right foot. Neurologic:  Normal gait without ataxia. Normal speech and language. No gross focal neurologic deficits are appreciated. Skin: Abrasion noted over DIP, fourth toe right foot. _______________________________________   RADIOLOGY   Imaging Orders  DG Foot Complete Right IMPRESSION:  Negative.      ____________________________________________   INITIAL IMPRESSION / ASSESSMENT AND PLAN / ED COURSE  Pain and Swelling of 4th Toe, Right Foot:  Xray negative for fracture Toes buddy taped Encouraged RICE therapy ______________________________________________  FINAL CLINICAL IMPRESSION(S) / ED DIAGNOSES  Final diagnoses:  Pain and swelling of toe of right foot       Jearld Fenton, NP 08/25/19 2046    Arta Silence, MD 08/25/19 2156

## 2019-08-25 NOTE — ED Triage Notes (Signed)
Patient reports door shut on her right foot.  Reports pain to right foot, especially 4th toe.

## 2019-11-15 ENCOUNTER — Other Ambulatory Visit: Payer: Self-pay | Admitting: Primary Care

## 2019-11-15 DIAGNOSIS — D259 Leiomyoma of uterus, unspecified: Secondary | ICD-10-CM

## 2019-11-15 DIAGNOSIS — R102 Pelvic and perineal pain: Secondary | ICD-10-CM

## 2019-12-13 ENCOUNTER — Ambulatory Visit
Admission: RE | Admit: 2019-12-13 | Discharge: 2019-12-13 | Disposition: A | Discharge status: Home / Self Care | Payer: Medicaid Other | Source: Ambulatory Visit | Attending: Primary Care | Admitting: Primary Care

## 2019-12-13 ENCOUNTER — Other Ambulatory Visit: Payer: Self-pay

## 2019-12-13 DIAGNOSIS — D259 Leiomyoma of uterus, unspecified: Secondary | ICD-10-CM | POA: Diagnosis present

## 2019-12-13 DIAGNOSIS — R102 Pelvic and perineal pain: Secondary | ICD-10-CM | POA: Diagnosis present

## 2020-04-23 ENCOUNTER — Emergency Department: Payer: Medicaid Other

## 2020-04-23 ENCOUNTER — Encounter: Payer: Self-pay | Admitting: Emergency Medicine

## 2020-04-23 ENCOUNTER — Emergency Department
Admission: EM | Admit: 2020-04-23 | Discharge: 2020-04-23 | Disposition: A | Discharge status: Home / Self Care | Payer: Medicaid Other | Attending: Emergency Medicine | Admitting: Emergency Medicine

## 2020-04-23 ENCOUNTER — Other Ambulatory Visit: Payer: Self-pay

## 2020-04-23 DIAGNOSIS — K529 Noninfective gastroenteritis and colitis, unspecified: Secondary | ICD-10-CM

## 2020-04-23 DIAGNOSIS — J45909 Unspecified asthma, uncomplicated: Secondary | ICD-10-CM | POA: Insufficient documentation

## 2020-04-23 DIAGNOSIS — Z20822 Contact with and (suspected) exposure to covid-19: Secondary | ICD-10-CM | POA: Diagnosis not present

## 2020-04-23 DIAGNOSIS — R197 Diarrhea, unspecified: Secondary | ICD-10-CM | POA: Diagnosis present

## 2020-04-23 LAB — URINALYSIS, COMPLETE (UACMP) WITH MICROSCOPIC
Bacteria, UA: NONE SEEN
Bilirubin Urine: NEGATIVE
Glucose, UA: NEGATIVE mg/dL
Ketones, ur: 5 mg/dL — AB
Leukocytes,Ua: NEGATIVE
Nitrite: NEGATIVE
Protein, ur: NEGATIVE mg/dL
Specific Gravity, Urine: 1.027 (ref 1.005–1.030)
pH: 5 (ref 5.0–8.0)

## 2020-04-23 LAB — COMPREHENSIVE METABOLIC PANEL
ALT: 14 U/L (ref 0–44)
AST: 23 U/L (ref 15–41)
Albumin: 4.4 g/dL (ref 3.5–5.0)
Alkaline Phosphatase: 49 U/L (ref 38–126)
Anion gap: 10 (ref 5–15)
BUN: 9 mg/dL (ref 6–20)
CO2: 21 mmol/L — ABNORMAL LOW (ref 22–32)
Calcium: 9.3 mg/dL (ref 8.9–10.3)
Chloride: 107 mmol/L (ref 98–111)
Creatinine, Ser: 0.88 mg/dL (ref 0.44–1.00)
GFR, Estimated: >60 mL/min (ref >60)
Glucose, Bld: 112 mg/dL — ABNORMAL HIGH (ref 70–99)
Potassium: 3.6 mmol/L (ref 3.5–5.1)
Sodium: 138 mmol/L (ref 135–145)
Total Bilirubin: 1.1 mg/dL (ref 0.3–1.2)
Total Protein: 8.3 g/dL — ABNORMAL HIGH (ref 6.5–8.1)

## 2020-04-23 LAB — LIPASE, BLOOD: Lipase: 24 U/L (ref 11–51)

## 2020-04-23 LAB — POC URINE PREG, ED: Preg Test, Ur: NEGATIVE

## 2020-04-23 LAB — CBC
HCT: 36.5 % (ref 36.0–46.0)
Hemoglobin: 12 g/dL (ref 12.0–15.0)
MCH: 29.7 pg (ref 26.0–34.0)
MCHC: 32.9 g/dL (ref 30.0–36.0)
MCV: 90.3 fL (ref 80.0–100.0)
Platelets: 203 10*3/uL (ref 150–400)
RBC: 4.04 MIL/uL (ref 3.87–5.11)
RDW: 12.7 % (ref 11.5–15.5)
WBC: 6.1 10*3/uL (ref 4.0–10.5)
nRBC: 0 % (ref 0.0–0.2)

## 2020-04-23 MED ORDER — KETOROLAC TROMETHAMINE 30 MG/ML IJ SOLN
15.0000 mg | Freq: Once | INTRAMUSCULAR | Status: AC
  Administered 2020-04-23: 15 mg via INTRAVENOUS
  Filled 2020-04-23: qty 1

## 2020-04-23 MED ORDER — ONDANSETRON HCL 4 MG/2ML IJ SOLN
4.0000 mg | Freq: Once | INTRAMUSCULAR | Status: AC
  Administered 2020-04-23: 4 mg via INTRAVENOUS
  Filled 2020-04-23: qty 2

## 2020-04-23 MED ORDER — PROMETHAZINE HCL 25 MG/ML IJ SOLN
12.5000 mg | Freq: Once | INTRAMUSCULAR | Status: AC
  Administered 2020-04-23: 12.5 mg via INTRAVENOUS
  Filled 2020-04-23: qty 1

## 2020-04-23 MED ORDER — SODIUM CHLORIDE 0.9 % IV BOLUS
1000.0000 mL | Freq: Once | INTRAVENOUS | Status: AC
  Administered 2020-04-23: 1000 mL via INTRAVENOUS

## 2020-04-23 MED ORDER — ACETAMINOPHEN 500 MG PO TABS
1000.0000 mg | ORAL_TABLET | Freq: Once | ORAL | Status: AC
  Administered 2020-04-23: 1000 mg via ORAL
  Filled 2020-04-23: qty 2

## 2020-04-23 MED ORDER — ONDANSETRON 4 MG PO TBDP: 4.0000 mg | ORAL_TABLET | Freq: Three times a day (TID) | ORAL | 0 refills | Status: DC | PRN

## 2020-04-23 MED ORDER — IOHEXOL 300 MG/ML  SOLN
100.0000 mL | Freq: Once | INTRAMUSCULAR | Status: AC | PRN
  Administered 2020-04-23: 100 mL via INTRAVENOUS

## 2020-04-23 MED ORDER — ONDANSETRON 4 MG PO TBDP
4.0000 mg | ORAL_TABLET | Freq: Once | ORAL | Status: AC | PRN
  Administered 2020-04-23: 4 mg via ORAL
  Filled 2020-04-23: qty 1

## 2020-04-23 NOTE — Discharge Instructions (Addendum)
Take Tylenol 1 g every 8 hours and ibuprofen 600 every 6 hours with food.  Take Zofran help with nausea.  Your CT scan was negative.  Return to the ER for worsening symptoms or any other concerns

## 2020-04-23 NOTE — ED Provider Notes (Signed)
[facility] Emergency Department Provider Note  ____________________________________________   Event Date/Time   First MD Initiated Contact with Patient 04/23/20 1251     (approximate)  I have reviewed the triage vital signs and the nursing notes.   HISTORY  Chief Complaint Abdominal Pain    HPI Sarah Reese is a 35 y.o. female with anemia, asthma who comes in with abdominal pain.  Patient states that she is not sure if she got a viral bug.  She reports that her girlfriends son had a viral bug and was having nausea, vomiting, diarrhea 3 days ago.  She also states that she ate [employer] last night and 4 hours later developed her symptoms.  She reports having multiple episodes of vomiting that were initially just food.  Then afterwards she developed a little bit of blood streak in her vomit.  She denies any daily drinking, history of ulcers.  She has also had multiple episodes of diarrhea.  She also has been having left lower quadrant pain, nothing makes better, nothing makes it worse, constant, cramping sensation.  No pain on her right lower quadrant.  Patient denies any vaginal discharge or concern for STDs.  She is had 1 partner who is female.  She denies any dysuria.          Past Medical History:  Diagnosis Date  . Anemia   . Asthma   . Heart murmur    since pregnancy  . Scoliosis     Patient Active Problem List   Diagnosis Date Noted  . Indication for care in labor or delivery 06/19/2016  . Labor and delivery indication for care or intervention 06/19/2016  . GBS (group B Streptococcus carrier), +RV culture, currently pregnant 05/26/2016  . Anemia during pregnancy 04/06/2016  . Cardiac murmur 04/04/2016  . Supervision of normal pregnancy, antepartum 12/02/2015    Past Surgical History:  Procedure Laterality Date  . DILATION AND CURETTAGE OF UTERUS    . WISDOM TOOTH EXTRACTION      Prior to Admission medications   Not on File     Allergies Patient has no known allergies.  Family History  Problem Relation Age of Onset  . Hypertension Mother   . Hypertension Maternal Grandmother     Social History Social History   Tobacco Use  . Smoking status: Never Smoker  . Smokeless tobacco: Never Used  Substance Use Topics  . Alcohol use: No  . Drug use: No      Review of Systems Constitutional: No fever/chills Eyes: No visual changes. ENT: No sore throat. Cardiovascular: Denies chest pain. Respiratory: Denies shortness of breath. Gastrointestinal: Positive abdominal pain, nausea, vomiting, diarrhea, blood-streaked vomiting Genitourinary: Negative for dysuria. Musculoskeletal: Negative for back pain. Skin: Negative for rash. Neurological: Negative for headaches, focal weakness or numbness. All other ROS negative ____________________________________________   PHYSICAL EXAM:  VITAL SIGNS: ED Triage Vitals  Enc Vitals Group     BP 04/23/20 1042 111/64     Pulse Rate 04/23/20 1042 (!) 104     Resp 04/23/20 1042 20     Temp 04/23/20 1042 100 F (37.8 C)     Temp Source 04/23/20 1042 Oral     SpO2 04/23/20 1042 99 %     Weight 04/23/20 1043 163 lb (73.9 kg)     Height 04/23/20 1043 5\' 5"  (1.651 m)     Head Circumference --      Peak Flow --      Pain  Score 04/23/20 1057 10     Pain Loc --      Pain Edu? --      Excl. in [place]? --     Constitutional: Alert and oriented. Well appearing and in no acute distress. Eyes: Conjunctivae are normal. EOMI. Head: Atraumatic. Nose: No congestion/rhinnorhea. Mouth/Throat: Mucous membranes are moist.   Neck: No stridor. Trachea Midline. FROM.  No crepitus Cardiovascular: Normal rate, regular rhythm. Grossly normal heart sounds.  Good peripheral circulation. Respiratory: Normal respiratory effort.  No retractions. Lungs CTAB. Gastrointestinal: Soft with slight tenderness in the left lower quadrant without any rebound or guarding no distention. No abdominal  bruits.  Musculoskeletal: No lower extremity tenderness nor edema.  No joint effusions. Neurologic:  Normal speech and language. No gross focal neurologic deficits are appreciated.  Skin:  Skin is warm, dry and intact. No rash noted. Psychiatric: Mood and affect are normal. Speech and behavior are normal. GU: Deferred   ____________________________________________   LABS (all labs ordered are listed, but only abnormal results are displayed)  Labs Reviewed  COMPREHENSIVE METABOLIC PANEL - Abnormal; Notable for the following components:      Result Value   CO2 21 (*)    Glucose, Bld 112 (*)    Total Protein 8.3 (*)    All other components within normal limits  LIPASE, BLOOD  CBC  URINALYSIS, COMPLETE (UACMP) WITH MICROSCOPIC  POC URINE PREG, ED   ____________________________________________   RADIOLOGY   Official radiology report(s): CT ABDOMEN PELVIS W CONTRAST  Result Date: 04/23/2020 CLINICAL DATA:  Vomiting and diarrhea for 1 night, left-sided pain EXAM: CT ABDOMEN AND PELVIS WITH CONTRAST TECHNIQUE: Multidetector CT imaging of the abdomen and pelvis was performed using the standard protocol following bolus administration of intravenous contrast. CONTRAST:  138mL OMNIPAQUE IOHEXOL 300 MG/ML  SOLN COMPARISON:  None. FINDINGS: Lower chest: No acute pleural or parenchymal lung disease. Hepatobiliary: No focal liver abnormality is seen. No gallstones, gallbladder wall thickening, or biliary dilatation. Pancreas: Unremarkable. No pancreatic ductal dilatation or surrounding inflammatory changes. Spleen: Normal in size without focal abnormality. Adrenals/Urinary Tract: Adrenal glands are unremarkable. Kidneys are normal, without renal calculi, focal lesion, or hydronephrosis. Bladder is unremarkable. Stomach/Bowel: No bowel obstruction or ileus. Normal appendix right lower quadrant. No bowel wall thickening or inflammatory change. Vascular/Lymphatic: No significant vascular findings are  present. No enlarged abdominal or pelvic lymph nodes. Reproductive: Uterus and bilateral adnexa are unremarkable. Other: No free fluid or free gas.  No abdominal wall hernia. Musculoskeletal: No acute or destructive bony lesions. Reconstructed images demonstrate no additional findings. IMPRESSION: 1. No acute intra-abdominal or intrapelvic process. Normal appendix. Electronically Signed   By: Randa Ngo M.D.   On: 04/23/2020 15:30    ____________________________________________   PROCEDURES  Procedure(s) performed (including Critical Care):  Procedures   ____________________________________________   INITIAL IMPRESSION / ASSESSMENT AND PLAN / ED COURSE  Sarah Reese was evaluated in Emergency Department on 04/23/2020 for the symptoms described in the history of present illness. She was evaluated in the context of the global COVID-19 pandemic, which necessitated consideration that the patient might be at risk for infection with the SARS-CoV-2 virus that causes COVID-19. Institutional protocols and algorithms that pertain to the evaluation of patients at risk for COVID-19 are in a state of rapid change based on information released by regulatory bodies including the CDC and federal and state organizations. These policies and algorithms were followed during the patient's care in the ED.    Patient is  a well-appearing 34 year old who comes in with nausea, vomiting, diarrhea in the setting of eating [employer] as well as a close contact having a GI bug recently.  She is otherwise healthy.  Her pain is on her left lower quadrant so do not think this is appendicitis.  She has no right upper quadrant tenderness to suggest biliary pathology.  Labs were ordered to evaluate for Electra abnormalities, AKI.  I suspect that her blood-tinged vomiting is secondary to Mallory-Weiss tears given it started after having multiple episodes of vomiting.  It is minimal in nature.  Hemoglobin was ordered to  evaluate for anemia but I have low suspicion given the small amount of bleeding.  Denies any risk factors for ulcers.  Will give symptomatic treatment with IV fluids, Tylenol, Toradol, Zofran.  At this time I think we can hold off on CT imaging due to my high concern of viral pathology.  She denies any antibiotics to suggest C. difficile  Repeat vital signs prior to any medication show that patient is afebrile and has a normal heart rate of 99  White count is normal which is reassuring.  Hemoglobin is at baseline at 11.3.  Her LFTs are normal and lipase are normal making pancreatitis or gallbladder pathology less likely  Repeat evaluation patient stating that her pain is getting worse and that she is now having pain in her right lower quadrant.  Patient would like to get CT scan to further evaluate.  CT without evidence of appendicitis.    Evaluated patient updated on results.  At this time patient is comfortable discharge home provide work note and Zofran continue Tylenol and ibuprofen for pain.  She was unable to give stool sample for test.  She is not had any vomiting in the ER.      ____________________________________________   FINAL CLINICAL IMPRESSION(S) / ED DIAGNOSES   Final diagnoses:  Gastroenteritis      MEDICATIONS GIVEN DURING THIS VISIT:  Medications  ondansetron (ZOFRAN-ODT) disintegrating tablet 4 mg (4 mg Oral Given 04/23/20 1102)  sodium chloride 0.9 % bolus 1,000 mL (0 mLs Intravenous Stopped 04/23/20 1412)  ondansetron (ZOFRAN) injection 4 mg (4 mg Intravenous Given 04/23/20 1313)  ketorolac (TORADOL) 30 MG/ML injection 15 mg (15 mg Intravenous Given 04/23/20 1313)  acetaminophen (TYLENOL) tablet 1,000 mg (1,000 mg Oral Given 04/23/20 1313)  promethazine (PHENERGAN) injection 12.5 mg (12.5 mg Intravenous Given 04/23/20 1450)  iohexol (OMNIPAQUE) 300 MG/ML solution 100 mL (100 mLs Intravenous Contrast Given 04/23/20 1514)     ED Discharge Orders         Ordered     ondansetron (ZOFRAN ODT) 4 MG disintegrating tablet  Every 8 hours PRN        04/23/20 1558           Note:  This document was prepared using Dragon voice recognition software and may include unintentional dictation errors.   Vanessa Neligh, MD 04/23/20 [phone]

## 2020-04-23 NOTE — ED Triage Notes (Signed)
Vomiting and diarrhea since last night . Had eaten at [employer].  Says she vomited so hard that there was some blood in it.  She is tearful and rubbing her abdomen.  Says most pain left side.

## 2020-04-24 LAB — SARS CORONAVIRUS 2 (TAT 6-24 HRS): SARS Coronavirus 2: NEGATIVE

## 2021-02-08 ENCOUNTER — Other Ambulatory Visit: Payer: Self-pay

## 2021-02-08 ENCOUNTER — Encounter: Payer: Self-pay | Admitting: Emergency Medicine

## 2021-02-08 ENCOUNTER — Emergency Department
Admission: EM | Admit: 2021-02-08 | Discharge: 2021-02-08 | Disposition: A | Discharge status: Home / Self Care | Payer: Medicaid Other | Attending: Emergency Medicine | Admitting: Emergency Medicine

## 2021-02-08 DIAGNOSIS — J4521 Mild intermittent asthma with (acute) exacerbation: Secondary | ICD-10-CM

## 2021-02-08 DIAGNOSIS — R0602 Shortness of breath: Secondary | ICD-10-CM | POA: Diagnosis present

## 2021-02-08 MED ORDER — PREDNISONE 20 MG PO TABS
60.0000 mg | ORAL_TABLET | Freq: Once | ORAL | Status: AC
  Administered 2021-02-08: 11:00:00 60 mg via ORAL
  Filled 2021-02-08: qty 3

## 2021-02-08 MED ORDER — PREDNISONE 10 MG PO TABS: ORAL_TABLET | ORAL | 0 refills | Status: DC

## 2021-02-08 MED ORDER — IPRATROPIUM-ALBUTEROL 0.5-2.5 (3) MG/3ML IN SOLN
3.0000 mL | Freq: Once | RESPIRATORY_TRACT | Status: AC
  Administered 2021-02-08: 11:00:00 3 mL via RESPIRATORY_TRACT
  Filled 2021-02-08: qty 3

## 2021-02-08 MED ORDER — IPRATROPIUM-ALBUTEROL 0.5-2.5 (3) MG/3ML IN SOLN
3.0000 mL | Freq: Once | RESPIRATORY_TRACT | Status: AC
  Administered 2021-02-08: 12:00:00 3 mL via RESPIRATORY_TRACT

## 2021-02-08 MED ORDER — ALBUTEROL SULFATE HFA 108 (90 BASE) MCG/ACT IN AERS: 2.0000 | INHALATION_SPRAY | Freq: Four times a day (QID) | RESPIRATORY_TRACT | 1 refills | Status: DC | PRN

## 2021-02-08 NOTE — ED Notes (Signed)
Pt to ED for asthma exascerbation that started this AM. Pt denies wheezing but states feels SOB and has cough and nasal drainage that may be allergies. Has used inhalers but still feels SOB. Lungs are clear but respirations are shallow. Pt in NAD, just with wet cough and nasal drainage.

## 2021-02-08 NOTE — Discharge Instructions (Addendum)
Begin with prednisone tablets tomorrow as you have had the first dose while in the emergency department.  A prescription for your albuterol inhaler was sent to the pharmacy as well to pick up, continue using 2 puffs every 6 hours as needed for wheezing or shortness of breath.  Follow-up with your primary care provider for continued treatment of your asthma.

## 2021-02-08 NOTE — ED Provider Notes (Signed)
[facility] Emergency Department Provider Note  ____________________________________________   Event Date/Time   First MD Initiated Contact with Patient 02/08/21 [phone]     (approximate)  I have reviewed the triage vital signs and the nursing notes.   HISTORY  Chief Complaint Asthma   HPI Maryjayne Iyana Topor is a 35 y.o. female presents to the ED with complaint of wheezing and shortness of breath that started this morning.  Patient has a history of asthma and states that this is no different than her normal asthma flareup.  She has had some coughing but denies any known COVID exposure, fever, chills, nausea or vomiting.  Patient has used her albuterol inhaler without any relief.         Past Medical History:  Diagnosis Date   Anemia    Asthma    Heart murmur    since pregnancy   Scoliosis     Patient Active Problem List   Diagnosis Date Noted   Indication for care in labor or delivery 06/19/2016   Labor and delivery indication for care or intervention 06/19/2016   GBS (group B Streptococcus carrier), +RV culture, currently pregnant 05/26/2016   Anemia during pregnancy 04/06/2016   Cardiac murmur 04/04/2016   Supervision of normal pregnancy, antepartum 12/02/2015    Past Surgical History:  Procedure Laterality Date   DILATION AND CURETTAGE OF UTERUS     WISDOM TOOTH EXTRACTION      Prior to Admission medications   Medication Sig Start Date End Date Taking? Authorizing Provider  albuterol (VENTOLIN HFA) 108 (90 Base) MCG/ACT inhaler Inhale 2 puffs into the lungs every 6 (six) hours as needed for wheezing or shortness of breath. 02/08/21  Yes Johnn Hai, PA-C  predniSONE (DELTASONE) 10 MG tablet Take 6 tablets  today, on day 2 take 5 tablets, day 3 take 4 tablets, day 4 take 3 tablets, day 5 take  2 tablets and 1 tablet the last day 02/08/21  Yes Johnn Hai, PA-C    Allergies Patient has no known allergies.  Family  History  Problem Relation Age of Onset   Hypertension Mother    Hypertension Maternal Grandmother     Social History Social History   Tobacco Use   Smoking status: Never   Smokeless tobacco: Never  Substance Use Topics   Alcohol use: No   Drug use: No    Review of Systems Constitutional: No fever/chills Eyes: No visual changes. ENT: No sore throat. Cardiovascular: Denies chest pain. Respiratory: Positive for shortness of breath.  Positive for cough. Gastrointestinal: No abdominal pain.  No nausea, no vomiting.  No diarrhea.   Musculoskeletal: Negative for body aches. Skin: Negative for rash. Neurological: Negative for headaches, focal weakness or numbness. ____________________________________________   PHYSICAL EXAM:  VITAL SIGNS: ED Triage Vitals  Enc Vitals Group     BP 02/08/21 0924 98/81     Pulse Rate 02/08/21 0924 74     Resp 02/08/21 0924 18     Temp 02/08/21 0924 98.7 F (37.1 C)     Temp Source 02/08/21 0924 Oral     SpO2 02/08/21 0924 100 %     Weight 02/08/21 0920 162 lb 14.7 oz (73.9 kg)     Height 02/08/21 0920 5\' 5"  (1.651 m)     Head Circumference --      Peak Flow --      Pain Score 02/08/21 0920 0     Pain Loc --  Pain Edu? --      Excl. in [place]? --     Constitutional: Alert and oriented. Well appearing and in no acute distress.  Patient is able to answer questions in complete sentences without any difficulty. Eyes: Conjunctivae are normal.  Head: Atraumatic. Nose: No congestion/rhinnorhea. Neck: No stridor.   Cardiovascular: Normal rate, regular rhythm. Grossly normal heart sounds.  Good peripheral circulation. Respiratory: Normal respiratory effort.  No retractions. Lungs minimal expiratory wheeze heard faintly throughout. Gastrointestinal: Soft and nontender. No distention.  Musculoskeletal:  Moves upper and lower extremities with any difficulty.  Normal gait was noted. Neurologic:  Normal speech and language. No gross focal  neurologic deficits are appreciated.  Skin:  Skin is warm, dry and intact. No rash noted. Psychiatric: Mood and affect are normal. Speech and behavior are normal.  ____________________________________________  PROCEDURES  Procedure(s) performed (including Critical Care):  Procedures   ____________________________________________   INITIAL IMPRESSION / ASSESSMENT AND PLAN / ED COURSE  As part of my medical decision making, I reviewed the following data within the electronic MEDICAL RECORD NUMBER Notes from prior ED visits and [place] Controlled Substance Database  35 year old female presents to the ED with complaint of duration of her routine asthma.  Patient has been using her albuterol inhaler but states that she is almost out.  She denies any COVID or influenza-like symptoms.  Patient had some mild expiratory wheezes which improved with a DuoNeb nebulizer treatment and prednisone 60 mg p.o.  Patient will continue with a prednisone taper and a prescription for an albuterol inhaler was sent to her pharmacy.  She is encouraged to follow-up with her PCP if any continued problems and for further treatment of her asthma.  ____________________________________________   FINAL CLINICAL IMPRESSION(S) / ED DIAGNOSES  Final diagnoses:  Mild intermittent asthma with exacerbation     ED Discharge Orders          Ordered    predniSONE (DELTASONE) 10 MG tablet        02/08/21 1032    albuterol (VENTOLIN HFA) 108 (90 Base) MCG/ACT inhaler  Every 6 hours PRN        02/08/21 1032             Note:  This document was prepared using Dragon voice recognition software and may include unintentional dictation errors.    Johnn Hai, PA-C 02/08/21 1154    Blake Divine, MD 02/09/21 [phone]

## 2021-02-08 NOTE — ED Triage Notes (Signed)
Presents with some diff breathing and wheezing this am  hx of asthma  no relief with use of inhaler

## 2021-04-07 ENCOUNTER — Emergency Department
Admission: EM | Admit: 2021-04-07 | Discharge: 2021-04-07 | Disposition: A | Discharge status: Home / Self Care | Payer: Medicaid Other | Attending: Student in an Organized Health Care Education/Training Program | Admitting: Student in an Organized Health Care Education/Training Program

## 2021-04-07 ENCOUNTER — Other Ambulatory Visit: Payer: Self-pay

## 2021-04-07 ENCOUNTER — Encounter: Payer: Self-pay | Admitting: Emergency Medicine

## 2021-04-07 DIAGNOSIS — Z79899 Other long term (current) drug therapy: Secondary | ICD-10-CM | POA: Insufficient documentation

## 2021-04-07 DIAGNOSIS — R110 Nausea: Secondary | ICD-10-CM | POA: Diagnosis present

## 2021-04-07 DIAGNOSIS — J662 Cannabinosis: Secondary | ICD-10-CM | POA: Insufficient documentation

## 2021-04-07 DIAGNOSIS — R42 Dizziness and giddiness: Secondary | ICD-10-CM | POA: Diagnosis not present

## 2021-04-07 LAB — CBC WITH DIFFERENTIAL/PLATELET
Abs Immature Granulocytes: 0.01 10*3/uL (ref 0.00–0.07)
Basophils Absolute: 0 10*3/uL (ref 0.0–0.1)
Basophils Relative: 1 %
Eosinophils Absolute: 0.1 10*3/uL (ref 0.0–0.5)
Eosinophils Relative: 1 %
HCT: 36.9 % (ref 36.0–46.0)
Hemoglobin: 12 g/dL (ref 12.0–15.0)
Immature Granulocytes: 0 %
Lymphocytes Relative: 39 %
Lymphs Abs: 2 10*3/uL (ref 0.7–4.0)
MCH: 29.9 pg (ref 26.0–34.0)
MCHC: 32.5 g/dL (ref 30.0–36.0)
MCV: 91.8 fL (ref 80.0–100.0)
Monocytes Absolute: 0.6 10*3/uL (ref 0.1–1.0)
Monocytes Relative: 11 %
Neutro Abs: 2.5 10*3/uL (ref 1.7–7.7)
Neutrophils Relative %: 48 %
Platelets: 235 10*3/uL (ref 150–400)
RBC: 4.02 MIL/uL (ref 3.87–5.11)
RDW: 12.8 % (ref 11.5–15.5)
WBC: 5.1 10*3/uL (ref 4.0–10.5)
nRBC: 0 % (ref 0.0–0.2)

## 2021-04-07 LAB — BASIC METABOLIC PANEL
Anion gap: 4 — ABNORMAL LOW (ref 5–15)
BUN: 11 mg/dL (ref 6–20)
CO2: 26 mmol/L (ref 22–32)
Calcium: 9.2 mg/dL (ref 8.9–10.3)
Chloride: 107 mmol/L (ref 98–111)
Creatinine, Ser: 0.9 mg/dL (ref 0.44–1.00)
GFR, Estimated: >60 mL/min (ref >60)
Glucose, Bld: 97 mg/dL (ref 70–99)
Potassium: 3.8 mmol/L (ref 3.5–5.1)
Sodium: 137 mmol/L (ref 135–145)

## 2021-04-07 LAB — URINALYSIS, ROUTINE W REFLEX MICROSCOPIC
Bilirubin Urine: NEGATIVE
Glucose, UA: NEGATIVE mg/dL
Ketones, ur: NEGATIVE mg/dL
Nitrite: NEGATIVE
Protein, ur: NEGATIVE mg/dL
Specific Gravity, Urine: 1.021 (ref 1.005–1.030)
pH: 5 (ref 5.0–8.0)

## 2021-04-07 LAB — URINE DRUG SCREEN, QUALITATIVE (ARMC ONLY)
Amphetamines, Ur Screen: NOT DETECTED
Barbiturates, Ur Screen: NOT DETECTED
Benzodiazepine, Ur Scrn: NOT DETECTED
Cannabinoid 50 Ng, Ur ~~LOC~~: POSITIVE — AB
Cocaine Metabolite,Ur ~~LOC~~: NOT DETECTED
MDMA (Ecstasy)Ur Screen: NOT DETECTED
Methadone Scn, Ur: NOT DETECTED
Opiate, Ur Screen: NOT DETECTED
Phencyclidine (PCP) Ur S: NOT DETECTED
Tricyclic, Ur Screen: NOT DETECTED

## 2021-04-07 LAB — ETHANOL: Alcohol, Ethyl (B): <10 mg/dL (ref <10)

## 2021-04-07 LAB — PREGNANCY, URINE: Preg Test, Ur: NEGATIVE

## 2021-04-07 MED ORDER — ONDANSETRON HCL 4 MG/2ML IJ SOLN
4.0000 mg | Freq: Once | INTRAMUSCULAR | Status: AC
  Administered 2021-04-07: 4 mg via INTRAVENOUS
  Filled 2021-04-07: qty 2

## 2021-04-07 MED ORDER — SODIUM CHLORIDE 0.9 % IV BOLUS
1000.0000 mL | Freq: Once | INTRAVENOUS | Status: AC
  Administered 2021-04-07: 1000 mL via INTRAVENOUS

## 2021-04-07 MED ORDER — ONDANSETRON 4 MG PO TBDP: 4.0000 mg | ORAL_TABLET | Freq: Three times a day (TID) | ORAL | 0 refills | Status: DC | PRN
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
## 2021-04-07 NOTE — Discharge Instructions (Signed)
Follow-up with your primary care provider if any continued problems or concerns.  A prescription for Zofran was printed in the event that you need more medication for nausea.  Drink lots of fluids to stay hydrated.
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
## 2021-04-09 LAB — URINE CULTURE

## 2021-04-26 DIAGNOSIS — M6530 Trigger finger, unspecified finger: Secondary | ICD-10-CM

## 2021-04-26 DIAGNOSIS — G5601 Carpal tunnel syndrome, right upper limb: Secondary | ICD-10-CM

## 2021-04-26 HISTORY — DX: Trigger finger, unspecified finger: M65.30

## 2021-04-26 HISTORY — DX: Carpal tunnel syndrome, right upper limb: G56.01

## 2021-08-31 ENCOUNTER — Other Ambulatory Visit: Payer: Self-pay

## 2021-08-31 ENCOUNTER — Encounter: Payer: Self-pay | Admitting: *Deleted

## 2021-08-31 DIAGNOSIS — R079 Chest pain, unspecified: Secondary | ICD-10-CM | POA: Diagnosis not present

## 2021-08-31 DIAGNOSIS — J029 Acute pharyngitis, unspecified: Secondary | ICD-10-CM | POA: Insufficient documentation

## 2021-08-31 LAB — CBC
HCT: 36.8 % (ref 36.0–46.0)
Hemoglobin: 11.6 g/dL — ABNORMAL LOW (ref 12.0–15.0)
MCH: 29.6 pg (ref 26.0–34.0)
MCHC: 31.5 g/dL (ref 30.0–36.0)
MCV: 93.9 fL (ref 80.0–100.0)
Platelets: 219 10*3/uL (ref 150–400)
RBC: 3.92 MIL/uL (ref 3.87–5.11)
RDW: 12.9 % (ref 11.5–15.5)
WBC: 8.2 10*3/uL (ref 4.0–10.5)
nRBC: 0 % (ref 0.0–0.2)

## 2021-08-31 NOTE — ED Triage Notes (Signed)
Pt has a sore throat and chest pain.  Sx began several weeks ago.  Pt also reports chest pain for 30 minutes.  No n/v/d  no sob.  Pt alert speech clear.

## 2021-09-01 ENCOUNTER — Emergency Department
Admission: EM | Admit: 2021-09-01 | Discharge: 2021-09-01 | Disposition: A | Discharge status: Home / Self Care | Payer: Medicaid Other | Attending: Emergency Medicine | Admitting: Emergency Medicine

## 2021-09-01 ENCOUNTER — Emergency Department: Payer: Medicaid Other

## 2021-09-01 DIAGNOSIS — J029 Acute pharyngitis, unspecified: Secondary | ICD-10-CM

## 2021-09-01 LAB — BASIC METABOLIC PANEL
Anion gap: 7 (ref 5–15)
BUN: 9 mg/dL (ref 6–20)
CO2: 25 mmol/L (ref 22–32)
Calcium: 8.9 mg/dL (ref 8.9–10.3)
Chloride: 109 mmol/L (ref 98–111)
Creatinine, Ser: 0.95 mg/dL (ref 0.44–1.00)
GFR, Estimated: >60 mL/min (ref >60)
Glucose, Bld: 85 mg/dL (ref 70–99)
Potassium: 4 mmol/L (ref 3.5–5.1)
Sodium: 141 mmol/L (ref 135–145)

## 2021-09-01 LAB — TROPONIN I (HIGH SENSITIVITY): Troponin I (High Sensitivity): 2 ng/L (ref <18)

## 2021-09-01 LAB — GROUP A STREP BY PCR: Group A Strep by PCR: NOT DETECTED

## 2021-09-01 MED ORDER — DEXAMETHASONE 10 MG/ML FOR PEDIATRIC ORAL USE
10.0000 mg | Freq: Once | INTRAMUSCULAR | Status: AC
  Administered 2021-09-01: 10 mg via ORAL
  Filled 2021-09-01: qty 1

## 2021-09-01 NOTE — ED Provider Notes (Signed)
[facility] Provider Note    Event Date/Time   First MD Initiated Contact with Patient 09/01/21 0413     (approximate)   History   Chief Complaint Sore Throat and Chest Pain   HPI  Sarah Reese is a 36 y.o. female with no significant past medical history who presents to the ED complaining of sore throat.  Patient reports that she has been dealing with approximately 2 weeks of pain in her throat whenever she swallows.  She describes the pain as sharp and stabbing, denies any associated fevers, cough, or shortness of breath.  She does state that the pain has occasionally moved down into her chest but she denies any chest pain currently.  She has not had any abdominal pain, nausea, vomiting, diarrhea, dysuria, or flank pain.  She has not taken anything for her symptoms prior to arrival.     Physical Exam   Triage Vital Signs: ED Triage Vitals  Enc Vitals Group     BP 08/31/21 2329 104/68     Pulse Rate 08/31/21 2329 83     Resp 08/31/21 2329 18     Temp 08/31/21 2329 98.6 F (37 C)     Temp Source 08/31/21 2329 Oral     SpO2 08/31/21 2329 99 %     Weight 08/31/21 2330 158 lb (71.7 kg)     Height 08/31/21 2330 '5\' 5"'$  (1.651 m)     Head Circumference --      Peak Flow --      Pain Score 08/31/21 2330 8     Pain Loc --      Pain Edu? --      Excl. in [place]? --     Most recent vital signs: Vitals:   08/31/21 2329 09/01/21 0415  BP: 104/68 112/70  Pulse: 83 78  Resp: 18 20  Temp: 98.6 F (37 C) 98.1 F (36.7 C)  SpO2: 99% 100%    Constitutional: Alert and oriented. Eyes: Conjunctivae are normal. Head: Atraumatic. Nose: No congestion/rhinnorhea. Mouth/Throat: Mucous membranes are moist.  Posterior oropharynx with mild erythema, no edema or exudates noted. Cardiovascular: Normal rate, regular rhythm. Grossly normal heart sounds.  2+ radial pulses bilaterally. Respiratory: Normal respiratory effort.  No retractions. Lungs  CTAB. Gastrointestinal: Soft and nontender. No distention. Musculoskeletal: No lower extremity tenderness nor edema.  Neurologic:  Normal speech and language. No gross focal neurologic deficits are appreciated.    ED Results / Procedures / Treatments   Labs (all labs ordered are listed, but only abnormal results are displayed) Labs Reviewed  CBC - Abnormal; Notable for the following components:      Result Value   Hemoglobin 11.6 (*)    All other components within normal limits  GROUP A STREP BY PCR  BASIC METABOLIC PANEL  TROPONIN I (HIGH SENSITIVITY)     EKG  ED ECG REPORT I, Blake Divine, the attending physician, personally viewed and interpreted this ECG.   Date: 09/01/2021  EKG Time: 23:34  Rate: 78  Rhythm: normal sinus rhythm  Axis: Normal  Intervals:none  ST&T Change: None  RADIOLOGY Chest x-ray reviewed and interpreted by me with no infiltrate, edema, or effusion.  PROCEDURES:  Critical Care performed: No  Procedures   MEDICATIONS ORDERED IN ED: Medications  dexamethasone (DECADRON) 10 MG/ML injection for Pediatric ORAL use 10 mg (10 mg Oral Given 09/01/21 0427)     IMPRESSION / MDM / ASSESSMENT AND PLAN / ED COURSE  I  reviewed the triage vital signs and the nursing notes.                              36 y.o. female with no significant past medical history who presents to the ED complaining of approximately 2 weeks of sore throat with pain occasionally moving down into her chest.  Patient's presentation is most consistent with acute presentation with potential threat to life or bodily function.  Differential diagnosis includes, but is not limited to, viral pharyngitis, strep pharyngitis, peritonsillar abscess, GERD, ACS, PE, pneumonia, pneumothorax.  Patient well-appearing and in no acute distress, vital signs are unremarkable and she denies any chest pain currently.  Low suspicion for ACS given her atypical symptoms, EKG shows no evidence of  arrhythmia or ischemia and troponin is negative.  Doubt PE given reassuring vital signs and no chest pain currently.  Chest x-ray is unremarkable and additional labs are reassuring with no significant anemia, leukocytosis, AKI, or electrolyte abnormality.  Strep testing is negative and suspect symptoms are related to viral pharyngitis versus GERD.  Oral examination is reassuring with no evidence of PTA.  Patient is appropriate for discharge home, was given dose of Decadron here in the ED and counseled to follow-up with her PCP.  Patient agrees with plan.      FINAL CLINICAL IMPRESSION(S) / ED DIAGNOSES   Final diagnoses:  Viral pharyngitis     Rx / DC Orders   ED Discharge Orders     None        Note:  This document was prepared using Dragon voice recognition software and may include unintentional dictation errors.   Blake Divine, MD 09/01/21 [phone]

## 2021-09-01 NOTE — ED Notes (Signed)
Pt evaluated and d/c by provider in triage

## 2021-09-30 ENCOUNTER — Encounter (HOSPITAL_COMMUNITY): Payer: Self-pay | Admitting: Family Medicine

## 2021-09-30 ENCOUNTER — Inpatient Hospital Stay (HOSPITAL_COMMUNITY)
Admission: AD | Admit: 2021-09-30 | Discharge: 2021-09-30 | Disposition: A | Discharge status: Home / Self Care | Payer: Medicaid Other | Attending: Family Medicine | Admitting: Family Medicine

## 2021-09-30 DIAGNOSIS — R1031 Right lower quadrant pain: Secondary | ICD-10-CM | POA: Diagnosis not present

## 2021-09-30 DIAGNOSIS — O26891 Other specified pregnancy related conditions, first trimester: Secondary | ICD-10-CM | POA: Diagnosis present

## 2021-09-30 DIAGNOSIS — Z3A01 Less than 8 weeks gestation of pregnancy: Secondary | ICD-10-CM

## 2021-09-30 DIAGNOSIS — R1032 Left lower quadrant pain: Secondary | ICD-10-CM | POA: Diagnosis not present

## 2021-09-30 DIAGNOSIS — R109 Unspecified abdominal pain: Secondary | ICD-10-CM

## 2021-09-30 DIAGNOSIS — M4120 Other idiopathic scoliosis, site unspecified: Secondary | ICD-10-CM | POA: Insufficient documentation

## 2021-09-30 DIAGNOSIS — O26899 Other specified pregnancy related conditions, unspecified trimester: Secondary | ICD-10-CM | POA: Diagnosis not present

## 2021-09-30 HISTORY — DX: Other idiopathic scoliosis, site unspecified: M41.20

## 2021-09-30 LAB — URINALYSIS, ROUTINE W REFLEX MICROSCOPIC
Bilirubin Urine: NEGATIVE
Glucose, UA: NEGATIVE mg/dL
Hgb urine dipstick: NEGATIVE
Ketones, ur: NEGATIVE mg/dL
Nitrite: NEGATIVE
Protein, ur: NEGATIVE mg/dL
Specific Gravity, Urine: 1.01 (ref 1.005–1.030)
pH: 7 (ref 5.0–8.0)

## 2021-09-30 LAB — HCG, QUANTITATIVE, PREGNANCY: hCG, Beta Chain, Quant, S: 13 m[IU]/mL — ABNORMAL HIGH (ref <5)

## 2021-09-30 LAB — POCT PREGNANCY, URINE: Preg Test, Ur: NEGATIVE

## 2021-09-30 NOTE — MAU Provider Note (Signed)
Event Date/Time   First Provider Initiated Contact with Patient 09/30/21 1317      S Sarah Reese is a 36 y.o. [id number] patient who presents to MAU today with complaint of abdominal cramping. Reports bilateral lower abdominal cramping that started last night. Denies vaginal bleeding. Reports a faint positive pregnancy test at home last night & again this morning. LMP was 7/18.   O BP 117/72 (BP Location: Right Arm)   Pulse 86   Temp 98.2 F (36.8 C) (Oral)   Resp 15   LMP 09/07/2021   SpO2 100%  Physical Exam Vitals and nursing note reviewed.  Constitutional:      General: She is not in acute distress.    Appearance: She is well-developed.  HENT:     Head: Normocephalic and atraumatic.  Pulmonary:     Effort: Pulmonary effort is normal. No respiratory distress.  Neurological:     Mental Status: She is alert.  Psychiatric:        Mood and Affect: Mood normal.        Behavior: Behavior normal.    Results for orders placed or performed during the hospital encounter of 09/30/21 (from the past 24 hour(s))  Pregnancy, urine POC     Status: None   Collection Time: 09/30/21  1:12 PM  Result Value Ref Range   Preg Test, Ur NEGATIVE NEGATIVE  hCG, quantitative, pregnancy     Status: Abnormal   Collection Time: 09/30/21  1:30 PM  Result Value Ref Range   hCG, Beta Chain, Quant, S 13 (H) <5 mIU/mL     A/P  1. Abdominal cramping affecting pregnancy   2. [redacted] weeks gestation of pregnancy    -UPT negative. HCG 13. Will get f/u HCG in MAU on Sunday -U/a negative. Patient declined vaginal swabs today -Reviewed reasons to return to MAU   Jorje Guild, NP 09/30/2021 3:09 PM

## 2021-09-30 NOTE — Discharge Instructions (Signed)
Return to care  If you have heavier bleeding that soaks through more than 2 pads per hour for an hour or more If you bleed so much that you feel like you might pass out or you do pass out If you have significant abdominal pain that is not improved with Tylenol   
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
## 2021-10-03 ENCOUNTER — Other Ambulatory Visit (HOSPITAL_COMMUNITY): Payer: Medicaid Other

## 2021-10-03 ENCOUNTER — Inpatient Hospital Stay (HOSPITAL_COMMUNITY)
Admission: AD | Admit: 2021-10-03 | Discharge: 2021-10-03 | Disposition: A | Discharge status: Home / Self Care | Payer: Medicaid Other | Attending: Obstetrics and Gynecology | Admitting: Obstetrics and Gynecology

## 2021-10-03 ENCOUNTER — Other Ambulatory Visit: Payer: Self-pay

## 2021-10-03 DIAGNOSIS — O26891 Other specified pregnancy related conditions, first trimester: Secondary | ICD-10-CM | POA: Insufficient documentation

## 2021-10-03 DIAGNOSIS — Z3A01 Less than 8 weeks gestation of pregnancy: Secondary | ICD-10-CM | POA: Insufficient documentation

## 2021-10-03 DIAGNOSIS — O3680X0 Pregnancy with inconclusive fetal viability, not applicable or unspecified: Secondary | ICD-10-CM | POA: Diagnosis not present

## 2021-10-03 DIAGNOSIS — O26899 Other specified pregnancy related conditions, unspecified trimester: Secondary | ICD-10-CM | POA: Diagnosis not present

## 2021-10-03 DIAGNOSIS — R109 Unspecified abdominal pain: Secondary | ICD-10-CM | POA: Diagnosis not present

## 2021-10-03 DIAGNOSIS — R1030 Lower abdominal pain, unspecified: Secondary | ICD-10-CM | POA: Diagnosis present

## 2021-10-03 LAB — HCG, QUANTITATIVE, PREGNANCY: hCG, Beta Chain, Quant, S: 41 m[IU]/mL — ABNORMAL HIGH (ref <5)

## 2021-10-03 NOTE — Discharge Instructions (Signed)
Return to care  If you have heavier bleeding that soaks through more than 2 pads per hour for an hour or more If you bleed so much that you feel like you might pass out or you do pass out If you have significant abdominal pain that is not improved with Tylenol    Safe Medications in Pregnancy   Acne: Benzoyl Peroxide Salicylic Acid  Backache/Headache: Tylenol: 2 regular strength every 4 hours OR              2 Extra strength every 6 hours  Colds/Coughs/Allergies: Benadryl (alcohol free) 25 mg every 6 hours as needed Breath right strips Claritin Cepacol throat lozenges Chloraseptic throat spray Cold-Eeze- up to three times per day Cough drops, alcohol free Flonase (by prescription only) Guaifenesin Mucinex Robitussin DM (plain only, alcohol free) Saline nasal spray/drops Sudafed (pseudoephedrine) & Actifed ** use only after [redacted] weeks gestation and if you do not have high blood pressure Tylenol Vicks Vaporub Zinc lozenges Zyrtec   Constipation: Colace Ducolax suppositories Fleet enema Glycerin suppositories Metamucil Milk of magnesia Miralax Senokot Smooth move tea  Diarrhea: Kaopectate Imodium A-D  *NO pepto Bismol  Hemorrhoids: Anusol Anusol HC Preparation H Tucks  Indigestion: Tums Maalox Mylanta Zantac  Pepcid  Insomnia: Benadryl (alcohol free) '25mg'$  every 6 hours as needed Tylenol PM Unisom, no Gelcaps  Leg Cramps: Tums MagGel  Nausea/Vomiting:  Bonine Dramamine Emetrol Ginger extract Sea bands Meclizine  Nausea medication to take during pregnancy:  Unisom (doxylamine succinate 25 mg tablets) Take one tablet daily at bedtime. If symptoms are not adequately controlled, the dose can be increased to a maximum recommended dose of two tablets daily (1/2 tablet in the morning, 1/2 tablet mid-afternoon and one at bedtime). Vitamin B6 '100mg'$  tablets. Take one tablet twice a day (up to 200 mg per day).  Skin Rashes: Aveeno  products Benadryl cream or '25mg'$  every 6 hours as needed Calamine Lotion 1% cortisone cream  Yeast infection: Gyne-lotrimin 7 Monistat 7  Gum/tooth pain: Anbesol  **If taking multiple medications, please check labels to avoid duplicating the same active ingredients **take medication as directed on the label ** Do not exceed 4000 mg of tylenol in 24 hours **Do not take medications that contain aspirin or ibuprofen    [facility] for [employer] at [facility]  [address] [place], [place] [address]  [place] for [place] at [address], [place], [place] [address]  [place] for [place] at [facility] [address], [place], [place] [address]  [place] for [place] at [facility]  [address] Hom[place], [place] [address]  [phone]  [facility] for [place] at [facility] for [place] (First floor), [place], [place] [address]  [place] for [place] at [facility]  [place], [place], [place] [address]  [place]  [address], [place], [place] [address]  [place], [place], [place] [address]  [phone]  [facility]  [address] Jaclynn Guarneri [place], [place] [address]  [place] [address], [place], [place] [address]  [phone]  [facility]  [address], [place],  [address]  [phone]  [place]   [place],  [place], [place] [address]  [phone]  [facility] [place] [address], [place], [place] [address]   [phone]  [facility]  [address] [place],  [address]  [phone]

## 2021-10-03 NOTE — MAU Provider Note (Signed)
History   Chief Complaint:  HCG Level   Sarah Reese is  36 y.o. [id number] Patient's last menstrual period was 09/07/2021.Marland Kitchen Patient is here for follow up of quantitative HCG and ongoing surveillance of pregnancy status. She is 79w5dweeks gestation  by LMP.    Since her last visit, the patient is without new complaint. The patient reports bleeding as  none now.  She reports continued lower abdominal cramping that is intermittent & typically occurs in the morning.    Her previous Quantitative HCG values are:  Component     Latest Ref Rng 09/30/2021  HCG, Beta Chain, Quant, S     <5 mIU/mL 13 (H)      Physical Exam   Blood pressure 102/66, pulse 81, temperature 98.3 F (36.8 C), temperature source Oral, resp. rate 18, last menstrual period 09/07/2021, SpO2 99 %.  Physical Examination: General appearance - alert, well appearing, and in no distress Mental status - normal mood, behavior, speech, dress, motor activity, and thought processes Eyes - pupils equal and reactive, extraocular eye movements intact, sclera anicteric Chest - normal respiratory effort  Labs: Results for orders placed or performed during the hospital encounter of 10/03/21 (from the past 24 hour(s))  hCG, quantitative, pregnancy   Collection Time: 10/03/21  9:48 AM  Result Value Ref Range   hCG, Beta Chain, Quant, S 41 (H) <5 mIU/mL    Ultrasound Studies:   No results found.  Assessment:   1. Pregnancy of unknown anatomic location   2. Abdominal cramping affecting pregnancy   3. [redacted] weeks gestation of pregnancy     -[facility]  Plan: -Discharge home in stable condition -SAB vs ectopic precautions discussed -Patient advised to follow-up with [facility] on Tuesday for repeat HCG - will get f/u ultrasound if HCG continues to rise appropriately -Patient may return to MAU as needed or if her condition were to change or worsen  EJorje Guild NP 10/03/2021, 11:15 AM

## 2021-10-03 NOTE — MAU Note (Signed)
Sarah Reese is a 36 y.o. at 76w5dhere in MAU reporting: she's here for HCG level.  Reports continues to have abdominal cramping.  Denies VB. LMP: N/A Onset of complaint: last week Pain score: 6/10 Vitals:   10/03/21 0944  BP: 102/66  Pulse: 81  Resp: 18  Temp: 98.3 F (36.8 C)  SpO2: 99%     FHT:N/A Lab orders placed from triage: HCG

## 2021-10-05 ENCOUNTER — Ambulatory Visit (INDEPENDENT_AMBULATORY_CARE_PROVIDER_SITE_OTHER): Payer: Medicaid Other

## 2021-10-05 ENCOUNTER — Other Ambulatory Visit: Payer: Self-pay

## 2021-10-05 VITALS — BP 117/65 | HR 75 | Temp 98.5°F | Wt 156.2 lb

## 2021-10-05 DIAGNOSIS — O3680X0 Pregnancy with inconclusive fetal viability, not applicable or unspecified: Secondary | ICD-10-CM

## 2021-10-05 LAB — BETA HCG QUANT (REF LAB): hCG Quant: 68 m[IU]/mL

## 2021-10-05 NOTE — Progress Notes (Signed)
Beta HCG Follow-up Visit  Sarah Reese presents to [facility] for follow-up beta HCG lab. She was seen in MAU for abdominal pain on 09/30/21 and 10/03/21. Patient denies bleeding today. Reports continued abdominal and pelvic pain rated 5/10; has not taken any pain medication. Discussed with patient that we are following beta HCG levels today. I will call the patient with results.   Beta HCG results: 09/30/21 @ 13:30 13  10/03/21 @ 09:48 41  10/05/21 @ 08:51 68   Results and patient history reviewed with Clayton Lefort, MD, who requests repeat. States this may be a viable pregnancy, but the rise of HCG is lower than expected. Will need another HCG to confirm normal pregnancy. Patient called and informed of plan for follow-up.  [name] 10/05/2021 8:44 AM

## 2021-10-06 ENCOUNTER — Ambulatory Visit: Payer: Medicaid Other

## 2021-10-07 ENCOUNTER — Ambulatory Visit: Payer: Medicaid Other

## 2021-10-07 ENCOUNTER — Ambulatory Visit (INDEPENDENT_AMBULATORY_CARE_PROVIDER_SITE_OTHER): Payer: Medicaid Other

## 2021-10-07 VITALS — BP 100/65 | HR 75

## 2021-10-07 DIAGNOSIS — O3680X0 Pregnancy with inconclusive fetal viability, not applicable or unspecified: Secondary | ICD-10-CM

## 2021-10-07 LAB — BETA HCG QUANT (REF LAB): hCG Quant: 209 m[IU]/mL

## 2021-10-07 NOTE — Progress Notes (Signed)
Beta HCG Follow-up Visit  Sarah Reese presents to [facility] for follow-up beta HCG lab. She has been seen prior at MAU and [facility] for pregnancy of unknown location. Patient denies pain, bleeding today. Discussed with patient that we are following beta HCG levels today. I will call the patient with results.   Beta HCG results: 09/30/21 13  10/03/21 41  10/05/21 68  10/07/21 209   Results and patient history reviewed with Maryagnes Amos, CNM, who states this is an appropriate rise of HCG and patient should be scheduled for [place] in 2 weeks. Patient called and informed of plan for follow-up. [place] scheduled for 10/26/21.  [name] 10/07/2021 9:07 AM

## 2021-10-23 ENCOUNTER — Other Ambulatory Visit: Payer: Self-pay

## 2021-10-23 ENCOUNTER — Emergency Department
Admission: EM | Admit: 2021-10-23 | Discharge: 2021-10-23 | Disposition: A | Discharge status: Home / Self Care | Payer: Medicaid Other | Attending: Emergency Medicine | Admitting: Emergency Medicine

## 2021-10-23 ENCOUNTER — Emergency Department: Payer: Medicaid Other

## 2021-10-23 DIAGNOSIS — O209 Hemorrhage in early pregnancy, unspecified: Secondary | ICD-10-CM | POA: Diagnosis present

## 2021-10-23 DIAGNOSIS — Z3A01 Less than 8 weeks gestation of pregnancy: Secondary | ICD-10-CM | POA: Diagnosis not present

## 2021-10-23 DIAGNOSIS — O200 Threatened abortion: Secondary | ICD-10-CM | POA: Diagnosis not present

## 2021-10-23 DIAGNOSIS — Z6740 Type O blood, Rh positive: Secondary | ICD-10-CM | POA: Insufficient documentation

## 2021-10-23 DIAGNOSIS — O99511 Diseases of the respiratory system complicating pregnancy, first trimester: Secondary | ICD-10-CM | POA: Diagnosis not present

## 2021-10-23 DIAGNOSIS — J45909 Unspecified asthma, uncomplicated: Secondary | ICD-10-CM | POA: Diagnosis not present

## 2021-10-23 LAB — CBC WITH DIFFERENTIAL/PLATELET
Abs Immature Granulocytes: 0.01 10*3/uL (ref 0.00–0.07)
Basophils Absolute: 0.1 10*3/uL (ref 0.0–0.1)
Basophils Relative: 1 %
Eosinophils Absolute: 0.2 10*3/uL (ref 0.0–0.5)
Eosinophils Relative: 3 %
HCT: 36.2 % (ref 36.0–46.0)
Hemoglobin: 11.8 g/dL — ABNORMAL LOW (ref 12.0–15.0)
Immature Granulocytes: 0 %
Lymphocytes Relative: 35 %
Lymphs Abs: 2.2 10*3/uL (ref 0.7–4.0)
MCH: 29.7 pg (ref 26.0–34.0)
MCHC: 32.6 g/dL (ref 30.0–36.0)
MCV: 91.2 fL (ref 80.0–100.0)
Monocytes Absolute: 0.7 10*3/uL (ref 0.1–1.0)
Monocytes Relative: 11 %
Neutro Abs: 3.3 10*3/uL (ref 1.7–7.7)
Neutrophils Relative %: 50 %
Platelets: 250 10*3/uL (ref 150–400)
RBC: 3.97 MIL/uL (ref 3.87–5.11)
RDW: 12.1 % (ref 11.5–15.5)
WBC: 6.4 10*3/uL (ref 4.0–10.5)
nRBC: 0 % (ref 0.0–0.2)

## 2021-10-23 LAB — COMPREHENSIVE METABOLIC PANEL
ALT: 11 U/L (ref 0–44)
AST: 21 U/L (ref 15–41)
Albumin: 4.2 g/dL (ref 3.5–5.0)
Alkaline Phosphatase: 51 U/L (ref 38–126)
Anion gap: 8 (ref 5–15)
BUN: 6 mg/dL (ref 6–20)
CO2: 21 mmol/L — ABNORMAL LOW (ref 22–32)
Calcium: 9 mg/dL (ref 8.9–10.3)
Chloride: 107 mmol/L (ref 98–111)
Creatinine, Ser: 0.78 mg/dL (ref 0.44–1.00)
GFR, Estimated: >60 mL/min (ref >60)
Glucose, Bld: 110 mg/dL — ABNORMAL HIGH (ref 70–99)
Potassium: 3.4 mmol/L — ABNORMAL LOW (ref 3.5–5.1)
Sodium: 136 mmol/L (ref 135–145)
Total Bilirubin: 0.7 mg/dL (ref 0.3–1.2)
Total Protein: 7.9 g/dL (ref 6.5–8.1)

## 2021-10-23 LAB — HCG, QUANTITATIVE, PREGNANCY: hCG, Beta Chain, Quant, S: 2725 m[IU]/mL — ABNORMAL HIGH (ref <5)

## 2021-10-23 NOTE — ED Triage Notes (Signed)
Pt presents to ED with c/o of vaginal bleeding, pt states she is about [redacted] weeks pregnant. Pt states she is not having to wear a pad or tampon.

## 2021-10-23 NOTE — Discharge Instructions (Signed)
Please follow-up with your OB on Tuesday as scheduled.  Your pregnancy hormone level today is 2725.  This will likely need to be rechecked on Tuesday.  Your ultrasound shows a 5-week and 6-day pregnancy with a heart rate of 120.  Return to the emergency department for any significant increase in bleeding any abdominal pain or any other symptom personally concerning to yourself.

## 2021-10-23 NOTE — ED Notes (Signed)
First Nurse Note: Pt to ED via POV. Pt states that she is approximately [redacted] weeks pregnant. She was fixing to get in the shower and noticed that she was bleeding. Pt is in NAD.

## 2021-10-23 NOTE — ED Provider Notes (Signed)
   [facility] Provider Note    Event Date/Time   First MD Initiated Contact with Patient 10/23/21 1947     (approximate)  History   Chief Complaint: Vaginal Bleeding  HPI  Sarah Reese is a 36 y.o. female G4, P2 A1 with a past medical history of anemia, asthma, presents to the emergency department for vaginal bleeding.  Patient states she is approximately 5 to [redacted] weeks pregnant.  Patient states just an hour or 2 before arrival she noted a small amount of spotting when she wiped after using the bathroom so she came to the emergency department.  Patient denies any vaginal bleeding.  Record review shows an O+ blood type.  Physical Exam   Triage Vital Signs: ED Triage Vitals  Enc Vitals Group     BP 10/23/21 1824 117/74     Pulse Rate 10/23/21 1824 90     Resp 10/23/21 1824 17     Temp 10/23/21 1824 98.7 F (37.1 C)     Temp Source 10/23/21 1824 Oral     SpO2 10/23/21 1824 100 %     Weight 10/23/21 1825 160 lb (72.6 kg)     Height 10/23/21 1825 '5\' 5"'$  (1.651 m)     Head Circumference --      Peak Flow --      Pain Score 10/23/21 1825 0     Pain Loc --      Pain Edu? --      Excl. in [place]? --     Most recent vital signs: Vitals:   10/23/21 1824  BP: 117/74  Pulse: 90  Resp: 17  Temp: 98.7 F (37.1 C)  SpO2: 100%    General: Awake, no distress.  CV:  Good peripheral perfusion.  Regular rate and rhythm  Resp:  Normal effort.  Equal breath sounds bilaterally.  Abd:  No distention.  Soft, nontender.  No rebound or guarding.   ED Results / Procedures / Treatments    RADIOLOGY  Ultrasound shows a 5-week 6-day intrauterine pregnancy with a heart rate of 120.   MEDICATIONS ORDERED IN ED: Medications - No data to display   IMPRESSION / MDM / [place] / ED COURSE  I reviewed the triage vital signs and the nursing notes.  Patient's presentation is most consistent with acute presentation with potential threat to life or  bodily function.  Patient presents emergency department for vaginal spotting approximate [redacted] weeks pregnant.  Patient ultrasound shows a 5-week 6-day pregnancy at 120 bpm.  Patient's lab work is nonrevealing a normal CBC normal chemistry.  Record review shows O+ blood type no need for RhoGAM.  Patient has a benign abdomen reassuring vitals.  Given the patient's reassuring work-up I believe the patient is safe for discharge home.  She has an appoint with OB on Tuesday.  Patient will follow-up on Tuesday for recheck of her beta hCG.  Patient agreeable to plan of care.  Discussed return precautions.  Beta hCG today is 2725  FINAL CLINICAL IMPRESSION(S) / ED DIAGNOSES   Threatened miscarriage   Note:  This document was prepared using Dragon voice recognition software and may include unintentional dictation errors.   Harvest Dark, MD 10/23/21 2243

## 2021-10-23 NOTE — ED Notes (Signed)
Pt A&O, pt given discharge instructions, pt ambulating with steady gait. 

## 2021-10-23 NOTE — ED Notes (Signed)
Pt asked this RN how long the wait is and she was told there was definite answer and we would get her back ASAP and pt states "you better call security then because Im not waiting out here".   Security called

## 2021-10-24 ENCOUNTER — Inpatient Hospital Stay (HOSPITAL_COMMUNITY): Payer: Medicaid Other

## 2021-10-24 ENCOUNTER — Inpatient Hospital Stay (HOSPITAL_COMMUNITY)
Admission: AD | Admit: 2021-10-24 | Discharge: 2021-10-24 | Disposition: A | Discharge status: Home / Self Care | Payer: Medicaid Other | Attending: Obstetrics & Gynecology | Admitting: Obstetrics & Gynecology

## 2021-10-24 ENCOUNTER — Emergency Department
Admission: EM | Admit: 2021-10-24 | Discharge: 2021-10-24 | Discharge status: Against Medical Advice | Payer: Medicaid Other

## 2021-10-24 ENCOUNTER — Encounter (HOSPITAL_COMMUNITY): Payer: Self-pay | Admitting: Obstetrics & Gynecology

## 2021-10-24 DIAGNOSIS — O468X1 Other antepartum hemorrhage, first trimester: Secondary | ICD-10-CM | POA: Insufficient documentation

## 2021-10-24 DIAGNOSIS — Z3A01 Less than 8 weeks gestation of pregnancy: Secondary | ICD-10-CM | POA: Insufficient documentation

## 2021-10-24 DIAGNOSIS — Z6790 Unspecified blood type, Rh positive: Secondary | ICD-10-CM

## 2021-10-24 DIAGNOSIS — O209 Hemorrhage in early pregnancy, unspecified: Secondary | ICD-10-CM

## 2021-10-24 LAB — URINALYSIS, ROUTINE W REFLEX MICROSCOPIC
Bilirubin Urine: NEGATIVE
Glucose, UA: NEGATIVE mg/dL
Ketones, ur: NEGATIVE mg/dL
Leukocytes,Ua: NEGATIVE
Nitrite: NEGATIVE
Protein, ur: NEGATIVE mg/dL
Specific Gravity, Urine: 1.008 (ref 1.005–1.030)
pH: 6 (ref 5.0–8.0)

## 2021-10-24 LAB — WET PREP, GENITAL
Clue Cells Wet Prep HPF POC: NONE SEEN
Sperm: NONE SEEN
Trich, Wet Prep: NONE SEEN
WBC, Wet Prep HPF POC: >=10 — AB (ref <10)
Yeast Wet Prep HPF POC: NONE SEEN

## 2021-10-24 NOTE — Discharge Instructions (Signed)

## 2021-10-24 NOTE — MAU Note (Signed)
Pt reports to mau with c/o lower abd pain that started yesterday and vaginal bleeding that started yesterday.  States she was evaluated at [facility] yesterday.  Reports baby had a HR on [place].

## 2021-10-24 NOTE — ED Notes (Signed)
After checking in pt states she is going to see if another hospital has a shorter wait time and left

## 2021-10-24 NOTE — MAU Provider Note (Signed)
History     CSN: [id number]  Arrival date and time: 10/24/21 1357   Event Date/Time   First Provider Initiated Contact with Patient 10/24/21 1448      Chief Complaint  Patient presents with   Abdominal Pain   Vaginal Bleeding   Ms. Noemie Zya Finkle is a 36 y.o. [id number] at 68w5dwho presents to MAU for vaginal bleeding which began yesterday. Patient states she had some bright red spotting after wiping that turned brown yesterday and then bright red spotting again today followed by brown spotting in MAU. Originally patient reported unchanged bleeding, but during stay in MAU passed a larger amount of bleeding in the toilet, seen by provider. Patient also reports cramping.  Pt denies vaginal discharge/odor/itching. Pt denies N/V, abdominal pain, constipation, diarrhea, or urinary problems. Pt denies fever, chills, fatigue, sweating or changes in appetite. Pt denies SOB or chest pain. Pt denies dizziness, HA, light-headedness, weakness.    OB History     Gravida  4   Para  2   Term  2   Preterm  0   AB  1   Living  2      SAB  1   IAB  0   Ectopic  0   Multiple  0   Live Births  2           Past Medical History:  Diagnosis Date   Anemia    Asthma    Heart murmur    since pregnancy   Scoliosis     Past Surgical History:  Procedure Laterality Date   DILATION AND CURETTAGE OF UTERUS     WISDOM TOOTH EXTRACTION      Family History  Problem Relation Age of Onset   Hypertension Mother    Hypertension Maternal Grandmother     Social History   Tobacco Use   Smoking status: Never   Smokeless tobacco: Never  Vaping Use   Vaping Use: Former  Substance Use Topics   Alcohol use: No   Drug use: No    Allergies: No Known Allergies  No medications prior to admission.    Review of Systems  Constitutional:  Negative for chills, diaphoresis, fatigue and fever.  Eyes:  Negative for visual disturbance.  Respiratory:  Negative for shortness  of breath.   Cardiovascular:  Negative for chest pain.  Gastrointestinal:  Negative for abdominal pain, constipation, diarrhea, nausea and vomiting.  Genitourinary:  Positive for pelvic pain and vaginal bleeding. Negative for dysuria, flank pain, frequency, urgency and vaginal discharge.  Neurological:  Negative for dizziness, weakness, light-headedness and headaches.   Physical Exam   Blood pressure 111/77, pulse 80, temperature 98.6 F (37 C), temperature source Oral, resp. rate 16, height '5\' 5"'$  (1.651 m), weight 71.7 kg, last menstrual period 09/07/2021, SpO2 99 %.  Patient Vitals for the past 24 hrs:  BP Temp Temp src Pulse Resp SpO2 Height Weight  10/24/21 1627 111/77 98.6 F (37 C) Oral 80 16 -- -- --  10/24/21 1444 101/70 -- -- 86 18 -- -- --  10/24/21 1423 123/64 98.1 F (36.7 C) Oral 90 17 99 % '5\' 5"'$  (1.651 m) 71.7 kg   Physical Exam Exam conducted with a chaperone present.  Constitutional:      General: She is not in acute distress.    Appearance: She is well-developed. She is not diaphoretic.  HENT:     Head: Normocephalic and atraumatic.  Pulmonary:     Effort: Pulmonary effort  is normal.  Abdominal:     Palpations: Abdomen is soft.  Genitourinary:    General: Normal vulva.     Labia:        Right: No rash, tenderness or lesion.        Left: No rash, tenderness or lesion.      Vagina: Bleeding present.     Cervix: Cervical bleeding present.     Comments: Moderate amount of bleeding with small clots x2 present in vagina. Minimal active bleeding from cervical os. Blood easily cleared with two swabs. Cervix visually closed. Skin:    General: Skin is warm and dry.  Neurological:     Mental Status: She is alert and oriented to person, place, and time.  Psychiatric:        Behavior: Behavior normal.        Thought Content: Thought content normal.        Judgment: Judgment normal.    Results for orders placed or performed during the hospital encounter of 10/24/21  (from the past 24 hour(s))  Urinalysis, Routine w reflex microscopic Urine, Clean Catch     Status: Abnormal   Collection Time: 10/24/21  2:20 PM  Result Value Ref Range   Color, Urine YELLOW YELLOW   APPearance CLEAR CLEAR   Specific Gravity, Urine 1.008 1.005 - 1.030   pH 6.0 5.0 - 8.0   Glucose, UA NEGATIVE NEGATIVE mg/dL   Hgb urine dipstick MODERATE (A) NEGATIVE   Bilirubin Urine NEGATIVE NEGATIVE   Ketones, ur NEGATIVE NEGATIVE mg/dL   Protein, ur NEGATIVE NEGATIVE mg/dL   Nitrite NEGATIVE NEGATIVE   Leukocytes,Ua NEGATIVE NEGATIVE   RBC / HPF 0-5 0 - 5 RBC/hpf   WBC, UA 0-5 0 - 5 WBC/hpf   Bacteria, UA RARE (A) NONE SEEN   Squamous Epithelial / LPF 0-5 0 - 5   Mucus PRESENT   Wet prep, genital     Status: Abnormal   Collection Time: 10/24/21  3:14 PM   Specimen: Vaginal  Result Value Ref Range   Yeast Wet Prep HPF POC NONE SEEN NONE SEEN   Trich, Wet Prep NONE SEEN NONE SEEN   Clue Cells Wet Prep HPF POC NONE SEEN NONE SEEN   WBC, Wet Prep HPF POC >=10 (A) <10   Sperm NONE SEEN    US OB Comp Less 14 Wks  Result Date: 10/24/2021 CLINICAL DATA:  Positive pregnancy test with vaginal bleeding. EXAM: OBSTETRIC <14 WK [place] AND TRANSVAGINAL OB US TECHNIQUE: Both transabdominal and transvaginal ultrasound examinations were performed for complete evaluation of the gestation as well as the maternal uterus, adnexal regions, and pelvic cul-de-sac. Transvaginal technique was performed to assess early pregnancy. COMPARISON:  10/23/2021. FINDINGS: Intrauterine gestational sac: Single Yolk sac:  Visualized Embryo:  Visualized Cardiac Activity: Visualized Heart Rate: 113 bpm MSD:   mm    w     d CRL:  6.3 mm   6 w   3 d                  [place] EDC: 06/16/2022 Subchorionic hemorrhage: Interval development of moderate to large subchorionic hemorrhage. Maternal uterus/adnexae: Unremarkable. IMPRESSION: 1. Single living intrauterine gestation at estimated 6 week 3 day gestational age by crown-rump length.  2. Interval development of moderate to large subchorionic hemorrhage. Electronically Signed   By: Misty Stanley M.D.   On: 10/24/2021 15:52   US OB LESS THAN 14 WEEKS WITH OB TRANSVAGINAL  Result Date: 10/23/2021 CLINICAL DATA:  Vaginal bleeding, pregnant, LMP 09/07/2021 EXAM: OBSTETRIC <14 WK [place] AND TRANSVAGINAL OB US TECHNIQUE: Both transabdominal and transvaginal ultrasound examinations were performed for complete evaluation of the gestation as well as the maternal uterus, adnexal regions, and pelvic cul-de-sac. Transvaginal technique was performed to assess early pregnancy. COMPARISON:  None Available. FINDINGS: Intrauterine gestational sac: Present, single Yolk sac:  Present, single, normal appearing Embryo:  Present Cardiac Activity: Present, regular Heart Rate: 120 bpm MSD: 8 mm CRL:  3 mm   5 w   6 d                  [place] EDC: 06/19/2022 Subchorionic hemorrhage:  None visualized. Maternal uterus/adnexae: The uterus is anteverted. The cervix is closed and is unremarkable. No intrauterine masses are seen. No free fluid within the cul-de-sac. The maternal ovaries are unremarkable. IMPRESSION: Single living intrauterine gestation with an estimated gestational age of [redacted] weeks, 6 days. Borderline small gestational sac size. Electronically Signed   By: Fidela Salisbury M.D.   On: 10/23/2021 22:09    MAU Course  Procedures  MDM -VB in pregnancy -RH positive -UA: mod hgb/rare bacteria, sending urine for culture -[place]: +FHR, +SCH -WetPrep: WNL -GC/CT collected -pt discharged to home in stable condition  Orders Placed This Encounter  Procedures   Culture, OB Urine    Standing Status:   Standing    Number of Occurrences:   1   Wet prep, genital    Standing Status:   Standing    Number of Occurrences:   1   US OB Comp Less 14 Wks    Standing Status:   Standing    Number of Occurrences:   1    Order Specific Question:   Symptom/Reason for Exam    Answer:   Vaginal bleeding in pregnancy [[id number]]    Urinalysis, Routine w reflex microscopic Urine, Clean Catch    Standing Status:   Standing    Number of Occurrences:   1   Discharge patient    Order Specific Question:   Discharge disposition    Answer:   01-Home or Self Care [1]    Order Specific Question:   Discharge patient date    Answer:   10/24/2021   No orders of the defined types were placed in this encounter.  Assessment and Plan   1. Vaginal bleeding in pregnancy, first trimester   2. Blood type, Rh positive   3. Subchorionic hematoma in first trimester, single or unspecified fetus     Allergies as of 10/24/2021   No Known Allergies      Medication List     TAKE these medications    albuterol 108 (90 Base) MCG/ACT inhaler Commonly known as: VENTOLIN HFA Inhale 2 puffs into the lungs every 6 (six) hours as needed for wheezing or shortness of breath.       -discussed expected s/sx of [facility] -work note given with lifting restrictions -discussed pelvic rest -return MAU precautions given -pt discharged to home in stable condition  Elmyra Ricks E Franchelle Foskett 10/24/2021, 5:03 PM

## 2021-10-26 ENCOUNTER — Other Ambulatory Visit: Payer: Self-pay | Admitting: Obstetrics and Gynecology

## 2021-10-26 ENCOUNTER — Ambulatory Visit (INDEPENDENT_AMBULATORY_CARE_PROVIDER_SITE_OTHER): Payer: Medicaid Other

## 2021-10-26 DIAGNOSIS — O3680X0 Pregnancy with inconclusive fetal viability, not applicable or unspecified: Secondary | ICD-10-CM | POA: Diagnosis not present

## 2021-10-26 DIAGNOSIS — O039 Complete or unspecified spontaneous abortion without complication: Secondary | ICD-10-CM

## 2021-10-26 DIAGNOSIS — O200 Threatened abortion: Secondary | ICD-10-CM

## 2021-10-26 DIAGNOSIS — O418X10 Other specified disorders of amniotic fluid and membranes, first trimester, not applicable or unspecified: Secondary | ICD-10-CM | POA: Diagnosis not present

## 2021-10-26 DIAGNOSIS — O468X1 Other antepartum hemorrhage, first trimester: Secondary | ICD-10-CM | POA: Diagnosis not present

## 2021-10-26 LAB — CULTURE, OB URINE

## 2021-10-26 LAB — GC/CHLAMYDIA PROBE AMP (~~LOC~~) NOT AT ARMC
Chlamydia: NEGATIVE
Comment: NEGATIVE
Comment: NORMAL
Neisseria Gonorrhea: NEGATIVE

## 2021-10-26 NOTE — Progress Notes (Addendum)
   GYNECOLOGY OFFICE VISIT NOTE  History:   Sarah Reese is a 36 y.o. [phone] here today for ultrasound results.  Ultrasound shows complete miscarriage.  Patient was seen on 9/3 at [facility] with a live IUP and on 9/4 in MAU with a live IUP but with new large subchorionic hemorrhage. She reports she has been bleeding heavy like a period since yesterday.   Abdominal pain: Yes , cramping that she rates a 4/10 Vaginal bleeding: Yes , like a period  OB History  Gravida Para Term Preterm AB Living  '4 2 2 '$ 0 1 2  SAB IAB Ectopic Multiple Live Births  1 0 0 0 2    # Outcome Date GA Lbr Len/2nd Weight Sex Delivery Anes PTL Lv  4 Current           3 Term 06/19/16 61w1d08:58 / 00:02 8 lb 9.8 oz (3.906 kg) M Vag-Spont None  LIV     Birth Comments: facial bruising  2 Term 09/22/09 458w0d  Vag-Spont   LIV  1 SAB              Health Maintenance Due  Topic Date Due   Hepatitis C Screening  Never done   TETANUS/TDAP  Never done   PAP SMEAR-Modifier  12/02/2018   COVID-19 Vaccine (3 - Moderna series) 04/16/2020   INFLUENZA VACCINE  Never done    Past Medical History:  Diagnosis Date   Anemia    Asthma    Heart murmur    since pregnancy   Scoliosis     Past Surgical History:  Procedure Laterality Date   DILATION AND CURETTAGE OF UTERUS     WISDOM TOOTH EXTRACTION      The following portions of the patient's history were reviewed and updated as appropriate: allergies, current medications, past family history, past medical history, past social history, past surgical history and problem list.   Review of Systems:  Pertinent items noted in HPI and remainder of comprehensive ROS otherwise negative.  Physical Exam:  LMP 09/07/2021 -Declined vitals CONSTITUTIONAL: Well-developed, well-nourished female in no acute distress.  HEENT:  Normocephalic, atraumatic. External right and left ear normal. No scleral icterus.  NECK: Normal range of motion, supple, no masses noted on  observation SKIN: No rash noted. Not diaphoretic. No erythema. No pallor. MUSCULOSKELETAL: Normal range of motion. No edema noted. NEUROLOGIC: Alert and oriented to person, place, and time. Normal muscle tone coordination.  PSYCHIATRIC: Normal mood and affect. Normal behavior. Normal judgment and thought content. RESPIRATORY: Effort normal, no problems with respiration noted  Assessment and Plan:   1. Complete miscarriage   2. Encounter to determine fetal viability of pregnancy, single or unspecified fetus    Condolences provided and space given for patient to process emotions. CNM discussed with patient no intervention needed at this time given no pregnancy or tissue remaining in the uterus.  Patient understandably upset. Reviewed that most early SAB are related to genetics and nothing patient did to cause or could prevent. Follow up reviewed and patient and partner agreeable to plan of care.   Please refer to After Visit Summary for other counseling recommendations.   Patient to follow up in 2 weeks for SAB follow up  Total face-to-face time with patient: 20 minutes.  Over 50% of encounter was spent on counseling and coordination of care.   CaWende MottCNPerryor WoDe[employer] CompanyCoPlumas [place]

## 2021-11-03 ENCOUNTER — Ambulatory Visit: Payer: Medicaid Other

## 2021-11-10 NOTE — Progress Notes (Signed)
  History:  [name] is a 36 y.o. [phone] who presents to clinic today for follow up from miscarriage. She would like to try to get pregnant again. She states that her stress level caused her to have her miscarriage and that she is going to work on her stress. She states that she feels fine physically but still feels sad inside. She does not want to start medicine or see IBH. She states that the only thing that will make her happy is having another baby. She reports that she is ovulating and she is going to try to get pregnant today or tomorrow. She has not gotten her period back. She has many questions about how her miscarriage happened (live IUP one day and then miscarriage the next).   The following portions of the patient's history were reviewed and updated as appropriate: allergies, current medications, family history, past medical history, social history, past surgical history and problem list.  Review of Systems:  Review of Systems  Constitutional: Negative.   HENT: Negative.    Respiratory: Negative.    Genitourinary: Negative.   Musculoskeletal: Negative.   Skin: Negative.   Neurological: Negative.   Psychiatric/Behavioral: Negative.        Objective:  Physical Exam LMP 09/07/2021  Physical Exam Constitutional:      Appearance: Normal appearance.  Cardiovascular:     Pulses: Normal pulses.  Pulmonary:     Effort: Pulmonary effort is normal.  Abdominal:     General: Abdomen is flat.  Neurological:     General: No focal deficit present.     Mental Status: She is alert.  Psychiatric:        Mood and Affect: Mood normal.       Labs and Imaging No results found for this or any previous visit (from the past 24 hour(s)).  No results found.  Health Maintenance Due  Topic Date Due   Hepatitis C Screening  Never done   TETANUS/TDAP  Never done   PAP SMEAR-Modifier  12/02/2018   COVID-19 Vaccine (3 - Moderna series) 04/16/2020   INFLUENZA VACCINE   Never done    Labs, imaging and previous visits in Epic and Care Everywhere reviewed  Assessment & Plan:   1. Miscarriage   -advised patient to avoid pregnancy until she has had her cycle return, discussed healthy life style and stress reduction -reviewed MAU visits and explained how fetuses grow and how [facility] can be sign of miscarriage or a risk of miscarriage, patient appreciated the explanations.  Approximately 20 minutes of total time was spent with this patient on care and coordination.   Sarah Reese, [place] 11/10/2021 9:07 AM

## 2021-11-11 ENCOUNTER — Encounter: Payer: Self-pay | Admitting: Student

## 2021-11-11 ENCOUNTER — Ambulatory Visit (INDEPENDENT_AMBULATORY_CARE_PROVIDER_SITE_OTHER): Payer: Medicaid Other | Admitting: Student

## 2021-11-11 VITALS — BP 120/73 | Ht 65.0 in | Wt 159.6 lb

## 2021-11-11 DIAGNOSIS — O039 Complete or unspecified spontaneous abortion without complication: Secondary | ICD-10-CM

## 2021-11-11 MED ORDER — ASPIRIN 81 MG PO TBEC: 81.0000 mg | DELAYED_RELEASE_TABLET | Freq: Every day | ORAL | 12 refills | Status: DC

## 2021-11-16 ENCOUNTER — Telehealth: Payer: Self-pay | Admitting: Clinical

## 2021-11-16 ENCOUNTER — Other Ambulatory Visit: Payer: Self-pay

## 2021-11-16 NOTE — Telephone Encounter (Signed)
Attempt call regarding referral; Unable to leave voice mail as voicemail is full.

## 2021-11-17 ENCOUNTER — Encounter: Payer: Self-pay | Admitting: Advanced Practice Midwife

## 2021-12-15 ENCOUNTER — Telehealth: Payer: Self-pay | Admitting: Clinical

## 2021-12-15 NOTE — Telephone Encounter (Signed)
Attempt call regarding referral; Unable to leave message as mailbox is full.

## 2022-01-11 ENCOUNTER — Encounter (HOSPITAL_COMMUNITY): Payer: Self-pay

## 2022-01-11 ENCOUNTER — Other Ambulatory Visit: Payer: Self-pay

## 2022-01-11 ENCOUNTER — Ambulatory Visit: Payer: Self-pay | Admitting: *Deleted

## 2022-01-11 ENCOUNTER — Inpatient Hospital Stay (HOSPITAL_COMMUNITY)
Admission: AD | Admit: 2022-01-11 | Discharge: 2022-01-11 | Disposition: A | Discharge status: Home / Self Care | Payer: Medicaid Other | Attending: Obstetrics and Gynecology | Admitting: Obstetrics and Gynecology

## 2022-01-11 ENCOUNTER — Telehealth: Payer: Self-pay | Admitting: Family Medicine

## 2022-01-11 DIAGNOSIS — Z3A01 Less than 8 weeks gestation of pregnancy: Secondary | ICD-10-CM | POA: Diagnosis not present

## 2022-01-11 DIAGNOSIS — O09521 Supervision of elderly multigravida, first trimester: Secondary | ICD-10-CM | POA: Insufficient documentation

## 2022-01-11 DIAGNOSIS — Z711 Person with feared health complaint in whom no diagnosis is made: Secondary | ICD-10-CM | POA: Insufficient documentation

## 2022-01-11 DIAGNOSIS — O26891 Other specified pregnancy related conditions, first trimester: Secondary | ICD-10-CM | POA: Diagnosis present

## 2022-01-11 DIAGNOSIS — Z3201 Encounter for pregnancy test, result positive: Secondary | ICD-10-CM | POA: Diagnosis not present

## 2022-01-11 DIAGNOSIS — R102 Pelvic and perineal pain: Secondary | ICD-10-CM | POA: Insufficient documentation

## 2022-01-11 DIAGNOSIS — N96 Recurrent pregnancy loss: Secondary | ICD-10-CM | POA: Diagnosis not present

## 2022-01-11 DIAGNOSIS — R8271 Bacteriuria: Secondary | ICD-10-CM

## 2022-01-11 DIAGNOSIS — Z8759 Personal history of other complications of pregnancy, childbirth and the puerperium: Secondary | ICD-10-CM | POA: Diagnosis not present

## 2022-01-11 LAB — URINALYSIS, ROUTINE W REFLEX MICROSCOPIC
Bilirubin Urine: NEGATIVE
Glucose, UA: NEGATIVE mg/dL
Hgb urine dipstick: NEGATIVE
Ketones, ur: NEGATIVE mg/dL
Nitrite: NEGATIVE
Protein, ur: NEGATIVE mg/dL
Specific Gravity, Urine: 1.02 (ref 1.005–1.030)
pH: 6 (ref 5.0–8.0)

## 2022-01-11 LAB — COMPREHENSIVE METABOLIC PANEL
ALT: 16 U/L (ref 0–44)
AST: 32 U/L (ref 15–41)
Albumin: 4.1 g/dL (ref 3.5–5.0)
Alkaline Phosphatase: 46 U/L (ref 38–126)
Anion gap: 10 (ref 5–15)
BUN: 5 mg/dL — ABNORMAL LOW (ref 6–20)
CO2: 25 mmol/L (ref 22–32)
Calcium: 9.6 mg/dL (ref 8.9–10.3)
Chloride: 102 mmol/L (ref 98–111)
Creatinine, Ser: 0.86 mg/dL (ref 0.44–1.00)
GFR, Estimated: >60 mL/min (ref >60)
Glucose, Bld: 95 mg/dL (ref 70–99)
Potassium: 3.6 mmol/L (ref 3.5–5.1)
Sodium: 137 mmol/L (ref 135–145)
Total Bilirubin: 0.9 mg/dL (ref 0.3–1.2)
Total Protein: 7.9 g/dL (ref 6.5–8.1)

## 2022-01-11 LAB — CBC
HCT: 36.5 % (ref 36.0–46.0)
Hemoglobin: 12.2 g/dL (ref 12.0–15.0)
MCH: 29.7 pg (ref 26.0–34.0)
MCHC: 33.4 g/dL (ref 30.0–36.0)
MCV: 88.8 fL (ref 80.0–100.0)
Platelets: 257 10*3/uL (ref 150–400)
RBC: 4.11 MIL/uL (ref 3.87–5.11)
RDW: 12.5 % (ref 11.5–15.5)
WBC: 3.9 10*3/uL — ABNORMAL LOW (ref 4.0–10.5)
nRBC: 0 % (ref 0.0–0.2)

## 2022-01-11 LAB — WET PREP, GENITAL
Sperm: NONE SEEN
Trich, Wet Prep: NONE SEEN
WBC, Wet Prep HPF POC: <10 (ref <10)
Yeast Wet Prep HPF POC: NONE SEEN

## 2022-01-11 LAB — HCG, QUANTITATIVE, PREGNANCY: hCG, Beta Chain, Quant, S: 146 m[IU]/mL — ABNORMAL HIGH (ref <5)

## 2022-01-11 LAB — POCT PREGNANCY, URINE: Preg Test, Ur: POSITIVE — AB

## 2022-01-11 MED ORDER — PREPLUS 27-1 MG PO TABS: 1.0000 | ORAL_TABLET | Freq: Every day | ORAL | 3 refills | Status: DC

## 2022-01-11 NOTE — MAU Provider Note (Signed)
History     CSN: [id number]  Arrival date and time: 01/11/22 1230   Event Date/Time   First Provider Initiated Contact with Patient 01/11/22 1320      No chief complaint on file.  Evangelina Jynesis Nakamura is a 36 y.o. [id number] at 76w4dby definite LMP of Dec 17, 2021.  She presents today for concerns and anxiety d/t history of miscarriage.  Patient reports she had a miscarriage in Sept 2023 and is worried that "everything is okay." Patient expresses a desire for conception.  She initially told triage nurse that she was experiencing cramping, but admits to provider that she currently has no cramping and her incident yesterday lasted only about one hour.  However, patient expresses knowledge in knowing that cramping, during early pregnancy, is anticipated.  She states the cramping is located in the vaginal area and "feels like I am about to start my period."  Patient with a recent diagnosis of BV, but has yet to take her medications. 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     OB History     Gravida  5   Para  2   Term  2   Preterm  0   AB  1   Living  2      SAB  1   IAB  0   Ectopic  0   Multiple  0   Live Births  2           Past Medical History:  Diagnosis Date   Anemia    Asthma    Heart murmur    since pregnancy   Scoliosis     Past Surgical History:  Procedure Laterality Date   DILATION AND CURETTAGE OF UTERUS     WISDOM TOOTH EXTRACTION      Family History  Problem Relation Age of Onset   Hypertension Mother    Hypertension Maternal Grandmother     Social History   Tobacco Use   Smoking status: Never   Smokeless tobacco: Never  Vaping Use   Vaping Use: Former  Substance Use Topics   Alcohol use: No   Drug use: No    Allergies: No Known Allergies  Medications Prior to Admission  Medication Sig Dispense Refill Last Dose   albuterol (VENTOLIN HFA) 108 (90 Base) MCG/ACT inhaler  Inhale 2 puffs into the lungs every 6 (six) hours as needed for wheezing or shortness of breath. 8 g 1     Review of Systems  Constitutional:  Negative for chills and fever.  Genitourinary:  Negative for difficulty urinating, vaginal bleeding and vaginal discharge.   Physical Exam   Blood pressure 111/74, pulse 82, temperature 98.8 F (37.1 C), temperature source Oral, resp. rate 15, height '5\' 5"'$  (1.651 m), weight 75.3 kg, last menstrual period 12/17/2021, SpO2 99 %, unknown if currently breastfeeding.  Physical Exam Vitals reviewed.  Constitutional:      Appearance: Normal appearance.  HENT:     Head: Normocephalic and atraumatic.  Eyes:     Conjunctiva/sclera: Conjunctivae normal.  Cardiovascular:     Rate and Rhythm: Normal rate.  Pulmonary:     Effort: Pulmonary effort is normal.  Abdominal:     General: Bowel sounds are normal.  Musculoskeletal:     Cervical back: Normal range of motion.  Neurological:     Mental Status: She is alert and oriented to person, place, and time.  Psychiatric:  Mood and Affect: Mood normal.        Behavior: Behavior normal.    MAU Course  Procedures Results for orders placed or performed during the hospital encounter of 01/11/22 (from the past 24 hour(s))  Pregnancy, urine POC     Status: Abnormal   Collection Time: 01/11/22 12:53 PM  Result Value Ref Range   Preg Test, Ur POSITIVE (A) NEGATIVE  Urinalysis, Routine w reflex microscopic Urine, Clean Catch     Status: Abnormal   Collection Time: 01/11/22  1:00 PM  Result Value Ref Range   Color, Urine YELLOW YELLOW   APPearance HAZY (A) CLEAR   Specific Gravity, Urine 1.020 1.005 - 1.030   pH 6.0 5.0 - 8.0   Glucose, UA NEGATIVE NEGATIVE mg/dL   Hgb urine dipstick NEGATIVE NEGATIVE   Bilirubin Urine NEGATIVE NEGATIVE   Ketones, ur NEGATIVE NEGATIVE mg/dL   Protein, ur NEGATIVE NEGATIVE mg/dL   Nitrite NEGATIVE NEGATIVE   Leukocytes,Ua TRACE (A) NEGATIVE   RBC / HPF 0-5 0 - 5  RBC/hpf   WBC, UA 0-5 0 - 5 WBC/hpf   Bacteria, UA MANY (A) NONE SEEN   Squamous Epithelial / LPF 6-10 0 - 5   Mucus PRESENT   Wet prep, genital     Status: Abnormal   Collection Time: 01/11/22  1:02 PM  Result Value Ref Range   Yeast Wet Prep HPF POC NONE SEEN NONE SEEN   Trich, Wet Prep NONE SEEN NONE SEEN   Clue Cells Wet Prep HPF POC PRESENT (A) NONE SEEN   WBC, Wet Prep HPF POC <10 <10   Sperm NONE SEEN   CBC     Status: Abnormal   Collection Time: 01/11/22  1:13 PM  Result Value Ref Range   WBC 3.9 (L) 4.0 - 10.5 K/uL   RBC 4.11 3.87 - 5.11 MIL/uL   Hemoglobin 12.2 12.0 - 15.0 g/dL   HCT 36.5 36.0 - 46.0 %   MCV 88.8 80.0 - 100.0 fL   MCH 29.7 26.0 - 34.0 pg   MCHC 33.4 30.0 - 36.0 g/dL   RDW 12.5 11.5 - 15.5 %   Platelets 257 150 - 400 K/uL   nRBC 0.0 0.0 - 0.2 %  hCG, quantitative, pregnancy     Status: Abnormal   Collection Time: 01/11/22  1:13 PM  Result Value Ref Range   hCG, Beta Chain, Quant, S 146 (H) <5 mIU/mL  Comprehensive metabolic panel     Status: Abnormal   Collection Time: 01/11/22  1:13 PM  Result Value Ref Range   Sodium 137 135 - 145 mmol/L   Potassium 3.6 3.5 - 5.1 mmol/L   Chloride 102 98 - 111 mmol/L   CO2 25 22 - 32 mmol/L   Glucose, Bld 95 70 - 99 mg/dL   BUN 5 (L) 6 - 20 mg/dL   Creatinine, Ser 0.86 0.44 - 1.00 mg/dL   Calcium 9.6 8.9 - 10.3 mg/dL   Total Protein 7.9 6.5 - 8.1 g/dL   Albumin 4.1 3.5 - 5.0 g/dL   AST 32 15 - 41 U/L   ALT 16 0 - 44 U/L   Alkaline Phosphatase 46 38 - 126 U/L   Total Bilirubin 0.9 0.3 - 1.2 mg/dL   GFR, Estimated >60 >60 mL/min   Anion gap 10 5 - 15    MDM Wet Prep and GC/CT Labs: UA, UPT, CBC, hCG Coordination of Follow Up Assessment and Plan  36 year old [id number]  at 3.4 weeks Positive UPT Physically Well, but Worried H/O Miscarriage  -Reviewed POC with patient. -Informed that in absence of pain, anticipate normal progression of pregnancy. -Reassured regarding round ligament pain and informed  that vaginal pain could be related to infection.  -However, when considering concern and recent loss, will perform work-up. -Labs ordered. -Swabs collected by patient. -Discussed ultrasound depending upon hCG level. -Provider to call and discuss results and POC. -Patient agreeable. Leaves updated number [phone] for contact.  Maryann Conners 01/11/2022, 1:21 PM   Reassessment (3:55 PM) -Results as above. -Contacted patient, via phone, and informed of results and need for repeat hCG in 48-72 hrs. -Despite anxiety, patient agreeable with returning on Thursday Nov 23 after 1300 for repeat hCG. -Discussed considering factors for additional testing and if appropriate rise will plan for outpatient ultrasound. -Patient agreeable.  -UA with bacteria and order placed for culture.  Lab also contacted regarding order and will process. -Rx for PNV sent to pharmacy on file.  -Precautions reviewed.  Maryann Conners MSN, CNM Advanced Practice Provider, Center for [facility]  **This visit was completed, in its entirety, via telehealth communications.  I personally spent >/=5 minutes on the phone providing recommendations, education, and guidance.**

## 2022-01-11 NOTE — Telephone Encounter (Signed)
Patient is questioning  her test results, she said her pregnancy test from the hospital says abnormal, she is worried because she just had a miscarriage back in September of this year.

## 2022-01-11 NOTE — MAU Note (Addendum)
...  HKVQQVZ Sarah Reese is a 36 y.o. at Unknown here in MAU reporting: She reports with her hx of miscarriages she is here to ensure her hcg levels "are okay." She reports she is in no current pain but over the last couple of days she has been experiencing periodic cramping that when it comes it lasts for around an hour. Last felt this pain last night around 2200. She reports the pain when it comes is 6/10. Denies VB.   She reports she was dx with BV two weeks ago and never picked up her prescription. Denies vaginal itching but reports a slight odor.   +UPT in MAU. She reports she had her first positive this past Sunday. She had negative pregnancy tests in October and she reports she had a full period.   LMP: 12/17/2021 Onset of complaint: x2 days Pain score: Denies pain.  Lab orders placed from triage: POCT Pregnancy

## 2022-01-11 NOTE — Telephone Encounter (Signed)
I'm stressed out and I'm having anxiety.   My test for pregnancy is positive.   I'm on MyChart.   (Pt. Was short and curt and stating she got her test results on MyChart and wanted to know why they were saying "abnormal).  I got my results on MyChart that say my test is abnormal.   What does that mean?  She was very vague in her answers.  When I asked her where she was she said,   "I'm at home".   I finally was able to determine she went to the [facility] location.   No one talked with her about her test results when I asked her about having a doctor explain the results to her.  While I was looking in her chart to see if I could answer her many questions she hung up on me.       As I was looking in her chart she is in [facility] under the care of Dr. Mora Bellman in the [place] [facility].   (Not sure why she was calling an outside line).    She called into the Patient Engagement Center on our [employer]).   She hung up before I found all this out.       Reason for Disposition  [1] Caller requesting NON-URGENT health information AND [2] PCP's office is the best resource    Needs to speak with staff in the Maternity Assessment Unit where she is when this call was made.  Answer Assessment - Initial Assessment Questions 1. REASON FOR CALL or QUESTION: "What is your reason for calling today?" or "How can I best help you?" or "What question do you have that I can help answer?"     See documentation notes  Protocols used: Information Only Call - No Triage-A-AH

## 2022-01-12 ENCOUNTER — Encounter: Payer: Self-pay | Admitting: Lactation Services

## 2022-01-12 LAB — GC/CHLAMYDIA PROBE AMP (~~LOC~~) NOT AT ARMC
Chlamydia: NEGATIVE
Comment: NEGATIVE
Comment: NORMAL
Neisseria Gonorrhea: NEGATIVE

## 2022-01-12 LAB — URINE CULTURE: Culture: >=100000 — AB

## 2022-01-12 NOTE — Telephone Encounter (Signed)
Returned patients call. She is concerns + UPT shows as an abnormal result. Patient did not answer. LM that that testing would show abnormal when positive so no concerns. Asked her to call the office at [phone] with any further questions.

## 2022-01-13 ENCOUNTER — Inpatient Hospital Stay (HOSPITAL_COMMUNITY)
Admission: AD | Admit: 2022-01-13 | Discharge: 2022-01-13 | Disposition: A | Discharge status: Home / Self Care | Payer: Medicaid Other | Attending: Obstetrics and Gynecology | Admitting: Obstetrics and Gynecology

## 2022-01-13 ENCOUNTER — Inpatient Hospital Stay (HOSPITAL_COMMUNITY): Payer: Medicaid Other

## 2022-01-13 ENCOUNTER — Inpatient Hospital Stay (EMERGENCY_DEPARTMENT_HOSPITAL)
Admission: AD | Admit: 2022-01-13 | Discharge: 2022-01-13 | Disposition: A | Discharge status: Home / Self Care | Payer: Medicaid Other | Source: Home / Self Care | Attending: Obstetrics and Gynecology | Admitting: Obstetrics and Gynecology

## 2022-01-13 ENCOUNTER — Other Ambulatory Visit (HOSPITAL_COMMUNITY)
Admission: RE | Admit: 2022-01-13 | Discharge: 2022-01-13 | Disposition: A | Discharge status: Home / Self Care | Payer: Medicaid Other | Source: Ambulatory Visit | Attending: Obstetrics and Gynecology | Admitting: Obstetrics and Gynecology

## 2022-01-13 ENCOUNTER — Telehealth (HOSPITAL_BASED_OUTPATIENT_CLINIC_OR_DEPARTMENT_OTHER): Payer: Self-pay | Admitting: Advanced Practice Midwife

## 2022-01-13 ENCOUNTER — Other Ambulatory Visit: Payer: Self-pay

## 2022-01-13 DIAGNOSIS — O09521 Supervision of elderly multigravida, first trimester: Secondary | ICD-10-CM | POA: Diagnosis not present

## 2022-01-13 DIAGNOSIS — R109 Unspecified abdominal pain: Secondary | ICD-10-CM | POA: Insufficient documentation

## 2022-01-13 DIAGNOSIS — O26891 Other specified pregnancy related conditions, first trimester: Secondary | ICD-10-CM | POA: Insufficient documentation

## 2022-01-13 DIAGNOSIS — Z3A01 Less than 8 weeks gestation of pregnancy: Secondary | ICD-10-CM

## 2022-01-13 DIAGNOSIS — O26899 Other specified pregnancy related conditions, unspecified trimester: Secondary | ICD-10-CM

## 2022-01-13 LAB — HCG, QUANTITATIVE, PREGNANCY: hCG, Beta Chain, Quant, S: 459 m[IU]/mL — ABNORMAL HIGH (ref <5)

## 2022-01-13 NOTE — MAU Provider Note (Signed)
S: Pt returned to MAU for ultrasound and was triaged without change in symptoms. She continues to have mild irregular menstrual like cramping with no bleeding.    O: BP 112/60 (BP Location: Right Arm)   Pulse 84   Temp 98.4 F (36.9 C)   Resp 18   LMP 12/17/2021 (Exact Date)   VS reviewed, nursing note reviewed,  Constitutional: well developed, well nourished, no distress HEENT: normocephalic CV: normal rate Pulm/chest wall: normal effort Abdomen: soft Neuro: alert and oriented x 3 Skin: warm, dry Psych: affect normal   US OB LESS THAN 14 WEEKS WITH OB TRANSVAGINAL  Result Date: 01/13/2022 CLINICAL DATA:  Cramping. Intermittent. Beta HCG quantitative 459. Gestational age by last menstrual period 3 weeks and 6 days. Estimated due date by last menstrual period08/03/2022. Last menstrual period 06/10/2021. EXAM: OBSTETRIC <14 WK [place] AND TRANSVAGINAL OB US TECHNIQUE: Both transabdominal and transvaginal ultrasound examinations were performed for complete evaluation of the gestation as well as the maternal uterus, adnexal regions, and pelvic cul-de-sac. Transvaginal technique was performed to assess early pregnancy. COMPARISON:  None Available. FINDINGS: Intrauterine gestational sac: None Yolk sac:  Not Visualized. Embryo:  Not Visualized. Cardiac Activity: Not Visualized. Subchorionic hemorrhage:  None visualized. Maternal uterus/adnexae: Cystic lesion noted within the lower uterine myometrium. Bilateral ovaries are unremarkable. Corpus luteum cyst noted within the right ovary. IMPRESSION: A 1.1 x 1 x 1.6 cm cystic lesion noted within the lower uterine myometrium. Finding could represent an intrauterine ectopic Caesarean section pregnancy. Correlate with history of Caesarean section. If prior Caesarean section, recommend OBGYN consultation. Recommend follow-up quantitative B-HCG levels and follow-up US. Electronically Signed   By: Iven Finn M.D.   On: 01/13/2022 20:47     Medical  Management: Pt with mild irregular pain. HCG rose appropriately on labs drawn today.  [place] with cystic structure in lower uterine segment but pt without hx of cesarean delivery.  Consult Dr Elly Modena.  D/C home with outpatient [place] in 7-10 days.  A: 1. Abdominal pain during pregnancy in first trimester   2. [redacted] weeks gestation of pregnancy      P: D/C home with return precautions  Fatima Blank, CNM 9:28 PM

## 2022-01-13 NOTE — MAU Note (Signed)
.  DPBAQVO Sarah Reese is a 36 y.o. at 5w6dhere in MAU reporting: pt called to return for [place] previous visit today. Pt report some cramping ABD intermittently, currently no pain.    Pain score: 0/10 Vitals:   01/13/22 2009  BP: 112/60  Pulse: 84  Resp: 18  Temp: 98.4 F (36.9 C)      Lab orders placed from triage:

## 2022-01-13 NOTE — Telephone Encounter (Signed)
Error

## 2022-01-13 NOTE — MAU Provider Note (Signed)
S: 36 y.o. [id number] '@[redacted]w[redacted]d'$  by LMP presents to MAU for repeat hcg.  She reports intermittent menstrual like cramping, lasts about an hour each time it occurs and resolves with rest.  Her quant hcg on 01/11/22 was 146. She needed to leave MAU and [place] was not performed.  HPI  O: BP 102/63 (BP Location: Right Arm)   Pulse 78   Temp 98.3 F (36.8 C) (Oral)   Resp 18   Ht '5\' 5"'$  (1.651 m)   Wt 71.8 kg   LMP 12/17/2021 (Exact Date)   SpO2 100%   BMI 26.34 kg/m   VS reviewed, nursing note reviewed,  Constitutional: well developed, well nourished, no distress HEENT: normocephalic CV: normal rate Pulm/chest wall: normal effort Abdomen: soft Neuro: alert and oriented x 3 Skin: warm, dry Psych: affect normal  Results for orders placed or performed during the hospital encounter of 01/13/22 (from the past 24 hour(s))  hCG, quantitative, pregnancy     Status: Abnormal   Collection Time: 01/13/22  4:39 PM  Result Value Ref Range   hCG, Beta Chain, Quant, S 459 (H) <5 mIU/mL       MDM: Ordered labs/reviewed results.  Quant hcg rose appropriately  Pt needed to leave   Pt to return to MAU in 48 hours/follow up with ultrasound in 1 week.  Ectopic precautions given and pt to return to MAU sooner if s/sx of ectopic, as ruptured ectopic can be life threatening.  Pt stable at time of discharge.  A:  1. Abdominal pain affecting pregnancy   2. [redacted] weeks gestation of pregnancy     P: D/C home with ectopic/bleeding precautions Return today for [place] to complete evaluation Return to MAU as needed for emergencies  LEFTWICH-KIRBY, Brier Firebaugh, CNM 2:18 PM

## 2022-01-13 NOTE — Telephone Encounter (Signed)
Called pt to review hcg lab results.  Verified pt identity using 2 markers.  Reviewed hcg level of 459 as appropriate rise from 146 on 01/11/22.  Questions answered.  Pt on her way back to MAU for [place], as she had to leave earlier before ultrasound.

## 2022-01-13 NOTE — MAU Note (Signed)
VQMGQQP Sarah Reese is a 36 y.o. at 59w6dhere in MAU reporting: she is here for HCG level.  Denies VB and pain LMP: N/A Onset of complaint: 2 days ago Pain score: 0 Vitals:   01/13/22 1626  BP: 102/63  Pulse: 78  Resp: 18  Temp: 98.3 F (36.8 C)  SpO2: 100%     FHT:NA Lab orders placed from triage:   HCG Level

## 2022-01-21 ENCOUNTER — Inpatient Hospital Stay (EMERGENCY_DEPARTMENT_HOSPITAL)
Admission: AD | Admit: 2022-01-21 | Discharge: 2022-01-21 | Disposition: A | Discharge status: Home / Self Care | Payer: Medicaid Other | Source: Home / Self Care | Attending: Obstetrics and Gynecology | Admitting: Obstetrics and Gynecology

## 2022-01-21 ENCOUNTER — Inpatient Hospital Stay (HOSPITAL_COMMUNITY)
Admission: AD | Admit: 2022-01-21 | Discharge: 2022-01-21 | Discharge status: Against Medical Advice | Payer: Medicaid Other | Attending: Obstetrics and Gynecology | Admitting: Obstetrics and Gynecology

## 2022-01-21 ENCOUNTER — Inpatient Hospital Stay (HOSPITAL_COMMUNITY): Payer: Medicaid Other

## 2022-01-21 ENCOUNTER — Encounter (HOSPITAL_COMMUNITY): Payer: Self-pay | Admitting: Obstetrics and Gynecology

## 2022-01-21 DIAGNOSIS — O99611 Diseases of the digestive system complicating pregnancy, first trimester: Secondary | ICD-10-CM | POA: Insufficient documentation

## 2022-01-21 DIAGNOSIS — Z6740 Type O blood, Rh positive: Secondary | ICD-10-CM

## 2022-01-21 DIAGNOSIS — M419 Scoliosis, unspecified: Secondary | ICD-10-CM | POA: Insufficient documentation

## 2022-01-21 DIAGNOSIS — O26891 Other specified pregnancy related conditions, first trimester: Secondary | ICD-10-CM | POA: Insufficient documentation

## 2022-01-21 DIAGNOSIS — O09521 Supervision of elderly multigravida, first trimester: Secondary | ICD-10-CM | POA: Insufficient documentation

## 2022-01-21 DIAGNOSIS — K5900 Constipation, unspecified: Secondary | ICD-10-CM | POA: Insufficient documentation

## 2022-01-21 DIAGNOSIS — Z3A01 Less than 8 weeks gestation of pregnancy: Secondary | ICD-10-CM

## 2022-01-21 DIAGNOSIS — R102 Pelvic and perineal pain: Secondary | ICD-10-CM

## 2022-01-21 DIAGNOSIS — R109 Unspecified abdominal pain: Secondary | ICD-10-CM | POA: Insufficient documentation

## 2022-01-21 DIAGNOSIS — Z3490 Encounter for supervision of normal pregnancy, unspecified, unspecified trimester: Secondary | ICD-10-CM

## 2022-01-21 LAB — HCG, QUANTITATIVE, PREGNANCY: hCG, Beta Chain, Quant, S: 19099 m[IU]/mL — ABNORMAL HIGH (ref <5)

## 2022-01-21 LAB — CBC
HCT: 34 % — ABNORMAL LOW (ref 36.0–46.0)
Hemoglobin: 11.8 g/dL — ABNORMAL LOW (ref 12.0–15.0)
MCH: 30.5 pg (ref 26.0–34.0)
MCHC: 34.7 g/dL (ref 30.0–36.0)
MCV: 87.9 fL (ref 80.0–100.0)
Platelets: 241 10*3/uL (ref 150–400)
RBC: 3.87 MIL/uL (ref 3.87–5.11)
RDW: 13.2 % (ref 11.5–15.5)
WBC: 6.1 10*3/uL (ref 4.0–10.5)
nRBC: 0 % (ref 0.0–0.2)

## 2022-01-21 LAB — URINALYSIS, ROUTINE W REFLEX MICROSCOPIC
Bilirubin Urine: NEGATIVE
Glucose, UA: NEGATIVE mg/dL
Hgb urine dipstick: NEGATIVE
Ketones, ur: NEGATIVE mg/dL
Leukocytes,Ua: NEGATIVE
Nitrite: NEGATIVE
Protein, ur: NEGATIVE mg/dL
Specific Gravity, Urine: 1.021 (ref 1.005–1.030)
pH: 5 (ref 5.0–8.0)

## 2022-01-21 NOTE — MAU Provider Note (Addendum)
History     CSN: [id number]  Arrival date and time: 01/21/22 1831   None     Chief Complaint  Patient presents with   Abdominal Pain   Abdominal Pain This is a new problem. The current episode started in the past 7 days. The problem occurs intermittently. The problem has been unchanged. The pain is located in the suprapubic region. The quality of the pain is cramping. The abdominal pain does not radiate. Associated symptoms include constipation. Nothing aggravates the pain. The pain is relieved by Nothing. She has tried nothing for the symptoms.    Sarah Reese is a 36 y.o. [id number] presents with continued intermittent cramping described as 5/10 pain since last week. No vaginal bleeding/spotting, abdominal pain, hematuria, dysuria, diarrhea. Does endorse some constipation. Had [place] 11/23 which did not visualize yolk sac/fetal heart beat and cystic lesions were noted within the lower uterine myometrium. B hcg has been rising appropriately, last check 459.   OB History     Gravida  5   Para  2   Term  2   Preterm  0   AB  2   Living  2      SAB  2   IAB  0   Ectopic  0   Multiple  0   Live Births  2           Past Medical History:  Diagnosis Date   Anemia    Asthma    Heart murmur    since pregnancy   Scoliosis     Past Surgical History:  Procedure Laterality Date   DILATION AND CURETTAGE OF UTERUS     WISDOM TOOTH EXTRACTION      Family History  Problem Relation Age of Onset   Hypertension Mother    Kidney disease Father    Hypertension Maternal Grandmother     Social History   Tobacco Use   Smoking status: Never   Smokeless tobacco: Never  Vaping Use   Vaping Use: Former  Substance Use Topics   Alcohol use: No   Drug use: No    Allergies: No Known Allergies  Medications Prior to Admission  Medication Sig Dispense Refill Last Dose   Prenatal Vit-Fe Fumarate-FA (PREPLUS) 27-1 MG TABS Take 1 tablet by mouth daily. 90  tablet 3 01/21/2022   albuterol (VENTOLIN HFA) 108 (90 Base) MCG/ACT inhaler Inhale 2 puffs into the lungs every 6 (six) hours as needed for wheezing or shortness of breath. 8 g 1     Review of Systems  Constitutional: Negative.   Gastrointestinal:  Positive for abdominal pain and constipation.  Genitourinary:  Negative for vaginal bleeding and vaginal discharge.  All other systems reviewed and are negative.  Physical Exam   Blood pressure 118/72, pulse 98, temperature 98.3 F (36.8 C), temperature source Oral, resp. rate 16, height '5\' 5"'$  (1.651 m), weight 71.8 kg, last menstrual period 12/17/2021, SpO2 100 %, unknown if currently breastfeeding.  Physical Exam Constitutional:      Appearance: She is well-developed.  HENT:     Head: Normocephalic.  Eyes:     Pupils: Pupils are equal, round, and reactive to light.  Cardiovascular:     Rate and Rhythm: Normal rate and regular rhythm.     Heart sounds: Normal heart sounds.  Pulmonary:     Effort: Pulmonary effort is normal. No respiratory distress.     Breath sounds: Normal breath sounds.  Abdominal:  Palpations: Abdomen is soft.     Tenderness: There is no abdominal tenderness.  Genitourinary:    Vagina: No bleeding. Vaginal discharge: mucusy.    Comments: External: no lesion Vagina: small amount of white discharge     Musculoskeletal:        General: Normal range of motion.     Cervical back: Normal range of motion and neck supple.  Skin:    General: Skin is warm and dry.  Neurological:     Mental Status: She is alert and oriented to person, place, and time.  Psychiatric:        Mood and Affect: Mood normal.        Behavior: Behavior normal.     Results for orders placed or performed during the hospital encounter of 01/21/22 (from the past 24 hour(s))  CBC     Status: Abnormal   Collection Time: 01/21/22  7:27 PM  Result Value Ref Range   WBC 6.1 4.0 - 10.5 K/uL   RBC 3.87 3.87 - 5.11 MIL/uL   Hemoglobin 11.8  (L) 12.0 - 15.0 g/dL   HCT 34.0 (L) 36.0 - 46.0 %   MCV 87.9 80.0 - 100.0 fL   MCH 30.5 26.0 - 34.0 pg   MCHC 34.7 30.0 - 36.0 g/dL   RDW 13.2 11.5 - 15.5 %   Platelets 241 150 - 400 K/uL   nRBC 0.0 0.0 - 0.2 %  hCG, quantitative, pregnancy     Status: Abnormal   Collection Time: 01/21/22  7:27 PM  Result Value Ref Range   hCG, Beta Chain, Quant, S 19,099 (H) <5 mIU/mL   US OB Transvaginal  Result Date: 01/21/2022 CLINICAL DATA:  Pelvic pain. LMP: 12/17/2021 corresponding to an estimated gestational age of [redacted] weeks, 0 days. EXAM: TRANSVAGINAL OB ULTRASOUND TECHNIQUE: Transvaginal ultrasound was performed for complete evaluation of the gestation as well as the maternal uterus, adnexal regions, and pelvic cul-de-sac. COMPARISON:  Pelvic ultrasound dated 01/13/2022. FINDINGS: Intrauterine gestational sac: Single intrauterine gestational sac. Yolk sac:  Seen. Embryo:  Not present. Cardiac Activity: N/A Heart Rate: N/A bpm MSD: 9 mm   5 w   5 d Subchorionic hemorrhage:  None. Maternal uterus/adnexae: The ovaries are unremarkable. There is a corpus luteum in the right ovary. Small cystic structure in the anterior myometrium, likely a myometrial cyst. IMPRESSION: Single intrauterine gestational sac with an estimated gestational age of [redacted] weeks, 5 days. No fetal pole identified at this time. Follow-up with ultrasound in 7-11 days, or earlier if clinically indicated, recommended. Electronically Signed   By: Anner Crete M.D.   On: 01/21/2022 20:09     MAU Course  Procedures  MDM CBC, hcg quantitative ordered   Assessment and Plan  Assessment Cramping  Gestation of 5 weeks   Plan  - Precautions given - F/u US appointment 12/07  Sarah Reese 01/21/2022, 7:53 PM   CNM attestation:  I have seen and examined this patient and agree with above documentation in the medical student's note.   Sarah Reese is a 36 y.o. [phone] at 34w0dreporting cramping   PE: Patient Vitals for  the past 24 hrs:  BP Temp Temp src Pulse Resp SpO2 Height Weight  01/21/22 1852 118/72 98.3 F (36.8 C) Oral 98 16 100 % '5\' 5"'$  (1.651 m) 71.8 kg   Gen: calm comfortable, NAD Resp: normal effort, no distress Heart: Regular rate Abd: Soft, NT, gravid, S=D  ROS, labs, PMH reviewed  Orders Placed This  Encounter  Procedures   CBC   hCG, quantitative, pregnancy   Discharge patient   No orders of the defined types were placed in this encounter.   MDM [place], cbc. Hcg   Assessment: 1. Pelvic pain in pregnancy, antepartum, first trimester   2. [redacted] weeks gestation of pregnancy   3. Intrauterine pregnancy   4. Type O blood, Rh positive     Plan: - Discharge home in stable condition. - first trimester precautions. - Bleeding precautions reviewed  - Follow-up as scheduled at your doctor's office for next prenatal visit or sooner as needed if symptoms worsen. - Return to maternity admissions symptoms worsen  [place], CNM  01/21/22  9:25 PM

## 2022-01-21 NOTE — MAU Note (Signed)
UWTKTCC Sarah Reese is a 36 y.o. at 17w0dhere in MAU reporting: having some cramping in pelvic area. No bleeding. Has been constipated (last was 2 days ago). Denies urinary problems.  Onset of complaint: about an hour ago.  Pain score: m/m Vitals:   01/21/22 1406  BP: 108/68  Pulse: 75  Resp: 16  Temp: 98.5 F (36.9 C)  SpO2: 100%      Lab orders placed from triage: urine

## 2022-01-21 NOTE — MAU Note (Signed)
Sarah Reese is a 36 y.o. at 15w0dhere in MAU reporting: was here earlier, was so busy that she left.  Still cramping, more in her vagina. No bleeding. Still constipated, has not taken anything  Onset of complaint: earlier today Pain score: mild Vitals:   01/21/22 1852  BP: 118/72  Pulse: 98  Resp: 16  Temp: 98.3 F (36.8 C)  SpO2: 100%      Lab orders placed from triage:

## 2022-01-27 ENCOUNTER — Other Ambulatory Visit: Payer: Medicaid Other

## 2022-01-27 ENCOUNTER — Telehealth: Payer: Self-pay | Admitting: Emergency Medicine

## 2022-01-27 ENCOUNTER — Encounter: Payer: Self-pay | Admitting: Emergency Medicine

## 2022-01-27 ENCOUNTER — Other Ambulatory Visit: Payer: Self-pay | Admitting: Obstetrics and Gynecology

## 2022-01-27 MED ORDER — CEPHALEXIN 500 MG PO CAPS: 500.0000 mg | ORAL_CAPSULE | Freq: Four times a day (QID) | ORAL | 2 refills | Status: DC

## 2022-01-27 NOTE — Telephone Encounter (Signed)
TC to patient to discuss results, Rx.  Additional information sent via Mychart.

## 2022-02-01 ENCOUNTER — Inpatient Hospital Stay (HOSPITAL_COMMUNITY)
Admission: AD | Admit: 2022-02-01 | Discharge: 2022-02-01 | Disposition: A | Discharge status: Home / Self Care | Payer: Medicaid Other | Attending: Obstetrics and Gynecology | Admitting: Obstetrics and Gynecology

## 2022-02-01 DIAGNOSIS — Z3A01 Less than 8 weeks gestation of pregnancy: Secondary | ICD-10-CM | POA: Insufficient documentation

## 2022-02-01 DIAGNOSIS — O26891 Other specified pregnancy related conditions, first trimester: Secondary | ICD-10-CM | POA: Insufficient documentation

## 2022-02-01 DIAGNOSIS — R109 Unspecified abdominal pain: Secondary | ICD-10-CM | POA: Insufficient documentation

## 2022-02-01 LAB — URINALYSIS, ROUTINE W REFLEX MICROSCOPIC
Bilirubin Urine: NEGATIVE
Glucose, UA: NEGATIVE mg/dL
Hgb urine dipstick: NEGATIVE
Ketones, ur: NEGATIVE mg/dL
Leukocytes,Ua: NEGATIVE
Nitrite: NEGATIVE
Protein, ur: NEGATIVE mg/dL
Specific Gravity, Urine: 1.028 (ref 1.005–1.030)
pH: 6 (ref 5.0–8.0)

## 2022-02-01 NOTE — MAU Note (Signed)
.  BJSEGBT Sarah Reese is a 36 y.o. at 76w4dhere in MAU reporting: started having cramping today. Feels like she has a loss of pregnancy symptoms. She had a miscarriage in September and it feels the same way. Denies any vag bleeding or discharge.   Onset of complaint: today Pain score: 6 Vitals:   02/01/22 1712  BP: 111/66  Pulse: 80  Resp: 18  Temp: 98.5 F (36.9 C)     FHT:n/a Lab orders placed from triage:  u/a

## 2022-02-02 ENCOUNTER — Inpatient Hospital Stay (HOSPITAL_COMMUNITY)
Admission: AD | Admit: 2022-02-02 | Discharge: 2022-02-02 | Disposition: A | Discharge status: Home / Self Care | Payer: Medicaid Other | Attending: Obstetrics and Gynecology | Admitting: Obstetrics and Gynecology

## 2022-02-02 ENCOUNTER — Other Ambulatory Visit: Payer: Self-pay

## 2022-02-02 ENCOUNTER — Inpatient Hospital Stay (HOSPITAL_COMMUNITY): Payer: Medicaid Other

## 2022-02-02 DIAGNOSIS — O26851 Spotting complicating pregnancy, first trimester: Secondary | ICD-10-CM | POA: Insufficient documentation

## 2022-02-02 DIAGNOSIS — Z3490 Encounter for supervision of normal pregnancy, unspecified, unspecified trimester: Secondary | ICD-10-CM

## 2022-02-02 DIAGNOSIS — Z8759 Personal history of other complications of pregnancy, childbirth and the puerperium: Secondary | ICD-10-CM | POA: Insufficient documentation

## 2022-02-02 DIAGNOSIS — O26891 Other specified pregnancy related conditions, first trimester: Secondary | ICD-10-CM | POA: Insufficient documentation

## 2022-02-02 DIAGNOSIS — O09521 Supervision of elderly multigravida, first trimester: Secondary | ICD-10-CM | POA: Diagnosis not present

## 2022-02-02 DIAGNOSIS — Z3A01 Less than 8 weeks gestation of pregnancy: Secondary | ICD-10-CM | POA: Insufficient documentation

## 2022-02-02 DIAGNOSIS — R109 Unspecified abdominal pain: Secondary | ICD-10-CM | POA: Insufficient documentation

## 2022-02-02 LAB — URINALYSIS, ROUTINE W REFLEX MICROSCOPIC
Bilirubin Urine: NEGATIVE
Glucose, UA: NEGATIVE mg/dL
Hgb urine dipstick: NEGATIVE
Ketones, ur: 5 mg/dL — AB
Leukocytes,Ua: NEGATIVE
Nitrite: NEGATIVE
Protein, ur: NEGATIVE mg/dL
Specific Gravity, Urine: 1.026 (ref 1.005–1.030)
pH: 5 (ref 5.0–8.0)

## 2022-02-02 LAB — CBC
HCT: 32.9 % — ABNORMAL LOW (ref 36.0–46.0)
Hemoglobin: 11.5 g/dL — ABNORMAL LOW (ref 12.0–15.0)
MCH: 30.1 pg (ref 26.0–34.0)
MCHC: 35 g/dL (ref 30.0–36.0)
MCV: 86.1 fL (ref 80.0–100.0)
Platelets: 226 10*3/uL (ref 150–400)
RBC: 3.82 MIL/uL — ABNORMAL LOW (ref 3.87–5.11)
RDW: 13.1 % (ref 11.5–15.5)
WBC: 5.2 10*3/uL (ref 4.0–10.5)
nRBC: 0 % (ref 0.0–0.2)

## 2022-02-02 NOTE — MAU Provider Note (Signed)
History     CSN: [id number]  Arrival date and time: 02/02/22 1214   Event Date/Time   First Provider Initiated Contact with Patient 02/02/22 1230      Chief Complaint  Patient presents with   Abdominal Pain   HPI Sarah Reese is a 36 y.o. [id number] at 63w5dwho presents to MAU with chief complaint of abdominal pain. This is a new problem, onset yesterday. Patient presented to MAU yesterday but LWBS due to high census and long wait. Patient is tearful in triage. She has almost reached the [place] when she [place]ried. She denies vaginal bleeding, dysuria, fever or recent illness.  OB History     Gravida  5   Para  2   Term  2   Preterm  0   AB  2   Living  2      SAB  2   IAB  0   Ectopic  0   Multiple  0   Live Births  2           Past Medical History:  Diagnosis Date   Anemia    Asthma    Heart murmur    since pregnancy   Scoliosis     Past Surgical History:  Procedure Laterality Date   DILATION AND CURETTAGE OF UTERUS     WISDOM TOOTH EXTRACTION      Family History  Problem Relation Age of Onset   Hypertension Mother    Kidney disease Father    Hypertension Maternal Grandmother     Social History   Tobacco Use   Smoking status: Never   Smokeless tobacco: Never  Vaping Use   Vaping Use: Former  Substance Use Topics   Alcohol use: No   Drug use: No    Allergies: No Known Allergies  Medications Prior to Admission  Medication Sig Dispense Refill Last Dose   cephALEXin (KEFLEX) 500 MG capsule Take 1 capsule (500 mg total) by mouth 4 (four) times daily. 28 capsule 2    albuterol (VENTOLIN HFA) 108 (90 Base) MCG/ACT inhaler Inhale 2 puffs into the lungs every 6 (six) hours as needed for wheezing or shortness of breath. 8 g 1    Prenatal Vit-Fe Fumarate-FA (PREPLUS) 27-1 MG TABS Take 1 tablet by mouth daily. 90 tablet 3     Review of Systems  Gastrointestinal:  Positive for abdominal pain.  All other systems reviewed  and are negative.  Physical Exam   Blood pressure (!) 110/58, pulse 90, temperature 98.2 F (36.8 C), temperature source Oral, resp. rate 16, height '5\' 5"'$  (1.651 m), weight 73.7 kg, last menstrual period 12/17/2021, SpO2 100 %, unknown if currently breastfeeding.  Physical Exam Vitals and nursing note reviewed. Exam conducted with a chaperone present.  Constitutional:      Appearance: She is well-developed.  Neurological:     Mental Status: She is alert and oriented to person, place, and time.  Psychiatric:        Mood and Affect: Mood is anxious.        Behavior: Behavior normal.     MAU Course  Procedures  MDM Patient Vitals for the past 24 hrs:  BP Temp Temp src Pulse Resp SpO2 Height Weight  02/02/22 1227 (!) 110/58 98.2 F (36.8 C) Oral 90 16 100 % -- --  02/02/22 1224 -- -- -- -- -- -- '5\' 5"'$  (1.651 m) 73.7 kg   Results for orders placed or performed during the  hospital encounter of 02/02/22 (from the past 24 hour(s))  CBC     Status: Abnormal   Collection Time: 02/02/22 12:38 PM  Result Value Ref Range   WBC 5.2 4.0 - 10.5 K/uL   RBC 3.82 (L) 3.87 - 5.11 MIL/uL   Hemoglobin 11.5 (L) 12.0 - 15.0 g/dL   HCT 32.9 (L) 36.0 - 46.0 %   MCV 86.1 80.0 - 100.0 fL   MCH 30.1 26.0 - 34.0 pg   MCHC 35.0 30.0 - 36.0 g/dL   RDW 13.1 11.5 - 15.5 %   Platelets 226 150 - 400 K/uL   nRBC 0.0 0.0 - 0.2 %  Urinalysis, Routine w reflex microscopic Urine, Clean Catch     Status: Abnormal   Collection Time: 02/02/22  1:30 PM  Result Value Ref Range   Color, Urine AMBER (A) YELLOW   APPearance CLOUDY (A) CLEAR   Specific Gravity, Urine 1.026 1.005 - 1.030   pH 5.0 5.0 - 8.0   Glucose, UA NEGATIVE NEGATIVE mg/dL   Hgb urine dipstick NEGATIVE NEGATIVE   Bilirubin Urine NEGATIVE NEGATIVE   Ketones, ur 5 (A) NEGATIVE mg/dL   Protein, ur NEGATIVE NEGATIVE mg/dL   Nitrite NEGATIVE NEGATIVE   Leukocytes,Ua NEGATIVE NEGATIVE   US OB Transvaginal  Result Date: 02/02/2022 CLINICAL  DATA:  Pelvic pain in early pregnancy EXAM: TRANSVAGINAL OB ULTRASOUND TECHNIQUE: Transvaginal ultrasound was performed for complete evaluation of the gestation as well as the maternal uterus, adnexal regions, and pelvic cul-de-sac. COMPARISON:  01/21/2022 FINDINGS: Intrauterine gestational sac: Single Yolk sac:  Seen Embryo:  Seen Cardiac Activity: Seen Heart Rate: 173 bpm CRL:   7.7 mm   6 w 5 d                  [place] EDC: 09/23/2022 Subchorionic hemorrhage:  None visualized. Maternal uterus/adnexae: Unremarkable.  Cervix is closed. IMPRESSION: Single live intrauterine pregnancy seen. Sonographically estimated gestational age is 6 weeks 5 days. Electronically Signed   By: Elmer Picker M.D.   On: 02/02/2022 13:05     Assessment and Plan  --36 y.o. [id number] at [redacted]w[redacted]d --No concerning findings on today's assessment --Discharge home in stable condition  SDarlina Rumpf MVilas MSN, CNM 02/02/2022, 2:54 PM

## 2022-02-02 NOTE — MAU Note (Signed)
POLIDCV Sarah Reese is a 36 y.o. at 28w5dhere in MAU reporting: cramping and absence of pregnancy symptoms. States she came in for evaluation yesterday but it was busy so she left. No bleeding or abnormal discharge.  Onset of complaint: yesterday  Pain score: 7/10 when it comes, 0/10 currently  Vitals:   02/02/22 1227  BP: (!) 110/58  Pulse: 90  Resp: 16  Temp: 98.2 F (36.8 C)  SpO2: 100%     FHT:NA  Lab orders placed from triage: UA

## 2022-02-07 ENCOUNTER — Ambulatory Visit
Admission: RE | Admit: 2022-02-07 | Discharge: 2022-02-07 | Disposition: A | Discharge status: Home / Self Care | Payer: Medicaid Other | Source: Ambulatory Visit | Attending: Advanced Practice Midwife | Admitting: Advanced Practice Midwife

## 2022-02-07 DIAGNOSIS — O26891 Other specified pregnancy related conditions, first trimester: Secondary | ICD-10-CM | POA: Diagnosis present

## 2022-02-07 DIAGNOSIS — R109 Unspecified abdominal pain: Secondary | ICD-10-CM | POA: Insufficient documentation

## 2022-02-21 NOTE — L&D Delivery Note (Cosign Needed Addendum)
LABOR COURSE Initial CVE 1/60/-3, progressed to complete after augmentation with pitocin  Delivery Note Called to room and patient was complete and pushing. Head delivered ROA. 1 nuchal reduced. Shoulder and body delivered in usual fashion. At  0313 a viable malewas delivered via Vaginal, Spontaneous.  Infant with spontaneous cry, placed on mother's abdomen, dried and stimulated. Cord clamped x 2 after 1-minute delay, and cut by partner of MOB. Cord blood drawn. Placenta delivered spontaneously with gentle cord traction. Appears intact. Fundus firm with massage and Pitocin. Labia, perineum, vagina, and cervix inspected with no lacerations.    APGAR: 8, 9; weight pending.   Cord: 3VC with the following complications:none.    Anesthesia: epidural  Episiotomy: None Lacerations: none Est. Blood Loss (mL): 184  Mom to postpartum.  Baby to Couplet care / Skin to Skin.  Para March, DO 09/11/2022 3:23 AM     ___ GME ATTESTATION:  Evaluation and management procedures were performed by the [facility] Medicine Resident under my supervision. I was immediately available for direct supervision, assistance and direction throughout this encounter.  I also confirm that I have verified the information documented in the resident's note, and that I have also personally reperformed the pertinent components of the physical exam and all of the medical decision making activities.  I have also made any necessary editorial changes.  Myrtie Hawk, DO OB Fellow, [facility], [facility] 09/11/2022 5:20 AM

## 2022-03-09 ENCOUNTER — Encounter: Payer: Self-pay | Admitting: *Deleted

## 2022-03-09 ENCOUNTER — Telehealth (INDEPENDENT_AMBULATORY_CARE_PROVIDER_SITE_OTHER): Payer: Medicaid Other | Admitting: *Deleted

## 2022-03-09 ENCOUNTER — Telehealth: Payer: Self-pay | Admitting: *Deleted

## 2022-03-09 ENCOUNTER — Encounter: Payer: Medicaid Other | Admitting: *Deleted

## 2022-03-09 DIAGNOSIS — O09899 Supervision of other high risk pregnancies, unspecified trimester: Secondary | ICD-10-CM

## 2022-03-09 DIAGNOSIS — O09529 Supervision of elderly multigravida, unspecified trimester: Secondary | ICD-10-CM | POA: Insufficient documentation

## 2022-03-09 DIAGNOSIS — R011 Cardiac murmur, unspecified: Secondary | ICD-10-CM

## 2022-03-09 DIAGNOSIS — O0990 Supervision of high risk pregnancy, unspecified, unspecified trimester: Secondary | ICD-10-CM | POA: Insufficient documentation

## 2022-03-09 DIAGNOSIS — Z3689 Encounter for other specified antenatal screening: Secondary | ICD-10-CM

## 2022-03-09 DIAGNOSIS — M4120 Other idiopathic scoliosis, site unspecified: Secondary | ICD-10-CM

## 2022-03-09 MED ORDER — BLOOD PRESSURE KIT DEVI: 1.0000 | 0 refills | Status: DC | PRN

## 2022-03-09 MED ORDER — GOJJI WEIGHT SCALE MISC: 1.0000 | 0 refills | Status: DC

## 2022-03-09 NOTE — Telephone Encounter (Signed)
Kyree scheduled for Virtual new ob intake today at 2:15. At 2:23 I went into virtual room and Breyon was signed in but I was not able to see or hear her.  I waited and after 2-3 minutes she disconnected. I called her and informed her I was calling re: her virtual appointment and explained I had came in but did not see or hear her . She reports she had signed in and waited for like 15 minutes and did not see or hear anyone.She also reports she knows she can't do this and drive and she has to pick up her kids. I explained I will check with registar and see if we can just have her come in for her new ob as scheduled next week.  She voices understanding. [name]

## 2022-03-09 NOTE — Progress Notes (Signed)
New OB Intake  Patient did not want new ob rescheduled due to intake not being done at 2:15 but agreeable to intake rescheduled to 4:15   I connected with Sarah Reese  on 03/09/22 at  4:15 PM EST by MyChart Video Visit and verified that I am speaking with the correct person using two identifiers. Nurse is located at [facility] and pt is located at home.  I discussed the limitations, risks, security and privacy concerns of performing an evaluation and management service by telephone and the availability of in person appointments. I also discussed with the patient that there may be a patient responsible charge related to this service. The patient expressed understanding and agreed to proceed.  I explained I am completing New OB Intake today. We discussed EDD of 09/23/22 that is based on LMP of 12/17/21. Pt is G5/P2022. I reviewed her allergies, medications, Medical/Surgical/OB history, and appropriate screenings. I informed her of [facility] services. [facility] information placed in AVS. Based on history, this is a high risk pregnancy.  Patient Active Problem List   Diagnosis Date Noted   Supervision of high risk pregnancy, antepartum 03/09/2022   AMA (advanced maternal age) multigravida 35+ 03/09/2022   Short interval between pregnancies affecting pregnancy, antepartum 03/09/2022   Scoliosis, or kyphoscoliosis, idiopathic 09/30/2021   Trigger finger of right hand 04/26/2021   Carpal tunnel syndrome of right wrist 04/26/2021   Cardiac murmur 04/04/2016    Concerns addressed today  Delivery Plans Plans to deliver at [facility]. Patient given information for [facility] Healthy Baby website for more information about [facility] and [employer]. Patient is interested in water birth. Offered upcoming OB visit with CNM to discuss further.  MyChart/Babyscripts MyChart access verified. I explained pt will have some visits in office and some virtually. Babyscripts instructions not given and order not placed.    Blood Pressure Cuff/Weight Scale Blood pressure cuff ordered for patient to pick-up from [employer]. Explained after first prenatal appt pt will check weekly and document in 10. Patient does not have weight scale; order sent to [place], patient may track weight weekly in Babyscripts.  Anatomy US Explained first scheduled [place] will be around 19 weeks. Anatomy US scheduled for 04/29/22 at 1:00. Pt notified to arrive at 1245.  Labs Discussed Johnsie Cancel genetic screening with patient. Would like both Panorama and Horizon drawn at new OB visit. Routine prenatal labs needed.  COVID Vaccine Patient has had COVID vaccine.   Is patient a CenteringPregnancy candidate?  Accepted   Is patient a Mom+Baby Combined Care candidate?  Not a candidate    Social Determinants of Health Food Insecurity: not assessed WIC Referral: Patient is interested in referral to [facility]  Transportation: not assessed Childcare: Discussed no children allowed at ultrasound appointments. Offered childcare services; patient declines childcare services at this time.  First visit review I reviewed new OB appt with patient. I explained they will have a provider visit that includes pregnancy exam, labs. Explained pt will be seen by Dr. Kennon Rounds at first visit; encounter routed to appropriate provider. Explained that patient will be seen by pregnancy navigator following visit with provider.   [name] 03/09/2022  4:47 PM

## 2022-03-10 NOTE — Progress Notes (Signed)
Patient seen and assessed by nursing staff.  Agree with documentation and plan.  

## 2022-03-14 NOTE — Progress Notes (Unsigned)
Patient not able to keep this appointment and was rescheduled for afternoon. Sarah Reese encounter was created in error - please disregard.

## 2022-03-17 ENCOUNTER — Other Ambulatory Visit (HOSPITAL_COMMUNITY)
Admission: RE | Admit: 2022-03-17 | Discharge: 2022-03-17 | Disposition: A | Discharge status: Home / Self Care | Payer: Medicaid Other | Source: Ambulatory Visit | Attending: Family Medicine | Admitting: Family Medicine

## 2022-03-17 ENCOUNTER — Encounter: Payer: Self-pay | Admitting: Family Medicine

## 2022-03-17 ENCOUNTER — Other Ambulatory Visit: Payer: Self-pay

## 2022-03-17 ENCOUNTER — Ambulatory Visit (INDEPENDENT_AMBULATORY_CARE_PROVIDER_SITE_OTHER): Payer: Medicaid Other | Admitting: Family Medicine

## 2022-03-17 VITALS — BP 118/80 | HR 92 | Wt 169.1 lb

## 2022-03-17 DIAGNOSIS — O0900 Supervision of pregnancy with history of infertility, unspecified trimester: Secondary | ICD-10-CM | POA: Diagnosis present

## 2022-03-17 DIAGNOSIS — Z3A12 12 weeks gestation of pregnancy: Secondary | ICD-10-CM

## 2022-03-17 DIAGNOSIS — J4520 Mild intermittent asthma, uncomplicated: Secondary | ICD-10-CM

## 2022-03-17 DIAGNOSIS — O0991 Supervision of high risk pregnancy, unspecified, first trimester: Secondary | ICD-10-CM

## 2022-03-17 DIAGNOSIS — O0990 Supervision of high risk pregnancy, unspecified, unspecified trimester: Secondary | ICD-10-CM

## 2022-03-17 MED ORDER — ALBUTEROL SULFATE HFA 108 (90 BASE) MCG/ACT IN AERS: 2.0000 | INHALATION_SPRAY | Freq: Four times a day (QID) | RESPIRATORY_TRACT | 1 refills | Status: AC | PRN

## 2022-03-17 MED ORDER — PREPLUS 27-1 MG PO TABS: 1.0000 | ORAL_TABLET | Freq: Every day | ORAL | 3 refills | Status: AC

## 2022-03-17 NOTE — Progress Notes (Signed)
Subjective:   Sarah Reese is a 37 y.o. [id number] at 55w6dby LMP, early ultrasound being seen today for her first obstetrical visit.  Her obstetrical history is not significant. Pregnancy history fully reviewed.  Patient reports fatigue and depression symptoms which is situational and related to job loss .  HISTORY: OB History  Gravida Para Term Preterm AB Living  '5 2 2 '$ 0 2 2  SAB IAB Ectopic Multiple Live Births  2 0 0 0 2    # Outcome Date GA Lbr Len/2nd Weight Sex Delivery Anes PTL Lv  5 Current           4 SAB 10/26/21 752w0d  SAB     3 Term 06/19/16 3971w1d:58 / 00:02 8 lb 9.8 oz (3.906 kg) M Vag-Spont None  LIV     Birth Comments: facial bruising     Name: BULCARSYN, BOSTER  Apgar1: 9  Apgar5: 9  2 Term 09/22/09 40w48w0dlb 5 oz (3.771 kg)  Vag-Spont None  LIV     Birth Comments: wnl  1 SAB 09/2008 [id number]     Last pap smear was 11/2015  and was normal Past Medical History:  Diagnosis Date   Anemia    Asthma    Heart murmur    since pregnancy   Scoliosis    Past Surgical History:  Procedure Laterality Date   DILATION AND CURETTAGE OF UTERUS     WISDOM TOOTH EXTRACTION     Family History  Problem Relation Age of Onset   Hypertension Mother    Kidney disease Father    Hypertension Maternal Grandmother    Social History   Tobacco Use   Smoking status: Never   Smokeless tobacco: Never  Vaping Use   Vaping Use: Former   Quit date: 03/03/2021   Devices: one month trial  Substance Use Topics   Alcohol use: No   Drug use: No   No Known Allergies Current Outpatient Medications on File Prior to Visit  Medication Sig Dispense Refill   Blood Pressure Monitoring (BLOOD PRESSURE KIT) DEVI 1 Device by Does not apply route as needed. 1 each 0   Misc. Devices (GOJJI WEIGHT SCALE) MISC 1 Device by Does not apply route once a week. 1 each 0   No current facility-administered medications on file prior to visit.     Exam   Vitals:    03/17/22 1524  BP: 118/80  Pulse: 92  Weight: 169 lb 1.6 oz (76.7 kg)      Uterus:   12 weeks size  Pelvic Exam: Perineum: no hemorrhoids, normal perineum   Vulva: normal external genitalia, no lesions   Vagina:  normal mucosa, normal discharge   Cervix: no lesions and normal, pap smear done.    Adnexa: normal adnexa and no mass, fullness, tenderness   Bony Pelvis: average  System: General: well-developed, well-nourished female in no acute distress   Breast:  normal appearance, no masses or tenderness   Skin: normal coloration and turgor, no rashes   Neurologic: oriented, normal, negative, normal mood   Extremities: normal strength, tone, and muscle mass, ROM of all joints is normal   HEENT PERRLA, extraocular movement intact and sclera clear, anicteric   Mouth/Teeth mucous membranes moist, pharynx normal without lesions and dental hygiene good   Neck supple and no masses   Cardiovascular: regular rate and rhythm   Respiratory:  no  respiratory distress, normal breath sounds   Abdomen: soft, non-tender; bowel sounds normal; no masses,  no organomegaly    Bedside u/s done due to no FHR found on Doppler--Fetus appears appropriately grown, + flicker, + movement Assessment:   Pregnancy: [id number] Patient Active Problem List   Diagnosis Date Noted   Supervision of high risk pregnancy, antepartum 03/09/2022   AMA (advanced maternal age) multigravida 35+ 03/09/2022   Short interval between pregnancies affecting pregnancy, antepartum 03/09/2022   Scoliosis, or kyphoscoliosis, idiopathic 09/30/2021   Trigger finger of right hand 04/26/2021   Carpal tunnel syndrome of right wrist 04/26/2021   Cardiac murmur 04/04/2016     Plan:  1. Supervision of high risk pregnancy, antepartum New OB labs done today - CH[facility] AMB BABYSCRIPTS SCHEDULE OPTIMIZATION - CBC/D/Plt+RPR+Rh+ABO+RubIgG... - HgB A1c - Panorama Prenatal Test Full Panel - Culture, OB Urine - Cytology - PAP( [place]) -  Horizon SMA - Prenatal Vit-Fe Fumarate-FA (PREPLUS) 27-1 MG TABS; Take 1 tablet by mouth daily.  Dispense: 90 tablet; Refill: 3  2. Mild intermittent asthma without complication Refill her inhaler, if using > 2x/ week, will need inhaled steroid - albuterol (VENTOLIN HFA) 108 (90 Base) MCG/ACT inhaler; Inhale 2 puffs into the lungs every 6 (six) hours as needed for wheezing or shortness of breath.  Dispense: 8 g; Refill: 1   Initial labs drawn. Continue prenatal vitamins. Genetic Screening discussed, NIPS: ordered. Ultrasound discussed; fetal anatomic survey: ordered. Problem list reviewed and updated. For Centering group 11 Routine obstetric precautions reviewed. No follow-ups on file.

## 2022-03-17 NOTE — Patient Instructions (Signed)
[facility] [address], [place], [place] [address] [phone] Hours: Sunday Closed Monday 9AM-6PM Tuesday 9AM-6PM Wednesday 9AM-6PM Thursday 9AM-6PM Friday           9AM-6PM Saturday         10AM-1PM  

## 2022-03-18 LAB — CBC/D/PLT+RPR+RH+ABO+RUBIGG...
Antibody Screen: NEGATIVE
Basophils Absolute: 0 10*3/uL (ref 0.0–0.2)
Basos: 1 %
EOS (ABSOLUTE): 0.1 10*3/uL (ref 0.0–0.4)
Eos: 1 %
HCV Ab: NONREACTIVE
HIV Screen 4th Generation wRfx: NONREACTIVE
Hematocrit: 30.8 % — ABNORMAL LOW (ref 34.0–46.6)
Hemoglobin: 10.4 g/dL — ABNORMAL LOW (ref 11.1–15.9)
Hepatitis B Surface Ag: NEGATIVE
Immature Grans (Abs): 0 10*3/uL (ref 0.0–0.1)
Immature Granulocytes: 0 %
Lymphocytes Absolute: 1.9 10*3/uL (ref 0.7–3.1)
Lymphs: 29 %
MCH: 29.4 pg (ref 26.6–33.0)
MCHC: 33.8 g/dL (ref 31.5–35.7)
MCV: 87 fL (ref 79–97)
Monocytes Absolute: 0.6 10*3/uL (ref 0.1–0.9)
Monocytes: 10 %
Neutrophils Absolute: 3.9 10*3/uL (ref 1.4–7.0)
Neutrophils: 59 %
Platelets: 223 10*3/uL (ref 150–450)
RBC: 3.54 x10E6/uL — ABNORMAL LOW (ref 3.77–5.28)
RDW: 12.9 % (ref 11.7–15.4)
RPR Ser Ql: NONREACTIVE
Rh Factor: POSITIVE
Rubella Antibodies, IGG: 2.43 index (ref >0.99)
WBC: 6.6 10*3/uL (ref 3.4–10.8)

## 2022-03-18 LAB — HCV INTERPRETATION

## 2022-03-18 LAB — HEMOGLOBIN A1C
Est. average glucose Bld gHb Est-mCnc: 114 mg/dL
Hgb A1c MFr Bld: 5.6 % (ref 4.8–5.6)

## 2022-03-18 NOTE — Addendum Note (Signed)
Addended by: [name] on: 03/18/2022 01:31 PM   Modules accepted: Orders

## 2022-03-19 LAB — CULTURE, OB URINE

## 2022-03-19 LAB — URINE CULTURE, OB REFLEX

## 2022-03-21 NOTE — BH Specialist Note (Signed)
Pt did not arrive to video visit and did not answer the phone; Left HIPPA-compliant message to call back Kamyia Thomason from [facility]  [facility] at  [phone] (Rolande Moe's office).  ?; left MyChart message for patient.  ? ?

## 2022-03-22 ENCOUNTER — Inpatient Hospital Stay (HOSPITAL_COMMUNITY)
Admission: AD | Admit: 2022-03-22 | Discharge: 2022-03-22 | Disposition: A | Discharge status: Home / Self Care | Payer: Medicaid Other | Attending: Obstetrics and Gynecology | Admitting: Obstetrics and Gynecology

## 2022-03-22 ENCOUNTER — Encounter (HOSPITAL_COMMUNITY): Payer: Self-pay | Admitting: Obstetrics and Gynecology

## 2022-03-22 DIAGNOSIS — Z3A13 13 weeks gestation of pregnancy: Secondary | ICD-10-CM | POA: Insufficient documentation

## 2022-03-22 DIAGNOSIS — N930 Postcoital and contact bleeding: Secondary | ICD-10-CM | POA: Insufficient documentation

## 2022-03-22 DIAGNOSIS — R1010 Upper abdominal pain, unspecified: Secondary | ICD-10-CM | POA: Diagnosis present

## 2022-03-22 DIAGNOSIS — O0990 Supervision of high risk pregnancy, unspecified, unspecified trimester: Secondary | ICD-10-CM

## 2022-03-22 DIAGNOSIS — O09899 Supervision of other high risk pregnancies, unspecified trimester: Secondary | ICD-10-CM

## 2022-03-22 DIAGNOSIS — O26891 Other specified pregnancy related conditions, first trimester: Secondary | ICD-10-CM | POA: Diagnosis not present

## 2022-03-22 LAB — CYTOLOGY - PAP
Chlamydia: NEGATIVE
Comment: NEGATIVE
Comment: NEGATIVE
Comment: NEGATIVE
Comment: NORMAL
Diagnosis: NEGATIVE
High risk HPV: NEGATIVE
Neisseria Gonorrhea: NEGATIVE
Trichomonas: NEGATIVE

## 2022-03-22 NOTE — Progress Notes (Signed)
Written and verbal d/c instructions given and understanding voiced. 

## 2022-03-22 NOTE — MAU Note (Signed)
Laury Deep CNM in to see pt and discuss plan of care. CNM did bedside u/s and pt reassured

## 2022-03-22 NOTE — MAU Note (Signed)
.  Sarah Reese is a 37 y.o. at 31w4dhere in MAU reporting pink spotting about 2200. Last intercourse was Sunday. No pain.  Onset of complaint: 2200 Pain score: 0 Vitals:   03/22/22 2235 03/22/22 2238  BP:  121/68  Pulse: 84   Resp: 17   Temp: 98.6 F (37 C)   SpO2: 100%      FHT:164 Lab orders placed from triage:  none

## 2022-03-22 NOTE — Discharge Instructions (Signed)
NO SEX UNTIL BLEEDING IS RESOLVED  REDUCE THE SIZE OF YOUR SEX TOYS UNTIL AFTER YOU ARE CLEARED TO RESUME AFTER DELIVERY

## 2022-03-23 ENCOUNTER — Telehealth: Payer: Self-pay | Admitting: *Deleted

## 2022-03-23 NOTE — MAU Provider Note (Signed)
History     CSN: [id number]  Arrival date and time: 03/22/22 2220   Event Date/Time   First Provider Initiated Contact with Patient 03/23/22 0010      Chief Complaint  Patient presents with   Vaginal Bleeding   HPI [name] is a 37 y.o. year old G48P2022 female at 69w5dweeks gestation who presents to MAU reporting pink spotting and some upper abdominal cramping. She reports having SI with her female partner (who "used a 9 inch strap on dildo") Sunday 03/20/2022. She reports she has had complications with constipation lately. She has a h/o miscarriage and is concerned. Although she has not miscarried this late in pregnancy before. She denies that the bleeding or cramping is on the same level as when she had a miscarriage.   OB History     Gravida  5   Para  2   Term  2   Preterm  0   AB  2   Living  2      SAB  2   IAB  0   Ectopic  0   Multiple  0   Live Births  2           Past Medical History:  Diagnosis Date   Anemia    Asthma    Heart murmur    since pregnancy   Scoliosis     Past Surgical History:  Procedure Laterality Date   DILATION AND CURETTAGE OF UTERUS     WISDOM TOOTH EXTRACTION      Family History  Problem Relation Age of Onset   Hypertension Mother    Kidney disease Father    Hypertension Maternal Grandmother     Social History   Tobacco Use   Smoking status: Never   Smokeless tobacco: Never  Vaping Use   Vaping Use: Former   Quit date: 03/03/2021   Devices: one month trial  Substance Use Topics   Alcohol use: No   Drug use: No    Allergies: No Known Allergies  No medications prior to admission.    Review of Systems  Constitutional: Negative.   HENT: Negative.    Eyes: Negative.   Respiratory: Negative.    Cardiovascular: Negative.   Gastrointestinal:  Positive for abdominal pain (upper abd crmaping since being in MAU).  Endocrine: Negative.   Genitourinary:  Positive for vaginal bleeding  ("Very light pink at 2200. Now brownish like it's trying to go away.").  Musculoskeletal: Negative.   Skin: Negative.   Allergic/Immunologic: Negative.   Neurological: Negative.   Hematological: Negative.   Psychiatric/Behavioral: Negative.     Physical Exam   Blood pressure 121/68, pulse 84, temperature 98.6 F (37 C), resp. rate 17, height '5\' 5"'$  (1.651 m), weight 77.1 kg, last menstrual period 12/17/2021, SpO2 100 %, unknown if currently breastfeeding.  Physical Exam Vitals and nursing note reviewed.  Constitutional:      Appearance: Normal appearance. She is normal weight.  Pulmonary:     Effort: Pulmonary effort is normal.  Abdominal:     Palpations: Abdomen is soft.     Tenderness: There is no abdominal tenderness.  Genitourinary:    Comments: deferred Musculoskeletal:        General: Normal range of motion.  Skin:    General: Skin is warm and dry.  Neurological:     Mental Status: She is alert and oriented to person, place, and time.  Psychiatric:        Mood  and Affect: Mood normal.        Behavior: Behavior normal.        Thought Content: Thought content normal.        Judgment: Judgment normal.    FHTs by doppler: 164 bpm  MAU Course  Procedures Patient informed that the ultrasound is considered a limited OB ultrasound and is not intended to be a complete ultrasound exam.  Patient also informed that the ultrasound is not being completed with the intent of assessing for fetal or placental anomalies or any pelvic abnormalities.  Explained that the purpose of today's ultrasound is to assess for viability.  Baby was found to be viable and very active. Pictures given for patient's reassurance. Patient relieved to see baby is doing well. Patient acknowledges the purpose of the exam and the limitations of the study.  MDM Informal BS U/S  Assessment and Plan  1. Postcoital bleeding - Advised to be on pelvic rest until bleeding has resolved - Recommended reducing size  of toys until after postpartum visit - Information provided on VB in pregnancy in 1st trimester   2. Abdominal pain in Pregnancy - Information provided on abdominal pain in pregnancy  3. [redacted] weeks gestation of pregnancy   - Discharge patient - Keep scheduled Centering Pregnancy appt on 04/06/2022 - Patient verbalized an understanding of the plan of care and agrees.    Laury Deep, CNM 03/22/2022, 11:54 PM

## 2022-03-23 NOTE — Telephone Encounter (Signed)
I called Sarah Reese to remind her of her next prenatal care appointment 04/06/22 CenteringPregnancy prenatal care 9am-11am. I reviewed appointment information. She also asked if it was ok to be spotting. I reviewed her chart and we discussed she went to MAU 02/3022 for c/o spotting after intercourse with dildo with partner. She was evaluated and discharged. Today she states she is just having scant brownish and it is getting less. I reviewed when to go to hospital and the recommendations from MAU provider.  She voices understanding. [name]

## 2022-03-25 LAB — HORIZON SMA
REPORT SUMMARY: NEGATIVE
SPINAL MUSCULAR ATROPHY: NEGATIVE

## 2022-03-26 LAB — PANORAMA PRENATAL TEST FULL PANEL:PANORAMA TEST PLUS 5 ADDITIONAL MICRODELETIONS: FETAL FRACTION: 13

## 2022-04-04 ENCOUNTER — Other Ambulatory Visit: Payer: Self-pay

## 2022-04-04 ENCOUNTER — Ambulatory Visit: Payer: Medicaid Other | Admitting: Clinical

## 2022-04-04 DIAGNOSIS — Z91199 Patient's noncompliance with other medical treatment and regimen due to unspecified reason: Secondary | ICD-10-CM

## 2022-04-06 ENCOUNTER — Encounter: Payer: Self-pay | Admitting: *Deleted

## 2022-04-06 ENCOUNTER — Other Ambulatory Visit: Payer: Self-pay

## 2022-04-06 ENCOUNTER — Ambulatory Visit (INDEPENDENT_AMBULATORY_CARE_PROVIDER_SITE_OTHER): Payer: Medicaid Other | Admitting: Advanced Practice Midwife

## 2022-04-06 VITALS — BP 122/76 | HR 86 | Wt 173.2 lb

## 2022-04-06 DIAGNOSIS — O0990 Supervision of high risk pregnancy, unspecified, unspecified trimester: Secondary | ICD-10-CM

## 2022-04-06 DIAGNOSIS — O09522 Supervision of elderly multigravida, second trimester: Secondary | ICD-10-CM

## 2022-04-06 DIAGNOSIS — O0992 Supervision of high risk pregnancy, unspecified, second trimester: Secondary | ICD-10-CM

## 2022-04-06 DIAGNOSIS — Z3A15 15 weeks gestation of pregnancy: Secondary | ICD-10-CM

## 2022-04-06 NOTE — Progress Notes (Signed)
   PRENATAL VISIT NOTE  Subjective:  Sarah Reese is a 37 y.o. [id number] at 52w5dbeing seen today for ongoing prenatal care.  She is currently monitored for the following issues for this high-risk pregnancy and has Cardiac murmur; Trigger finger of right hand; Carpal tunnel syndrome of right wrist; Scoliosis, or kyphoscoliosis, idiopathic; Supervision of high risk pregnancy, antepartum; AMA (advanced maternal age) multigravida 35+; and Short interval between pregnancies affecting pregnancy, antepartum on their problem list.  Patient reports no complaints.  Contractions: Not present.  .  Movement: Present. Denies leaking of fluid.   The following portions of the patient's history were reviewed and updated as appropriate: allergies, current medications, past family history, past medical history, past social history, past surgical history and problem list.   Objective:   Vitals:   04/06/22 1032  BP: 122/76  Pulse: 86  Weight: 173 lb 3.2 oz (78.6 kg)    Fetal Status: Fetal Heart Rate (bpm): 153 Fundal Height: 16 cm Movement: Present     General:  Alert, oriented and cooperative. Patient is in no acute distress.  Skin: Skin is warm and dry. No rash noted.   Cardiovascular: Normal heart rate noted  Respiratory: Normal respiratory effort, no problems with respiration noted  Abdomen: Soft, gravid, appropriate for gestational age.  Pain/Pressure: Absent     Pelvic: Cervical exam deferred        Extremities: Normal range of motion.     Mental Status: Normal mood and affect. Normal behavior. Normal judgment and thought content.   Assessment and Plan:  Pregnancy: GY1E5631at 174w5d. Supervision of high risk pregnancy, antepartum  - AFP, Serum, Open Spina Bifida; Future  2. Multigravida of advanced maternal age in second trimester - Third trimester growth [place]. [redacted] weeks gestation of pregnancy   Preterm labor symptoms and general obstetric precautions including but not limited  to vaginal bleeding, contractions, leaking of fluid and fetal movement were reviewed in detail with the patient. Please refer to After Visit Summary for other counseling recommendations.     Centering Pregnancy, Session#1: Introduction to model of care. Group determined rules for self-governance and closing phrase. Oriented group to space and mother's notebook.   Facilitated discussion today:  common discomforts, When to call practice  Mindfulness activity completed as well as introduction to deep breathing for childbirth preparation- Centering 3-part breaths  Fundal height and FHR appropriate today unless noted otherwise in plan of care. Patient to continue group care.     Future Appointments  Date Time Provider [place]3/09/2022 11:00 AM [facility]/09/2022 12:45 PM [facility] NURSE [facility]3/09/2022  1:00 PM [facility] US1 [facility]05/04/2022  9:00 AM CENTERING PROVIDER [facility]4/11/2022  9:00 AM CENTERING PROVIDER [facility]5/08/2022  8:20 AM [facility] LAB [facility]5/09/2022  9:00 AM CENTERING PROVIDER [facility]5/22/2024  9:00 AM CENTERING PROVIDER [facility]6/06/2022  9:00 AM CENTERING PROVIDER [facility]6/19/2024  9:00 AM CENTERING PROVIDER [facility]7/04/2022  9:00 AM CENTERING PROVIDER [facility]7/17/2024  9:00 AM CENTERING PROVIDER [facility]7/31/2024  9:00 AM CENTERING PROVIDER [facility] WMS[facility]CNM

## 2022-04-06 NOTE — Addendum Note (Signed)
Addended by: Samuel Germany on: 04/06/2022 02:44 PM   Modules accepted: Orders

## 2022-04-06 NOTE — Patient Instructions (Signed)

## 2022-04-13 ENCOUNTER — Telehealth: Payer: Medicaid Other

## 2022-04-20 ENCOUNTER — Encounter: Payer: Medicaid Other | Admitting: Certified Nurse Midwife

## 2022-04-29 ENCOUNTER — Other Ambulatory Visit: Payer: Medicaid Other

## 2022-04-29 ENCOUNTER — Ambulatory Visit (HOSPITAL_BASED_OUTPATIENT_CLINIC_OR_DEPARTMENT_OTHER): Payer: Medicaid Other | Admitting: Maternal & Fetal Medicine

## 2022-04-29 ENCOUNTER — Ambulatory Visit: Payer: Medicaid Other | Attending: Maternal & Fetal Medicine

## 2022-04-29 ENCOUNTER — Encounter: Payer: Self-pay | Admitting: *Deleted

## 2022-04-29 ENCOUNTER — Other Ambulatory Visit: Payer: Self-pay | Admitting: *Deleted

## 2022-04-29 ENCOUNTER — Ambulatory Visit: Payer: Medicaid Other | Admitting: *Deleted

## 2022-04-29 VITALS — BP 132/64 | HR 92

## 2022-04-29 DIAGNOSIS — O4442 Low lying placenta NOS or without hemorrhage, second trimester: Secondary | ICD-10-CM | POA: Insufficient documentation

## 2022-04-29 DIAGNOSIS — J45909 Unspecified asthma, uncomplicated: Secondary | ICD-10-CM

## 2022-04-29 DIAGNOSIS — O35EXX0 Maternal care for other (suspected) fetal abnormality and damage, fetal genitourinary anomalies, not applicable or unspecified: Secondary | ICD-10-CM | POA: Diagnosis not present

## 2022-04-29 DIAGNOSIS — O35BXX0 Maternal care for other (suspected) fetal abnormality and damage, fetal cardiac anomalies, not applicable or unspecified: Secondary | ICD-10-CM

## 2022-04-29 DIAGNOSIS — O35AXX0 Maternal care for other (suspected) fetal abnormality and damage, fetal facial anomalies, not applicable or unspecified: Secondary | ICD-10-CM | POA: Diagnosis not present

## 2022-04-29 DIAGNOSIS — O99012 Anemia complicating pregnancy, second trimester: Secondary | ICD-10-CM

## 2022-04-29 DIAGNOSIS — O283 Abnormal ultrasonic finding on antenatal screening of mother: Secondary | ICD-10-CM

## 2022-04-29 DIAGNOSIS — O09522 Supervision of elderly multigravida, second trimester: Secondary | ICD-10-CM

## 2022-04-29 DIAGNOSIS — R011 Cardiac murmur, unspecified: Secondary | ICD-10-CM

## 2022-04-29 DIAGNOSIS — O09899 Supervision of other high risk pregnancies, unspecified trimester: Secondary | ICD-10-CM

## 2022-04-29 DIAGNOSIS — Z3A19 19 weeks gestation of pregnancy: Secondary | ICD-10-CM | POA: Diagnosis present

## 2022-04-29 DIAGNOSIS — D649 Anemia, unspecified: Secondary | ICD-10-CM

## 2022-04-29 DIAGNOSIS — O0990 Supervision of high risk pregnancy, unspecified, unspecified trimester: Secondary | ICD-10-CM

## 2022-04-29 DIAGNOSIS — M4120 Other idiopathic scoliosis, site unspecified: Secondary | ICD-10-CM | POA: Insufficient documentation

## 2022-04-29 DIAGNOSIS — O09292 Supervision of pregnancy with other poor reproductive or obstetric history, second trimester: Secondary | ICD-10-CM

## 2022-04-29 DIAGNOSIS — O99512 Diseases of the respiratory system complicating pregnancy, second trimester: Secondary | ICD-10-CM | POA: Diagnosis present

## 2022-04-29 DIAGNOSIS — O09529 Supervision of elderly multigravida, unspecified trimester: Secondary | ICD-10-CM | POA: Diagnosis present

## 2022-04-29 DIAGNOSIS — O4440 Low lying placenta NOS or without hemorrhage, unspecified trimester: Secondary | ICD-10-CM

## 2022-04-29 DIAGNOSIS — Z8679 Personal history of other diseases of the circulatory system: Secondary | ICD-10-CM

## 2022-04-29 NOTE — Progress Notes (Signed)
Patient information  Patient Name: Sarah Reese  Patient MRN:   [id number]  Referring practice: MFM Referring Provider: [facility])  Medical/Obstetric History   Past pregnancies OB History  Gravida Para Term Preterm AB Living  '5 2 2 '$ 0 2 2  SAB IAB Ectopic Multiple Live Births  2 0 0 0 2    # Outcome Date GA Lbr Len/2nd Weight Sex Delivery Anes PTL Lv  5 Current           4 SAB 10/26/21 [redacted]w[redacted]d   SAB     3 Term 06/19/16 349w1d8:58 / 00:02 8 lb 9.8 oz (3.906 kg) M Vag-Spont None  LIV     Birth Comments: facial bruising  2 Term 09/22/09 4073w0d lb 5 oz (3.771 kg)  Vag-Spont None  LIV     Birth Comments: wnl  1 SAB 09/2008 4w048w0d        Sarah Reese is a 36 y44. G5P2OQ:[id number] for ultrasound and consultation.   RE AMA: The patient had low risk cell free DNA screening despite multiple soft markers noted on today's ultrasound.  The nasal bone appears hypoplastic, there is an EIF in the ventricle as well as mild urinary tract dilation of the left kidney.  The remainder of the anatomic survey appears normal.  I discussed that in the presence of a normal cell free DNA aneuploidy screening the risk for Down syndrome and other trisomies is not increased.  However, I discussed the role of amniocentesis and offered this test after explaining the risks and the benefits.  The patient declined and is comfortable with her results of the ultrasound and the cell free DNA screening.  I also discussed the potential for fetal growth restriction in pregnancy is complicated by advanced maternal age as well as the need for future sonographic evaluation of the fetal weight.  Due to her ethnicity and age she would benefit from 81 mg42f aspirin for preeclampsia prophylaxis.  RE short interval pregnancy: There is a slight increased risk of preterm delivery and potentially growth abnormalities and short interval pregnancy.  The cervix looked normal today.   There is no indication for future cervical length evaluation.  RE asthma: Currently doing well without medications.  Approximately one third of patients with asthma will worsen during pregnancy well one third will remain the same and one third will improve.  The highest risk time for exacerbation is at the end of the second trimester in the beginning of the third trimester.  Most pregnancies do well with mild asthma.  RE cardiac murmur: Patient reports history of a cardiac murmur.  Cardiology follow-up was arranged in 2018 but I do not see where she visited a cardiologist.  A cardiology referral has been placed by her OB provider.  She currently is doing well without symptoms.  RE low-lying placenta: I discussed the low-lying placenta with the patient.  Since she is very early in the gestation this is likely to resolve.  She has not had any bleeding and I instructed her to monitor for any bleeding and let her provider know or go to the nearest emergency room if is heavy bleeding.  Review of Systems: A review of systems was performed and was negative except per HPI   Vitals and Physical Exam    04/29/2022   12:55 PM 04/06/2022   10:32 AM 03/22/2022   10:38 PM  Vitals with BMI  Weight  173 lbs 3 oz   BMI  99991111   Systolic Q000111Q 123XX123 123XX123  Diastolic 64 76 68  Pulse 92 86   Sitting comfortably on the sonogram table Nonlabored breathing Normal rate and rhythm Abdomen is nontender  Sonographic findings Single intrauterine pregnancy. Fetal cardiac activity:  Observed and appears normal. Presentation: Transverse, head to maternal left. The anatomic structures that were well seen appear normal without evidence of soft markers. Due to poor acoustic windows, the visualization some structures remain suboptimally seen. Fetal biometry shows the estimated fetal weight at the 52 percentile. Amniotic fluid volume: Within normal limits. MVP: 6.28 cm. Placenta: Anterior, low-lying, 1.5 cm from int  os.  Assessment 1. Multigravida of advanced maternal age in second trimester 2. Short interval between pregnancies affecting pregnancy, antepartum 3. Supervision of high risk pregnancy, antepartum 4. Cardiac murmur 5. Asthma affecting pregnancy in second trimeste 6. [redacted] weeks gestation of pregnancy 7.  Multiple soft markers: Hypoplastic nasal bone, urinary tract dilation type a 1, echogenic intracardiac focus 8.  Low-lying placenta without hemorrhage, second trimester Plan -Detailed ultrasound was done today.  I discussed the sonographic soft markers.  Amniocentesis was discussed and declined. -Aspirin 81 mg for preeclampsia prophylaxis -Follow-up with cardiology as scheduled -Follow-up anatomy and fetal growth in 4 to 6 weeks to assess the fetal kidney, overall growth and placental location -Serial growth ultrasounds starting around 28 weeks to monitor for fetal growth restriction -Antenatal testing to start around 32 weeks due to the increased risk of stillbirth and high risk pregnancy -Delivery timing pending clinical course -Continue routine prenatal care with referring OB provider  I spent 60 minutes reviewing the patients chart, including labs and images as well as counseling the patient about her medical conditions.  Sarah Reese  MFM, [place]   04/29/2022  2:03 PM   Future Appointments   Future Appointments  Date Time Provider [place]  05/04/2022  9:00 AM CENTERING PROVIDER [facility]  06/01/2022  9:00 AM CENTERING PROVIDER [facility]  06/28/2022  8:20 AM [facility]A LAB [facility]  06/29/2022  9:00 AM CENTERING PROVIDER [facility]  07/13/2022  9:00 AM CENTERING PROVIDER [facility]  07/27/2022  9:00 AM CENTERING PROVIDER [facility]  08/10/2022  9:00 AM CENTERING PROVIDER [facility]  08/24/2022  9:00 AM CENTERING PROVIDER [facility]  09/07/2022  9:00 AM CENTERING PROVIDER [facility]  09/21/2022  9:00 AM CENTERING PROVIDER [facility]

## 2022-05-01 LAB — AFP, SERUM, OPEN SPINA BIFIDA
AFP MoM: 1.54
AFP Value: 76.9 ng/mL
Gest. Age on Collection Date: 18.9 weeks
Maternal Age At EDD: 35.8 yr
OSBR Risk 1 IN: 4914
Test Results:: NEGATIVE
Weight: 173 [lb_av]

## 2022-05-02 ENCOUNTER — Other Ambulatory Visit: Payer: Self-pay | Admitting: Family Medicine

## 2022-05-02 ENCOUNTER — Encounter: Payer: Self-pay | Admitting: Family Medicine

## 2022-05-02 MED ORDER — ASPIRIN 81 MG PO CHEW: 81.0000 mg | CHEWABLE_TABLET | Freq: Every day | ORAL | 3 refills | Status: DC

## 2022-05-04 ENCOUNTER — Encounter: Payer: Self-pay | Admitting: Family Medicine

## 2022-05-04 ENCOUNTER — Encounter: Payer: Self-pay | Admitting: *Deleted

## 2022-05-04 NOTE — Progress Notes (Deleted)
   PRENATAL VISIT NOTE  Subjective:  Tanisia Jaci Reese is a 37 y.o. Q6S3419 at [redacted]w[redacted]d being seen today for ongoing prenatal care.  She is currently monitored for the following issues for this {Blank single:19197::"high-risk","low-risk"} pregnancy and has Cardiac murmur; Trigger finger of right hand; Carpal tunnel syndrome of right wrist; Scoliosis, or kyphoscoliosis, idiopathic; Supervision of high risk pregnancy, antepartum; AMA (advanced maternal age) multigravida 35+; Short interval between pregnancies affecting pregnancy, antepartum; Asthma affecting pregnancy in second trimester; and [redacted] weeks gestation of pregnancy on their problem list.  Patient reports {sx:14538}.   .  .   . Denies leaking of fluid.   The following portions of the patient's history were reviewed and updated as appropriate: allergies, current medications, past family history, past medical history, past social history, past surgical history and problem list.   Objective:  There were no vitals filed for this visit.  Fetal Status:           General:  Alert, oriented and cooperative. Patient is in no acute distress.  Skin: Skin is warm and dry. No rash noted.   Cardiovascular: Normal heart rate noted  Respiratory: Normal respiratory effort, no problems with respiration noted  Abdomen: Soft, gravid, appropriate for gestational age.        Pelvic: {Blank single:19197::"Cervical exam performed in the presence of a chaperone","Cervical exam deferred"}        Extremities: Normal range of motion.     Mental Status: Normal mood and affect. Normal behavior. Normal judgment and thought content.   Assessment and Plan:  Pregnancy: Q2I2979 at [redacted]w[redacted]d 1. Supervision of high risk pregnancy, antepartum ***  2. Short interval between pregnancies affecting pregnancy, antepartum ***  3. Cardiac murmur ***  4. Asthma affecting pregnancy in second trimester ***  5. Multigravida of advanced maternal age in second  trimester ***  {Blank single:19197::"Term","Preterm"} labor symptoms and general obstetric precautions including but not limited to vaginal bleeding, contractions, leaking of fluid and fetal movement were reviewed in detail with the patient. Please refer to After Visit Summary for other counseling recommendations.   No follow-ups on file.  Future Appointments  Date Time Provider [place]  05/04/2022  9:00 AM Caren Macadam, MD [facility]  06/01/2022  9:00 AM CENTERING PROVIDER [facility]  06/03/2022  3:30 PM WMC-M[facility] NURSE [facility]  06/03/2022  3:45 PM WMC-MFC US5 [facility]  06/28/2022  8:20 AM WMC-WOCA LAB [facility]  06/29/2022  9:00 AM CENTERING PROVIDER [facility]  07/13/2022  9:00 AM CENTERING PROVIDER [facility]  07/27/2022  9:00 AM CENTERING PROVIDER [facility]  08/10/2022  9:00 AM CENTERING PROVIDER [facility]  08/24/2022  9:00 AM CENTERING PROVIDER [facility]  09/07/2022  9:00 AM CENTERING PROVIDER [facility]  09/21/2022  9:00 AM CENTERING PROVIDER WMC-CWH [place]    Caren Macadam, MD

## 2022-05-05 ENCOUNTER — Encounter: Payer: Medicaid Other | Admitting: Advanced Practice Midwife

## 2022-06-01 ENCOUNTER — Other Ambulatory Visit: Payer: Self-pay | Admitting: *Deleted

## 2022-06-01 ENCOUNTER — Ambulatory Visit (INDEPENDENT_AMBULATORY_CARE_PROVIDER_SITE_OTHER): Payer: Medicaid Other | Admitting: Family Medicine

## 2022-06-01 ENCOUNTER — Encounter: Payer: Self-pay | Admitting: *Deleted

## 2022-06-01 ENCOUNTER — Encounter: Payer: Self-pay | Admitting: Family Medicine

## 2022-06-01 ENCOUNTER — Ambulatory Visit: Payer: Medicaid Other | Attending: Maternal & Fetal Medicine

## 2022-06-01 ENCOUNTER — Ambulatory Visit: Payer: Medicaid Other | Admitting: *Deleted

## 2022-06-01 VITALS — BP 127/81 | HR 106 | Wt 189.0 lb

## 2022-06-01 VITALS — BP 109/61 | HR 94

## 2022-06-01 DIAGNOSIS — O35EXX0 Maternal care for other (suspected) fetal abnormality and damage, fetal genitourinary anomalies, not applicable or unspecified: Secondary | ICD-10-CM | POA: Diagnosis present

## 2022-06-01 DIAGNOSIS — D649 Anemia, unspecified: Secondary | ICD-10-CM

## 2022-06-01 DIAGNOSIS — O09522 Supervision of elderly multigravida, second trimester: Secondary | ICD-10-CM

## 2022-06-01 DIAGNOSIS — O09899 Supervision of other high risk pregnancies, unspecified trimester: Secondary | ICD-10-CM

## 2022-06-01 DIAGNOSIS — Q758 Other specified congenital malformations of skull and face bones: Secondary | ICD-10-CM

## 2022-06-01 DIAGNOSIS — O4442 Low lying placenta NOS or without hemorrhage, second trimester: Secondary | ICD-10-CM

## 2022-06-01 DIAGNOSIS — O0992 Supervision of high risk pregnancy, unspecified, second trimester: Secondary | ICD-10-CM | POA: Diagnosis not present

## 2022-06-01 DIAGNOSIS — O4440 Low lying placenta NOS or without hemorrhage, unspecified trimester: Secondary | ICD-10-CM | POA: Diagnosis present

## 2022-06-01 DIAGNOSIS — O99012 Anemia complicating pregnancy, second trimester: Secondary | ICD-10-CM | POA: Diagnosis not present

## 2022-06-01 DIAGNOSIS — O283 Abnormal ultrasonic finding on antenatal screening of mother: Secondary | ICD-10-CM | POA: Insufficient documentation

## 2022-06-01 DIAGNOSIS — O0990 Supervision of high risk pregnancy, unspecified, unspecified trimester: Secondary | ICD-10-CM | POA: Insufficient documentation

## 2022-06-01 DIAGNOSIS — Z3A23 23 weeks gestation of pregnancy: Secondary | ICD-10-CM | POA: Diagnosis not present

## 2022-06-01 DIAGNOSIS — O99891 Other specified diseases and conditions complicating pregnancy: Secondary | ICD-10-CM | POA: Diagnosis not present

## 2022-06-01 DIAGNOSIS — O09892 Supervision of other high risk pregnancies, second trimester: Secondary | ICD-10-CM

## 2022-06-01 DIAGNOSIS — J45909 Unspecified asthma, uncomplicated: Secondary | ICD-10-CM

## 2022-06-01 DIAGNOSIS — O99512 Diseases of the respiratory system complicating pregnancy, second trimester: Secondary | ICD-10-CM

## 2022-06-01 DIAGNOSIS — R011 Cardiac murmur, unspecified: Secondary | ICD-10-CM

## 2022-06-01 DIAGNOSIS — J301 Allergic rhinitis due to pollen: Secondary | ICD-10-CM

## 2022-06-01 HISTORY — DX: Low lying placenta nos or without hemorrhage, second trimester: O44.42

## 2022-06-01 MED ORDER — FLUTICASONE PROPIONATE 50 MCG/ACT NA SUSP: 2.0000 | Freq: Every day | NASAL | 2 refills | Status: DC

## 2022-06-01 NOTE — Progress Notes (Signed)
Pregnancy risk screen form completed. Sarah Reese

## 2022-06-01 NOTE — Progress Notes (Addendum)
   PRENATAL VISIT NOTE  Subjective:  Sarah Reese is a 37 y.o. K8L2751 at [redacted]w[redacted]d being seen today for ongoing prenatal care.  She is currently monitored for the following issues for this low-risk pregnancy and has Cardiac murmur; Trigger finger of right hand; Carpal tunnel syndrome of right wrist; Scoliosis, or kyphoscoliosis, idiopathic; Supervision of high risk pregnancy, antepartum; AMA (advanced maternal age) multigravida 35+; Short interval between pregnancies affecting pregnancy, antepartum; and Asthma affecting pregnancy in second trimester on their problem list.  Patient reports no complaints.  Contractions: Not present. Vag. Bleeding: None.  Movement: Present. Denies leaking of fluid.   The following portions of the patient's history were reviewed and updated as appropriate: allergies, current medications, past family history, past medical history, past social history, past surgical history and problem list.   Objective:   Vitals:   06/01/22 1052  BP: 127/81  Pulse: (!) 106  Weight: 189 lb (85.7 kg)    Fetal Status: Fetal Heart Rate (bpm): 143 Fundal Height: 25 cm Movement: Present     General:  Alert, oriented and cooperative. Patient is in no acute distress.  Skin: Skin is warm and dry. No rash noted.   Cardiovascular: Normal heart rate noted  Respiratory: Normal respiratory effort, no problems with respiration noted  Abdomen: Soft, gravid, appropriate for gestational age.  Pain/Pressure: Absent     Pelvic: Cervical exam deferred        Extremities: Normal range of motion.     Mental Status: Normal mood and affect. Normal behavior. Normal judgment and thought content.   Assessment and Plan:  Pregnancy: Z0Y1749 at [redacted]w[redacted]d 1. Supervision of high risk pregnancy, antepartum  Centering Pregnancy, Session#3: Reviewed resources in [employer].   Facilitated discussion today:  Stress/Stress reduction and breastfeeding/infant nutrition Mindfulness activity - Body  Scan  Fundal height and FHR appropriate today unless noted otherwise in plan. Patient to continue group care.    - Having some seasonal allergy sx and asked about safe meds, sent list.  - fluticasone (FLONASE) 50 MCG/ACT nasal spray; Place 2 sprays into both nostrils daily.  Dispense: 30 g; Refill: 2  2. Short interval between pregnancies affecting pregnancy, antepartum  3. Multigravida of advanced maternal age in second trimester  4. Seasonal allergic rhinitis due to pollen Flonase  Preterm labor symptoms and general obstetric precautions including but not limited to vaginal bleeding, contractions, leaking of fluid and fetal movement were reviewed in detail with the patient. Please refer to After Visit Summary for other counseling recommendations.   Return in about 4 weeks (around 06/29/2022) for Centering.  Future Appointments  Date Time Provider Department Center  06/01/2022 12:30 PM [facility] NURSE [facility]  06/01/2022 12:45 PM WMC-MFC US6 WMC-MFCUS [facility]  06/28/2022  8:20 AM W[facility] LAB [facility]  06/29/2022  9:00 AM CENTERING PROVIDER [facility]  07/13/2022  9:00 AM CENTERING PROVIDER [facility]  07/27/2022  9:00 AM CENTERING PROVIDER [facility]  08/10/2022  9:00 AM CENTERING PROVIDER [facility]  08/24/2022  9:00 AM CENTERING PROVIDER [facility]  09/07/2022  9:00 AM CENTERING PROVIDER [facility]  09/21/2022  9:00 AM CENTERING PROVIDER WMC-CWH W[facility]    Federico Flake, MD

## 2022-06-01 NOTE — Patient Instructions (Signed)

## 2022-06-02 ENCOUNTER — Encounter: Payer: Self-pay | Admitting: Family Medicine

## 2022-06-03 ENCOUNTER — Ambulatory Visit: Payer: Medicaid Other

## 2022-06-28 ENCOUNTER — Other Ambulatory Visit: Payer: Medicaid Other

## 2022-06-29 ENCOUNTER — Encounter: Payer: Self-pay | Admitting: Family Medicine

## 2022-06-29 ENCOUNTER — Encounter: Payer: Medicaid Other | Admitting: Family Medicine

## 2022-06-29 ENCOUNTER — Other Ambulatory Visit: Payer: Medicaid Other

## 2022-06-29 NOTE — Progress Notes (Deleted)
   PRENATAL VISIT NOTE  Subjective:  Sarah Reese is a 37 y.o. Z6X0960 at [redacted]w[redacted]d being seen today for ongoing prenatal care.  She is currently monitored for the following issues for this {Blank single:19197::"high-risk","low-risk"} pregnancy and has Cardiac murmur; Trigger finger of right hand; Carpal tunnel syndrome of right wrist; Scoliosis, or kyphoscoliosis, idiopathic; Supervision of high risk pregnancy, antepartum; AMA (advanced maternal age) multigravida 35+; Short interval between pregnancies affecting pregnancy, antepartum; Asthma affecting pregnancy in second trimester; and Low lying placenta nos or without hemorrhage, second trimester on their problem list.  Patient reports {sx:14538}.   .  .   . Denies leaking of fluid.   The following portions of the patient's history were reviewed and updated as appropriate: allergies, current medications, past family history, past medical history, past social history, past surgical history and problem list.   Objective:  There were no vitals filed for this visit.  Fetal Status:           General:  Alert, oriented and cooperative. Patient is in no acute distress.  Skin: Skin is warm and dry. No rash noted.   Cardiovascular: Normal heart rate noted  Respiratory: Normal respiratory effort, no problems with respiration noted  Abdomen: Soft, gravid, appropriate for gestational age.        Pelvic: {Blank single:19197::"Cervical exam performed in the presence of a chaperone","Cervical exam deferred"}        Extremities: Normal range of motion.     Mental Status: Normal mood and affect. Normal behavior. Normal judgment and thought content.   Assessment and Plan:  Pregnancy: A5W0981 at [redacted]w[redacted]d 1. Multigravida of advanced maternal age in third trimester ***  2. Low lying placenta nos or without hemorrhage, second trimester ***  3. Supervision of high risk pregnancy, antepartum ***  {Blank single:19197::"Term","Preterm"} labor symptoms  and general obstetric precautions including but not limited to vaginal bleeding, contractions, leaking of fluid and fetal movement were reviewed in detail with the patient. Please refer to After Visit Summary for other counseling recommendations.   No follow-ups on file.  Future Appointments  Date Time Provider Department Center  06/29/2022  9:00 AM Federico Flake, MD [facility]  07/05/2022  8:50 AM WMC-W[facility]A LAB [facility]  07/05/2022  9:45 AM WMC-MFC NURSE [facility]  07/05/2022 10:00 AM WMC-MFC US1 [facility]  07/13/2022  9:00 AM CENTERING PROVIDER [facility]  07/27/2022  9:00 AM CENTERING PROVIDER [facility]  08/03/2022  2:30 PM WMC-M[facility] NURSE [facility]  08/03/2022  2:45 PM WMC-MFC US5 [facility]  08/10/2022  9:00 AM CENTERING PROVIDER [facility]  08/24/2022  9:00 AM CENTERING PROVIDER [facility]  09/07/2022  9:00 AM CENTERING PROVIDER [facility]  09/21/2022  9:00 AM CENTERING PROVIDER W[facility]-CWH WMC    Federico Flake, MD

## 2022-07-04 ENCOUNTER — Other Ambulatory Visit: Payer: Self-pay | Admitting: Lactation Services

## 2022-07-04 DIAGNOSIS — Q758 Other specified congenital malformations of skull and face bones: Secondary | ICD-10-CM | POA: Insufficient documentation

## 2022-07-04 DIAGNOSIS — O283 Abnormal ultrasonic finding on antenatal screening of mother: Secondary | ICD-10-CM | POA: Insufficient documentation

## 2022-07-05 ENCOUNTER — Ambulatory Visit: Payer: Medicaid Other | Admitting: *Deleted

## 2022-07-05 ENCOUNTER — Ambulatory Visit: Payer: Medicaid Other | Attending: Maternal & Fetal Medicine

## 2022-07-05 ENCOUNTER — Other Ambulatory Visit: Payer: Self-pay

## 2022-07-05 ENCOUNTER — Other Ambulatory Visit: Payer: Medicaid Other

## 2022-07-05 VITALS — BP 120/70 | HR 94

## 2022-07-05 DIAGNOSIS — O283 Abnormal ultrasonic finding on antenatal screening of mother: Secondary | ICD-10-CM | POA: Diagnosis present

## 2022-07-05 DIAGNOSIS — O4442 Low lying placenta NOS or without hemorrhage, second trimester: Secondary | ICD-10-CM | POA: Insufficient documentation

## 2022-07-05 DIAGNOSIS — O0990 Supervision of high risk pregnancy, unspecified, unspecified trimester: Secondary | ICD-10-CM | POA: Diagnosis present

## 2022-07-05 DIAGNOSIS — D649 Anemia, unspecified: Secondary | ICD-10-CM | POA: Diagnosis not present

## 2022-07-05 DIAGNOSIS — O99513 Diseases of the respiratory system complicating pregnancy, third trimester: Secondary | ICD-10-CM

## 2022-07-05 DIAGNOSIS — O09899 Supervision of other high risk pregnancies, unspecified trimester: Secondary | ICD-10-CM

## 2022-07-05 DIAGNOSIS — Q758 Other specified congenital malformations of skull and face bones: Secondary | ICD-10-CM

## 2022-07-05 DIAGNOSIS — J45909 Unspecified asthma, uncomplicated: Secondary | ICD-10-CM

## 2022-07-05 DIAGNOSIS — O09522 Supervision of elderly multigravida, second trimester: Secondary | ICD-10-CM

## 2022-07-05 DIAGNOSIS — O09523 Supervision of elderly multigravida, third trimester: Secondary | ICD-10-CM

## 2022-07-05 DIAGNOSIS — Z3A28 28 weeks gestation of pregnancy: Secondary | ICD-10-CM

## 2022-07-05 DIAGNOSIS — O99013 Anemia complicating pregnancy, third trimester: Secondary | ICD-10-CM | POA: Diagnosis not present

## 2022-07-05 DIAGNOSIS — O4443 Low lying placenta NOS or without hemorrhage, third trimester: Secondary | ICD-10-CM | POA: Diagnosis not present

## 2022-07-12 ENCOUNTER — Other Ambulatory Visit: Payer: Medicaid Other

## 2022-07-13 ENCOUNTER — Encounter: Payer: Self-pay | Admitting: *Deleted

## 2022-07-13 ENCOUNTER — Other Ambulatory Visit: Payer: Self-pay

## 2022-07-13 ENCOUNTER — Ambulatory Visit: Payer: Medicaid Other

## 2022-07-13 ENCOUNTER — Other Ambulatory Visit: Payer: Medicaid Other

## 2022-07-13 VITALS — BP 114/71 | HR 91 | Wt 194.0 lb

## 2022-07-13 DIAGNOSIS — O0990 Supervision of high risk pregnancy, unspecified, unspecified trimester: Secondary | ICD-10-CM

## 2022-07-13 DIAGNOSIS — O0993 Supervision of high risk pregnancy, unspecified, third trimester: Secondary | ICD-10-CM | POA: Diagnosis not present

## 2022-07-13 DIAGNOSIS — O99513 Diseases of the respiratory system complicating pregnancy, third trimester: Secondary | ICD-10-CM

## 2022-07-13 DIAGNOSIS — O09523 Supervision of elderly multigravida, third trimester: Secondary | ICD-10-CM

## 2022-07-13 DIAGNOSIS — O09893 Supervision of other high risk pregnancies, third trimester: Secondary | ICD-10-CM

## 2022-07-13 DIAGNOSIS — O4442 Low lying placenta NOS or without hemorrhage, second trimester: Secondary | ICD-10-CM

## 2022-07-13 DIAGNOSIS — J45909 Unspecified asthma, uncomplicated: Secondary | ICD-10-CM

## 2022-07-13 DIAGNOSIS — O99013 Anemia complicating pregnancy, third trimester: Secondary | ICD-10-CM

## 2022-07-13 DIAGNOSIS — O09899 Supervision of other high risk pregnancies, unspecified trimester: Secondary | ICD-10-CM

## 2022-07-13 DIAGNOSIS — Z3A29 29 weeks gestation of pregnancy: Secondary | ICD-10-CM

## 2022-07-13 DIAGNOSIS — Q758 Other specified congenital malformations of skull and face bones: Secondary | ICD-10-CM

## 2022-07-13 DIAGNOSIS — O4443 Low lying placenta NOS or without hemorrhage, third trimester: Secondary | ICD-10-CM

## 2022-07-13 NOTE — Progress Notes (Signed)
   PRENATAL VISIT NOTE: Centering Group 11, Session 5  Subjective:  Sarah Reese is a 37 y.o. [id number] at [redacted]w[redacted]d being seen today for ongoing prenatal care.  She is currently monitored for the following issues for this high-risk pregnancy and has Cardiac murmur; Supervision of high risk pregnancy, antepartum; AMA (advanced maternal age) multigravida 35+; Short interval between pregnancies affecting pregnancy, antepartum; Asthma affecting pregnancy in second trimester; Low lying placenta nos or without hemorrhage, second trimester; Echogenic intracardiac focus of fetus on prenatal ultrasound; and Congenital hypoplasia of nasal bone on their problem list.  Patient reports no complaints.  Contractions: Irregular. Vag. Bleeding: None.  Movement: Present. Denies leaking of fluid.   The following portions of the patient's history were reviewed and updated as appropriate: allergies, current medications, past family history, past medical history, past social history, past surgical history and problem list.   Objective:   Vitals:   07/13/22 1140  BP: 114/71  Pulse: 91  Weight: 194 lb (88 kg)    Fetal Status: Fetal Heart Rate (bpm): 135 Fundal Height: 30 cm Movement: Present     General:  Alert, oriented and cooperative. Patient is in no acute distress.  Skin: Skin is warm and dry. No rash noted.   Cardiovascular: Normal heart rate noted  Respiratory: Normal respiratory effort, no problems with respiration noted  Abdomen: Soft, gravid, appropriate for gestational age.  Pain/Pressure: Absent     Pelvic: Cervical exam deferred        Extremities: Normal range of motion.     Mental Status: Normal mood and affect. Normal behavior. Normal judgment and thought content.   Assessment and Plan:  Pregnancy: [id number] at [redacted]w[redacted]d 1. Asthma affecting pregnancy in second trimester No concern   2. Congenital hypoplasia of nasal bone Otherwise WNL  3. Multigravida of advanced maternal age in third  trimester NIPs neg  4. Low lying placenta nos or without hemorrhage, second trimester Resolved  5. Short interval between pregnancies affecting pregnancy, antepartum  6. Supervision of high risk pregnancy, antepartum  Centering Pregnancy, Session#5: Reviewed resources in [employer].   Facilitated discussion today:  Stages of Labor, Sign of labor, labor positions, coping strategies.  1 hour of childbirth education provided  Fundal height and FHR appropriate today unless noted otherwise in plan. Patient to continue group care.    Preterm labor symptoms and general obstetric precautions including but not limited to vaginal bleeding, contractions, leaking of fluid and fetal movement were reviewed in detail with the patient. Please refer to After Visit Summary for other counseling recommendations.   No follow-ups on file.  Future Appointments  Date Time Provider Department Center  07/27/2022  9:00 AM CENTERING PROVIDER [facility]  08/03/2022  8:55 AM Federico Flake, MD [facility]  08/03/2022  2:30 PM [facility] NURSE [facility]  08/03/2022  2:45 PM [facility] US5 [facility]  08/10/2022  9:00 AM CENTERING PROVIDER [facility]  08/24/2022  9:00 AM CENTERING PROVIDER [facility]  09/07/2022  9:00 AM CENTERING PROVIDER [facility]  09/21/2022  9:00 AM CENTERING PROVIDER [facility]    Federico Flake, MD

## 2022-07-13 NOTE — Progress Notes (Signed)
Pregnancy risk screening form completed. Shaine Mount,RN 

## 2022-07-14 ENCOUNTER — Telehealth: Payer: Self-pay

## 2022-07-14 ENCOUNTER — Telehealth: Payer: Self-pay | Admitting: General Practice

## 2022-07-14 LAB — RPR: RPR Ser Ql: NONREACTIVE

## 2022-07-14 LAB — CBC
Hematocrit: 27 % — ABNORMAL LOW (ref 34.0–46.6)
Hemoglobin: 8.9 g/dL — ABNORMAL LOW (ref 11.1–15.9)
MCH: 29.2 pg (ref 26.6–33.0)
MCHC: 33 g/dL (ref 31.5–35.7)
MCV: 89 fL (ref 79–97)
Platelets: 168 10*3/uL (ref 150–450)
RBC: 3.05 x10E6/uL — ABNORMAL LOW (ref 3.77–5.28)
RDW: 12.6 % (ref 11.7–15.4)
WBC: 6.3 10*3/uL (ref 3.4–10.8)

## 2022-07-14 LAB — GLUCOSE TOLERANCE, 2 HOURS W/ 1HR
Glucose, 1 hour: 90 mg/dL (ref 70–179)
Glucose, 2 hour: 92 mg/dL (ref 70–152)
Glucose, Fasting: 86 mg/dL (ref 70–91)

## 2022-07-14 LAB — HIV ANTIBODY (ROUTINE TESTING W REFLEX): HIV Screen 4th Generation wRfx: NONREACTIVE

## 2022-07-14 NOTE — Telephone Encounter (Signed)
-----   Message from Federico Flake, MD sent at 07/14/2022 11:32 AM EDT ----- Anemia present-- recommend IV Fe. If patient desires please let me know and I will put in orders.

## 2022-07-14 NOTE — Telephone Encounter (Signed)
Called patient regarding IV iron infusions and asked patient how she feels about that. Patient states she hasn't had that done before and is a little nervous. Patient asked if the reason is because her levels are so low that oral iron couldn't bring it up enough. Told patient yes that is correct. Patient states she has had IV infusions before for dehydration and asked if it is similar. Told patient yes, just with iron. Patient verbalized understanding and is okay with proceeding with infusions. She asked if they could be done in [place] since that is where she lives. Scheduled IV iron infusion at same day surgery at [facility] for 5/29 @ 9am. Informed patient of appt. Patient verbalized understanding.

## 2022-07-14 NOTE — Telephone Encounter (Signed)
Called patient at number listed in chart--[phone]--identified patient with name and DOB. Relayed message from Dr. Alvester Morin in regards to results from CBC showing anemia. Patient asked for clarification regarding the need for an IV iron infusion--explained to patient how in pregnancy, we do often see anemia and low hemoglobin.   Patient then added/merged her partner into the call; explained results and necessity behind the IV iron infusion to partner with verbal permission from patient. Patient's partner had questions as to if this is something that patient would need to continue getting 3 transfusions a week until delivery; explained to partner and patient that we would start with that, and eventually likely redraw blood to evaluate the next steps and whether IV infusion is helping. Partner then asked if patient's habit of eating ice is causing her to be anemic, and I explained that often eating/craving ice is actually a symptom of anemia and not something that causes the anemia.   Patient and partner had no other question and would like to move forward with IV Fe infusions. Patient did inquire if we have an infusion center that we can refer her to in [place], [place] instead of [place].   Maureen Ralphs RN on 07/14/22 at [phone]

## 2022-07-19 ENCOUNTER — Telehealth: Payer: Self-pay | Admitting: Licensed Clinical Social Worker

## 2022-07-19 MED ORDER — SODIUM CHLORIDE 0.9 % IV SOLN
300.0000 mg | Freq: Once | INTRAVENOUS | Status: DC
  Filled 2022-07-19: qty 15

## 2022-07-19 NOTE — Telephone Encounter (Signed)
-----   Message from Ellison Carwin sent at 07/19/2022  1:39 PM EDT ----- Regarding: Mh referral Hello again.. Would you please f/u with this member at your earliest convenience. She reports some anxiety which is keeping her from sleeping well.   Thanks,

## 2022-07-20 ENCOUNTER — Ambulatory Visit
Admission: RE | Admit: 2022-07-20 | Discharge: 2022-07-20 | Disposition: A | Discharge status: Home / Self Care | Payer: Medicaid Other | Source: Ambulatory Visit | Attending: Family Medicine | Admitting: Family Medicine

## 2022-07-20 ENCOUNTER — Encounter: Payer: Self-pay | Admitting: Family Medicine

## 2022-07-20 VITALS — BP 116/70 | HR 86 | Temp 98.2°F | Resp 17

## 2022-07-20 DIAGNOSIS — O99013 Anemia complicating pregnancy, third trimester: Secondary | ICD-10-CM | POA: Diagnosis not present

## 2022-07-20 DIAGNOSIS — Z3A00 Weeks of gestation of pregnancy not specified: Secondary | ICD-10-CM | POA: Insufficient documentation

## 2022-07-20 DIAGNOSIS — O99891 Other specified diseases and conditions complicating pregnancy: Secondary | ICD-10-CM

## 2022-07-20 MED ORDER — SODIUM CHLORIDE 0.9 % IV SOLN
300.0000 mg | INTRAVENOUS | Status: DC
  Administered 2022-07-20: 300 mg via INTRAVENOUS
  Filled 2022-07-20: qty 300

## 2022-07-21 MED ORDER — CYCLOBENZAPRINE HCL 10 MG PO TABS: 10.0000 mg | ORAL_TABLET | Freq: Three times a day (TID) | ORAL | 1 refills | Status: DC | PRN

## 2022-07-26 DIAGNOSIS — O99013 Anemia complicating pregnancy, third trimester: Secondary | ICD-10-CM | POA: Insufficient documentation

## 2022-07-27 ENCOUNTER — Ambulatory Visit: Payer: Medicaid Other

## 2022-07-27 ENCOUNTER — Ambulatory Visit: Payer: Medicaid Other | Admitting: Licensed Clinical Social Worker

## 2022-07-27 DIAGNOSIS — O09523 Supervision of elderly multigravida, third trimester: Secondary | ICD-10-CM

## 2022-07-27 DIAGNOSIS — Z3A31 31 weeks gestation of pregnancy: Secondary | ICD-10-CM

## 2022-07-27 DIAGNOSIS — O4443 Low lying placenta NOS or without hemorrhage, third trimester: Secondary | ICD-10-CM

## 2022-07-27 DIAGNOSIS — O99013 Anemia complicating pregnancy, third trimester: Secondary | ICD-10-CM

## 2022-07-27 DIAGNOSIS — O09893 Supervision of other high risk pregnancies, third trimester: Secondary | ICD-10-CM

## 2022-07-27 DIAGNOSIS — O0993 Supervision of high risk pregnancy, unspecified, third trimester: Secondary | ICD-10-CM

## 2022-07-27 NOTE — Progress Notes (Unsigned)
   PRENATAL VISIT NOTE- Centering Pregnancy Group 11 Session 6  Subjective:  Sarah Reese is a 37 y.o. [id number] at [redacted]w[redacted]d being seen today for ongoing prenatal care.  She is currently monitored for the following issues for this high-risk pregnancy and has Cardiac murmur; Supervision of high risk pregnancy, antepartum; AMA (advanced maternal age) multigravida 35+; Short interval between pregnancies affecting pregnancy, antepartum; Asthma affecting pregnancy in second trimester; Low lying placenta nos or without hemorrhage, second trimester; Echogenic intracardiac focus of fetus on prenatal ultrasound; Congenital hypoplasia of nasal bone; and Anemia affecting pregnancy in third trimester on their problem list.  Patient reports no complaints.   .  .   . Denies leaking of fluid.   The following portions of the patient's history were reviewed and updated as appropriate: allergies, current medications, past family history, past medical history, past social history, past surgical history and problem list.   Objective:  There were no vitals filed for this visit.  Fetal Status:           General:  Alert, oriented and cooperative. Patient is in no acute distress.  Skin: Skin is warm and dry. No rash noted.   Cardiovascular: Normal heart rate noted  Respiratory: Normal respiratory effort, no problems with respiration noted  Abdomen: Soft, gravid, appropriate for gestational age.        Pelvic: Cervical exam deferred        Extremities: Normal range of motion.     Mental Status: Normal mood and affect. Normal behavior. Normal judgment and thought content.   Assessment and Plan:  Pregnancy: [id number] at [redacted]w[redacted]d 1. Multigravida of advanced maternal age in third trimester LR  2. Anemia affecting pregnancy in third trimester Lab Results  Component Value Date   HGB 8.9 (L) 07/13/2022   HGB 10.4 (L) 03/17/2022   HGB 11.5 (L) 02/02/2022  - Scheduled for IV Fe - Repeat HGB 3 weeks after  infusions - Continue oral Fe  3. Low lying placenta nos or without hemorrhage, second trimester Resolved 5/14  4. Short interval between pregnancies affecting pregnancy, antepartum  5. Supervision of high risk pregnancy, antepartum  Centering Pregnancy, Session#6: Reviewed resources in [employer].  Facilitated discussion today: family planning/reproductive life planning, coping strategies   Mindfulness activity with positive affirmations   Fundal height and FHR appropriate today unless noted otherwise in plan. Patient to continue group care.    Preterm labor symptoms and general obstetric precautions including but not limited to vaginal bleeding, contractions, leaking of fluid and fetal movement were reviewed in detail with the patient. Please refer to After Visit Summary for other counseling recommendations.   No follow-ups on file.  Future Appointments  Date Time Provider Department Center  07/27/2022  9:00 AM CENTERING PROVIDER [facility]  07/27/2022  4:00 PM Kathreen Cosier, LCSW [facility] None  08/03/2022  8:35 AM Bernerd Limbo, CNM [facility]  08/03/2022  2:30 PM [facility] NURSE [facility]  08/03/2022  2:45 PM [facility] US5 [facility]  08/10/2022  9:00 AM CENTERING PROVIDER [facility]  08/24/2022  9:00 AM CENTERING PROVIDER [facility]  08/31/2022  9:00 AM CENTERING PROVIDER [facility]  09/21/2022  9:00 AM CENTERING PROVIDER [facility]    Federico Flake, MD

## 2022-07-29 ENCOUNTER — Encounter: Payer: Medicaid Other | Admitting: Family Medicine

## 2022-08-01 ENCOUNTER — Telehealth: Payer: Self-pay | Admitting: Family Medicine

## 2022-08-01 NOTE — Telephone Encounter (Signed)
Patient would like an infusion scheduled for Wednesday morning

## 2022-08-03 ENCOUNTER — Ambulatory Visit: Payer: Medicaid Other | Admitting: *Deleted

## 2022-08-03 ENCOUNTER — Other Ambulatory Visit: Payer: Self-pay | Admitting: *Deleted

## 2022-08-03 ENCOUNTER — Ambulatory Visit: Payer: Medicaid Other | Attending: Maternal & Fetal Medicine

## 2022-08-03 ENCOUNTER — Encounter: Payer: Medicaid Other | Admitting: Certified Nurse Midwife

## 2022-08-03 VITALS — BP 125/66 | HR 105

## 2022-08-03 DIAGNOSIS — O4442 Low lying placenta NOS or without hemorrhage, second trimester: Secondary | ICD-10-CM

## 2022-08-03 DIAGNOSIS — O0990 Supervision of high risk pregnancy, unspecified, unspecified trimester: Secondary | ICD-10-CM | POA: Insufficient documentation

## 2022-08-03 DIAGNOSIS — Q758 Other specified congenital malformations of skull and face bones: Secondary | ICD-10-CM | POA: Insufficient documentation

## 2022-08-03 DIAGNOSIS — Z3A32 32 weeks gestation of pregnancy: Secondary | ICD-10-CM

## 2022-08-03 DIAGNOSIS — O09522 Supervision of elderly multigravida, second trimester: Secondary | ICD-10-CM | POA: Insufficient documentation

## 2022-08-03 DIAGNOSIS — O09523 Supervision of elderly multigravida, third trimester: Secondary | ICD-10-CM

## 2022-08-03 DIAGNOSIS — J45909 Unspecified asthma, uncomplicated: Secondary | ICD-10-CM

## 2022-08-03 DIAGNOSIS — O99013 Anemia complicating pregnancy, third trimester: Secondary | ICD-10-CM | POA: Diagnosis not present

## 2022-08-03 DIAGNOSIS — O09899 Supervision of other high risk pregnancies, unspecified trimester: Secondary | ICD-10-CM

## 2022-08-03 DIAGNOSIS — O283 Abnormal ultrasonic finding on antenatal screening of mother: Secondary | ICD-10-CM | POA: Diagnosis present

## 2022-08-03 DIAGNOSIS — D649 Anemia, unspecified: Secondary | ICD-10-CM | POA: Diagnosis not present

## 2022-08-03 DIAGNOSIS — O99513 Diseases of the respiratory system complicating pregnancy, third trimester: Secondary | ICD-10-CM | POA: Diagnosis not present

## 2022-08-04 ENCOUNTER — Ambulatory Visit
Admission: RE | Admit: 2022-08-04 | Discharge: 2022-08-04 | Disposition: A | Discharge status: Home / Self Care | Payer: Medicaid Other | Source: Ambulatory Visit | Attending: Family Medicine | Admitting: Family Medicine

## 2022-08-04 DIAGNOSIS — Z3A32 32 weeks gestation of pregnancy: Secondary | ICD-10-CM | POA: Insufficient documentation

## 2022-08-04 DIAGNOSIS — O99013 Anemia complicating pregnancy, third trimester: Secondary | ICD-10-CM | POA: Diagnosis not present

## 2022-08-04 MED ORDER — SODIUM CHLORIDE 0.9 % IV SOLN
300.0000 mg | Freq: Once | INTRAVENOUS | Status: AC
  Administered 2022-08-04: 300 mg via INTRAVENOUS
  Filled 2022-08-04: qty 300

## 2022-08-04 NOTE — Discharge Instructions (Signed)
Call [phone] to set up your infusion for next week.  Keep all other scheduled appointments and reach out to your physician with any concerns.

## 2022-08-09 NOTE — Telephone Encounter (Signed)
Per chart review patient completed Venofer infusion with [facility] on Thursday, 08/04/22.

## 2022-08-10 ENCOUNTER — Encounter: Payer: Self-pay | Admitting: Family Medicine

## 2022-08-10 ENCOUNTER — Ambulatory Visit: Payer: Medicaid Other | Admitting: Licensed Clinical Social Worker

## 2022-08-10 ENCOUNTER — Encounter: Payer: Self-pay | Admitting: *Deleted

## 2022-08-10 ENCOUNTER — Encounter: Payer: Medicaid Other | Admitting: Family Medicine

## 2022-08-10 DIAGNOSIS — F4322 Adjustment disorder with anxiety: Secondary | ICD-10-CM

## 2022-08-10 NOTE — Progress Notes (Deleted)
   PRENATAL VISIT NOTE  Subjective:  Sarah Reese is a 37 y.o. [id number] at [redacted]w[redacted]d being seen today for ongoing prenatal care.  She is currently monitored for the following issues for this high-risk pregnancy and has Cardiac murmur; Supervision of high risk pregnancy, antepartum; AMA (advanced maternal age) multigravida 35+; Short interval between pregnancies affecting pregnancy, antepartum; Asthma affecting pregnancy in second trimester; Echogenic intracardiac focus of fetus on prenatal ultrasound; Congenital hypoplasia of nasal bone; and Anemia affecting pregnancy in third trimester on their problem list.  Patient reports no complaints.   .  .   . Denies leaking of fluid.   The following portions of the patient's history were reviewed and updated as appropriate: allergies, current medications, past family history, past medical history, past social history, past surgical history and problem list.   Objective:  There were no vitals filed for this visit.  Fetal Status:           General:  Alert, oriented and cooperative. Patient is in no acute distress.  Skin: Skin is warm and dry. No rash noted.   Cardiovascular: Normal heart rate noted  Respiratory: Normal respiratory effort, no problems with respiration noted  Abdomen: Soft, gravid, appropriate for gestational age.        Pelvic: Cervical exam deferred        Extremities: Normal range of motion.     Mental Status: Normal mood and affect. Normal behavior. Normal judgment and thought content.   Assessment and Plan:  Pregnancy: [id number] at [redacted]w[redacted]d 1. Asthma affecting pregnancy in second trimester ***  2. Anemia affecting pregnancy in third trimester Lab Results  Component Value Date   HGB 8.9 (L) 07/13/2022   HGB 10.4 (L) 03/17/2022   HGB 11.5 (L) 02/02/2022  Referred for Fe infusions    3. Multigravida of advanced maternal age in third trimester ***  4. Supervision of high risk pregnancy, antepartum ***  {Blank  single:19197::"Term","Preterm"} labor symptoms and general obstetric precautions including but not limited to vaginal bleeding, contractions, leaking of fluid and fetal movement were reviewed in detail with the patient. Please refer to After Visit Summary for other counseling recommendations.   No follow-ups on file.  Future Appointments  Date Time Provider Department Center  08/10/2022  9:00 AM Federico Flake, MD [facility]  08/10/2022  2:50 PM Kathreen Cosier, LCSW [facility] None  08/24/2022  9:00 AM CENTERING PROVIDER [facility]  08/31/2022  9:00 AM CENTERING PROVIDER [facility]  09/02/2022  2:45 PM [facility] US5 [facility]  09/07/2022 10:55 AM Bernerd Limbo, CNM [facility]  09/21/2022  9:00 AM CENTERING PROVIDER [facility]    Federico Flake, MD
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
## 2022-08-11 DIAGNOSIS — F4322 Adjustment disorder with anxiety: Secondary | ICD-10-CM | POA: Insufficient documentation

## 2022-08-17 ENCOUNTER — Ambulatory Visit: Payer: Medicaid Other | Admitting: Licensed Clinical Social Worker

## 2022-08-17 DIAGNOSIS — F4322 Adjustment disorder with anxiety: Secondary | ICD-10-CM

## 2022-08-17 NOTE — Progress Notes (Signed)
Counselor/Therapist Progress Note  Patient ID: Daley Mooradian, MRN: [id number],    Date: 08/17/2022  Time Spent: 60 minutes    Treatment Type: Psychotherapy Patient's partner was present in session per patient's request   Reported Symptoms:  mild anxiety, worries   Mental Status Exam:  Appearance:   Casual and Well Groomed     Behavior:  Appropriate, Sharing, and Motivated  Motor:  Normal  Speech/Language:   Clear and Coherent and Normal Rate  Affect:  Appropriate  Mood:  normal  Thought process:  normal  Thought content:    WNL  Sensory/Perceptual disturbances:    WNL  Orientation:  oriented to person, place, time/date, situation, and day of week  Attention:  Good  Concentration:  Good  Memory:  WNL  Fund of knowledge:   Good  Insight:    Fair  Judgment:   Fair  Impulse Control:  Fair   Risk Assessment: Danger to Self:  No Self-injurious Behavior: No Danger to Others: No Duty to Warn:no Physical Aggression / Violence:No  Access to Firearms a concern: No  Gang Involvement:No   Subjective: Patient (and her partner) was engaged and cooperative throughout the session using time effectively to discuss relationship challenges. Patient was receptive to feedback and intervention from the LCSW and voices continued motivation for treatment and benefit from today's session with her partner.     Interventions: Assertiveness/Communication Checked in with patient and set session agenda with patient and her partner. LCSW explored patient and her partner's recent challenges within the relationship building understanding. LCSW provided tips on communication and fair fighting, highlighted the differing perspectives and wants / needs,  introduced the idea of parenting class or Triple P online modules, and encouraged both partners to examine how they want to support each other. Provided support through active listening, validation of feelings, and highlighted patient's strengths.     Diagnosis:   ICD-10-CM   1. Adjustment disorder with anxious mood  F43.22       Plan:  Patient's goal of treatment is to learn how to forgive people and to work on anger management.   --assess relationship challenges -build relationship  -Bringing closure to their past disputes -teach effective communication -explore attachment styles  -assist patient and her partner in developing understanding and support of each others needs -Repairing their relationship -Identifying the root causes of their disputes -Helping them understand their partner's point of view -Developing trust with each partner without alienating one or the other -Teaching them how their actions contribute to conflicts   Kathreen Cosier, LCSW

## 2022-08-24 ENCOUNTER — Ambulatory Visit: Payer: Medicaid Other | Admitting: Family Medicine

## 2022-08-24 ENCOUNTER — Ambulatory Visit: Payer: Medicaid Other | Admitting: Licensed Clinical Social Worker

## 2022-08-24 VITALS — BP 126/80 | HR 94 | Wt 208.0 lb

## 2022-08-24 DIAGNOSIS — O09899 Supervision of other high risk pregnancies, unspecified trimester: Secondary | ICD-10-CM

## 2022-08-24 DIAGNOSIS — O09523 Supervision of elderly multigravida, third trimester: Secondary | ICD-10-CM | POA: Diagnosis not present

## 2022-08-24 DIAGNOSIS — O0990 Supervision of high risk pregnancy, unspecified, unspecified trimester: Secondary | ICD-10-CM

## 2022-08-24 DIAGNOSIS — Z3A35 35 weeks gestation of pregnancy: Secondary | ICD-10-CM

## 2022-08-24 DIAGNOSIS — O09893 Supervision of other high risk pregnancies, third trimester: Secondary | ICD-10-CM

## 2022-08-24 DIAGNOSIS — O0993 Supervision of high risk pregnancy, unspecified, third trimester: Secondary | ICD-10-CM | POA: Diagnosis not present

## 2022-08-24 DIAGNOSIS — O99013 Anemia complicating pregnancy, third trimester: Secondary | ICD-10-CM | POA: Diagnosis not present

## 2022-08-24 DIAGNOSIS — F4322 Adjustment disorder with anxiety: Secondary | ICD-10-CM

## 2022-08-24 NOTE — Progress Notes (Signed)
   PRENATAL VISIT NOTE Centering Pregnancy Group 11, Session 8  Subjective:  Sarah Reese is a 37 y.o. [id number] at [redacted]w[redacted]d being seen today for ongoing prenatal care.  She is currently monitored for the following issues for this high-risk pregnancy and has Cardiac murmur; Supervision of high risk pregnancy, antepartum; AMA (advanced maternal age) multigravida 35+; Short interval between pregnancies affecting pregnancy, antepartum; Asthma affecting pregnancy in second trimester; Echogenic intracardiac focus of fetus on prenatal ultrasound; Congenital hypoplasia of nasal bone; Anemia affecting pregnancy in third trimester; and Adjustment disorder with anxious mood on their problem list.  Patient reports no complaints.  Contractions: Not present. Vag. Bleeding: None.  Movement: Present. Denies leaking of fluid.   The following portions of the patient's history were reviewed and updated as appropriate: allergies, current medications, past family history, past medical history, past social history, past surgical history and problem list.   Objective:  There were no vitals filed for this visit.  Fetal Status: Fetal Heart Rate (bpm): 135 Fundal Height: 36 cm Movement: Present  Presentation: Vertex  General:  Alert, oriented and cooperative. Patient is in no acute distress.  Skin: Skin is warm and dry. No rash noted.   Cardiovascular: Normal heart rate noted  Respiratory: Normal respiratory effort, no problems with respiration noted  Abdomen: Soft, gravid, appropriate for gestational age.  Pain/Pressure: Absent     Pelvic: Cervical exam deferred        Extremities: Normal range of motion.     Mental Status: Normal mood and affect. Normal behavior. Normal judgment and thought content.   Assessment and Plan:  Pregnancy: [id number] at [redacted]w[redacted]d 1. Supervision of high risk pregnancy, antepartum    Centering Pregnancy, Session#8: Reviewed resources in [employer].   Facilitated discussion  today:  Newborn safety, delivery planning, postpartum planning and follow other topics (family planning, breastfeeding)  Fundal height and FHR appropriate today unless noted otherwise in plan. Patient to continue group care.   Working with LCSW at [facility] about her anxiety and multiple other stressors.   Reports she is tired of pregnancy and possibly interested in IOL at 40wks electively but also patient thinks baby will come before that time.   Discussed GC/GBS self collection at next visit.   2. Short interval between pregnancies affecting pregnancy, antepartum  3. Multigravida of advanced maternal age in third trimester  4. Anemia affecting pregnancy in third trimester Lab Results  Component Value Date   HGB 8.9 (L) 07/13/2022   HGB 10.4 (L) 03/17/2022   HGB 11.5 (L) 02/02/2022  Patient is s/p two IV fe infusions(5/29 and 6/13), plans on third. Has the number to schedule Repeat CBC at delivery  Preterm labor symptoms and general obstetric precautions including but not limited to vaginal bleeding, contractions, leaking of fluid and fetal movement were reviewed in detail with the patient. Please refer to After Visit Summary for other counseling recommendations.   Return in about 1 week (around 08/31/2022) for Routine prenatal care, Centering.  Future Appointments  Date Time Provider Department Center  08/24/2022  2:00 PM Kathreen Cosier, [place] [facility] None  08/31/2022  9:00 AM CENTERING PROVIDER [facility]  09/02/2022  2:45 PM [facility] US5 [facility]  09/07/2022 10:55 AM Bernerd Limbo, CNM [facility]  09/21/2022  9:00 AM CENTERING PROVIDER [facility]    Federico Flake, MD

## 2022-08-24 NOTE — Progress Notes (Signed)
Counselor/Therapist Progress Note  Patient ID: Sarah Reese, MRN: [id number],    Date: 08/24/2022  Time Spent: 55 minutes    Treatment Type: Psychotherapy (partner present in session with patient's consent and request)   Reported Symptoms:  mild anxiety, irritability  Mental Status Exam:  Appearance:   Casual, Neat, and Well Groomed     Behavior:  Appropriate, Sharing, and Motivated  Motor:  Normal  Speech/Language:   Clear and Coherent and Normal Rate  Affect:  Appropriate, Congruent, and Full Range  Mood:  normal  Thought process:  normal  Thought content:    WNL  Sensory/Perceptual disturbances:    WNL  Orientation:  oriented to person, place, time/date, and situation  Attention:  Good  Concentration:  Good  Memory:  WNL  Fund of knowledge:   Good  Insight:    Fair  Judgment:   Fair  Impulse Control:  Fair   Risk Assessment: Danger to Self:  No Self-injurious Behavior: No Danger to Others: No Duty to Warn:no Physical Aggression / Violence:No  Access to Firearms a concern: No  Gang Involvement:No   Subjective: Patient (and partner) was receptive to feedback and intervention from LCSW and actively and effectively participated throughout the session. Patient and her partner report benefit from previous session and that things are improving. Patient is likely to benefit from future treatment because she remains motivated to decrease symptoms and improve functioning.     Interventions: Cognitive Behavioral Therapy, Interpersonal, and Client Centered  Checked in with patient (and her partner) regarding their week. Reviewed previous session regarding homework task-  stonewalling (blocking each other), and supporting one another with meeting their own needs. LCSW provided psychoeducation and brief therapeutic intervention regarding coping with past infidenlity. LCSW reviewed and modeled effictive communication, "I" messages, and the connection between thoughts, emotions  and behaviors. Provided support through active listening, validation of feelings, and highlighted patient's strengths.    Diagnosis:   ICD-10-CM   1. Adjustment disorder with anxious mood  F43.22      Plan: Patient's goal of treatment is to learn how to forgive people and to work on anger management.    --assess relationship challenges -build relationship  -Bringing closure to their past disputes -teach effective communication -explore attachment styles  -assist patient and her partner in developing understanding and support of each others needs -Repairing their relationship -Identifying the root causes of their disputes -Helping them understand their partner's point of view -Developing trust with each partner without alienating one or the other -Teaching them how their actions contribute to conflicts    Future Appointments  Date Time Provider Department Center  08/31/2022  9:00 AM CENTERING PROVIDER [facility]  09/01/2022  1:10 PM Kathreen Cosier, LCSW [facility] None  09/02/2022  2:45 PM [facility] US5 [facility]  09/07/2022 10:55 AM Bernerd Limbo, CNM [facility]  09/21/2022  9:00 AM CENTERING PROVIDER [facility]      Kathreen Cosier, LCSW

## 2022-08-31 ENCOUNTER — Encounter: Payer: Medicaid Other | Admitting: Family Medicine

## 2022-08-31 ENCOUNTER — Telehealth: Payer: Self-pay | Admitting: *Deleted

## 2022-08-31 NOTE — Progress Notes (Deleted)
   PRENATAL VISIT NOTE  Subjective:  Sarah Reese is a 37 y.o. [id number] at [redacted]w[redacted]d being seen today for ongoing prenatal care.  She is currently monitored for the following issues for this low-risk pregnancy and has Cardiac murmur; Supervision of high risk pregnancy, antepartum; AMA (advanced maternal age) multigravida 35+; Short interval between pregnancies affecting pregnancy, antepartum; Asthma affecting pregnancy in second trimester; Echogenic intracardiac focus of fetus on prenatal ultrasound; Congenital hypoplasia of nasal bone; Anemia affecting pregnancy in third trimester; and Adjustment disorder with anxious mood on their problem list.  Patient reports no complaints.   .  .   . Denies leaking of fluid.   The following portions of the patient's history were reviewed and updated as appropriate: allergies, current medications, past family history, past medical history, past social history, past surgical history and problem list.   Objective:  There were no vitals filed for this visit.  Fetal Status:           General:  Alert, oriented and cooperative. Patient is in no acute distress.  Skin: Skin is warm and dry. No rash noted.   Cardiovascular: Normal heart rate noted  Respiratory: Normal respiratory effort, no problems with respiration noted  Abdomen: Soft, gravid, appropriate for gestational age.        Pelvic: Cervical exam deferred        Extremities: Normal range of motion.     Mental Status: Normal mood and affect. Normal behavior. Normal judgment and thought content.   Assessment and Plan:  Pregnancy: [id number] at [redacted]w[redacted]d 1. Supervision of high risk pregnancy, antepartum ***  2. Anemia affecting pregnancy in third trimester ***  3. Asthma affecting pregnancy in second trimester ***  {Blank single:19197::"Term","Preterm"} labor symptoms and general obstetric precautions including but not limited to vaginal bleeding, contractions, leaking of fluid and fetal movement  were reviewed in detail with the patient. Please refer to After Visit Summary for other counseling recommendations.   No follow-ups on file.  Future Appointments  Date Time Provider Department Center  08/31/2022  9:00 AM Federico Flake, MD [facility]  09/01/2022  1:10 PM Kathreen Cosier, LCSW [facility] None  09/02/2022  2:45 PM [facility] US5 [facility]  09/07/2022 10:55 AM Bernerd Limbo, CNM [facility]  09/21/2022  9:00 AM CENTERING PROVIDER [facility]    Federico Flake, MD

## 2022-08-31 NOTE — Telephone Encounter (Signed)
Sarah Reese missed her prenatal visit today. She called to ask about her upcoming appointments and centering appointments. I explained today was last centering and from now on she will have weekly ob visits with Dr. Alvester Morin or other providers. She voices understanding. [name]

## 2022-09-01 ENCOUNTER — Ambulatory Visit: Payer: Medicaid Other | Admitting: Licensed Clinical Social Worker

## 2022-09-01 DIAGNOSIS — F4322 Adjustment disorder with anxiety: Secondary | ICD-10-CM

## 2022-09-01 NOTE — Progress Notes (Signed)
Counselor/Therapist Progress Note  Patient ID: Sarah Reese, MRN: [id number],    Date: 09/01/2022  Time Spent: 45 minutes    Treatment Type: Psychotherapy  Reported Symptoms:  mild anxiety, irritability secondary to relationship conflict  Mental Status Exam:  Appearance:   Casual and Neat     Behavior:  Appropriate, Sharing, and Motivated  Motor:  Normal  Speech/Language:   Clear and Coherent and Normal Rate  Affect:  Appropriate, Congruent, and Full Range  Mood:  normal  Thought process:  normal  Thought content:    WNL  Sensory/Perceptual disturbances:    WNL  Orientation:  oriented to person, place, time/date, situation, and day of week  Attention:  Good  Concentration:  Good  Memory:  WNL  Fund of knowledge:   Good  Insight:    Fair  Judgment:   Fair  Impulse Control:  Fair    Risk Assessment: Danger to Self:  No Self-injurious Behavior: No Danger to Others: No Duty to Warn:no Physical Aggression / Violence:No  Access to Firearms a concern: No  Gang Involvement:No   Subjective: Patient was engaged and cooperative throughout the session using time effectively to discuss thoughts and feelings. Patient reports mild stress due to relationship conflict since last session.  Patient was receptive to feedback and intervention from LCSW and remains motivated for treatment.   Interventions: Cognitive Behavioral Therapy and Client Centered  Established psychological safety. Checked in with patient regarding their week. Clinician met with patient to identify needs related to stressors and functioning, and assess and monitor mental health signs and symptoms of anxiety/depression, and assess safety. LCSW reviewed previous session regarding relationship challenges and offered reflective listening and compassionate presence.  LCSW explored patient's values related to relationships and discussed effective communication. Provided support through active listening, validation of  feelings, and highlighted patient's strengths.    Diagnosis:   ICD-10-CM   1. Adjustment disorder with anxious mood  F43.22      Plan: Patient's goal of treatment is to learn how to forgive people and to work on anger management.    --assess relationship challenges -build relationship  -Bringing closure to their past disputes -teach effective communication -explore attachment styles  -assist patient and her partner in developing understanding and support of each others needs -Repairing their relationship -Identifying the root causes of their disputes -Helping them understand their partner's point of view -Developing trust with each partner without alienating one or the other -Teaching them how their actions contribute to conflicts  Treatment Target: Understand the relationship between thoughts, emotions, and behaviors  Psychoeducation on CBTs model   Oriented the client to the therapeutic approach Teach the connection between thoughts, emotions, and behaviors  Treatment Target: Reducing vulnerability to "emotional mind" Values clarification   Self-care - nutrition, sleep, exercise  Treatment Target: Increase emotional regulation  Teach distress tolerance techniques - "what helps me"  Opposite action PLEASE  skills or self-care skills  Self-soothing  Cope ahead skills - imagery, rehearsal, problem-solving, exposure   Assertiveness communication   Future Appointments  Date Time Provider Department Center  09/02/2022  2:45 PM [facility] US5 [facility]  09/06/2022  3:00 PM Kathreen Cosier, LCSW [facility] None  09/07/2022 10:55 AM Bernerd Limbo, CNM [facility]  09/14/2022  3:00 PM Kathreen Cosier, LCSW [facility] None  09/21/2022  8:15 AM Federico Flake, MD [facility]  09/28/2022  8:55 AM Federico Flake, MD [facility]  09/28/2022  9:15 AM [facility] US2 [facility]  Kathreen Cosier, LCSW

## 2022-09-02 ENCOUNTER — Ambulatory Visit: Payer: Medicaid Other | Attending: Maternal & Fetal Medicine

## 2022-09-02 DIAGNOSIS — O09523 Supervision of elderly multigravida, third trimester: Secondary | ICD-10-CM

## 2022-09-02 DIAGNOSIS — O99013 Anemia complicating pregnancy, third trimester: Secondary | ICD-10-CM | POA: Diagnosis not present

## 2022-09-02 DIAGNOSIS — O99891 Other specified diseases and conditions complicating pregnancy: Secondary | ICD-10-CM

## 2022-09-02 DIAGNOSIS — Z3A37 37 weeks gestation of pregnancy: Secondary | ICD-10-CM

## 2022-09-02 DIAGNOSIS — J45909 Unspecified asthma, uncomplicated: Secondary | ICD-10-CM

## 2022-09-02 DIAGNOSIS — R011 Cardiac murmur, unspecified: Secondary | ICD-10-CM

## 2022-09-02 DIAGNOSIS — O99513 Diseases of the respiratory system complicating pregnancy, third trimester: Secondary | ICD-10-CM

## 2022-09-02 DIAGNOSIS — D649 Anemia, unspecified: Secondary | ICD-10-CM

## 2022-09-06 ENCOUNTER — Ambulatory Visit: Payer: Medicaid Other | Admitting: Licensed Clinical Social Worker

## 2022-09-06 DIAGNOSIS — F4322 Adjustment disorder with anxiety: Secondary | ICD-10-CM

## 2022-09-06 NOTE — Progress Notes (Signed)
Counselor/Therapist Progress Note  Patient ID: Sarah Reese, MRN: [id number],    Date: 09/06/2022  Time Spent: 46 minutes    Treatment Type: Psychotherapy  Reported Symptoms:  Anxiety, anxious thoughts, irritability secondary to relationship conflict   Mental Status Exam:  Appearance:   Casual and Neat     Behavior:  Appropriate, Sharing, and Motivated  Motor:  Normal  Speech/Language:   Clear and Coherent and Normal Rate  Affect:  Appropriate, Congruent, and Full Range  Mood:  normal  Thought process:  normal  Thought content:    WNL  Sensory/Perceptual disturbances:    WNL  Orientation:  oriented to person, place, time/date, situation, and day of week  Attention:  Good  Concentration:  Good  Memory:  WNL  Fund of knowledge:   Good  Insight:    Fair  Judgment:   Fair  Impulse Control:  Fair   Risk Assessment: Danger to Self:  No Self-injurious Behavior: No Danger to Others: No Duty to Warn:no Physical Aggression / Violence:No  Access to Firearms a concern: No  Gang Involvement:No   Subjective: Patient was receptive to feedback and intervention from LCSW and actively and effectively participated throughout the session discussing thoughts and feelings. Patient reports continued stress related to her current relationship. Patient is likely to benefit from future treatment because she remains motivated to decrease distress related to relationship challenges.   Interventions: Cognitive Behavioral Therapy, Motivational Interviewing, and Client Centered  Established psychological safety. Checked in with patient regarding their week. LCSW processed with the patient how they have been doing since the last follow-up session. LCSW assisted patient in processing their thoughts and emotions regarding relationship conflict with current partner.  LCSW offered reflective listening and compassionate presence, listened and highlighted ambivalence and attended to change talk;  reframed unhelpful thoughts and highlighting emotional responses. Provided support through active listening, validation of feelings, and highlighted patient's strengths.   Diagnosis:   ICD-10-CM   1. Adjustment disorder with anxious mood  F43.22      Plan: Patient's goal of treatment is to learn how to forgive people and to work on anger management.    --assess relationship challenges -build relationship  -Bringing closure to their past disputes -teach effective communication -explore attachment styles  -assist patient and her partner in developing understanding and support of each others needs -Repairing their relationship -Identifying the root causes of their disputes -Helping them understand their partner's point of view -Developing trust with each partner without alienating one or the other -Teaching them how their actions contribute to conflicts   Treatment Target: Understand the relationship between thoughts, emotions, and behaviors  Psychoeducation on CBTs model   Oriented the client to the therapeutic approach Teach the connection between thoughts, emotions, and behaviors  Treatment Target: Reducing vulnerability to "emotional mind" Values clarification   Self-care - nutrition, sleep, exercise  Treatment Target: Increase emotional regulation  Teach distress tolerance techniques - "what helps me"  Opposite action PLEASE  skills or self-care skills  Self-soothing  Cope ahead skills - imagery, rehearsal, problem-solving, exposure   Assertiveness communication   Future Appointments  Date Time Provider Department Center  09/07/2022 10:55 AM Bernerd Limbo, CNM [facility]  09/14/2022  3:00 PM Kathreen Cosier, LCSW [facility] None  09/21/2022  8:15 AM Federico Flake, MD [facility]  09/28/2022  8:55 AM Federico Flake, MD [facility]  09/28/2022  9:15 AM [facility] US2 [facility]   Kathreen Cosier, LCSW

## 2022-09-06 NOTE — Progress Notes (Unsigned)
   PRENATAL VISIT NOTE  Subjective:  Sarah Reese is a 37 y.o. [id number] at [redacted]w[redacted]d being seen today for ongoing prenatal care.  She is currently monitored for the following issues for this {Blank single:19197::"high-risk","low-risk"} pregnancy and has Cardiac murmur; Supervision of high-risk pregnancy; AMA (advanced maternal age) multigravida 35+; Short interval between pregnancies affecting pregnancy, antepartum; Asthma affecting pregnancy in second trimester; Echogenic intracardiac focus of fetus on prenatal ultrasound; Congenital hypoplasia of nasal bone; Anemia affecting pregnancy in third trimester; and Adjustment disorder with anxious mood on their problem list.  Patient reports {sx:14538}.   .  .   . Denies leaking of fluid.   The following portions of the patient's history were reviewed and updated as appropriate: allergies, current medications, past family history, past medical history, past social history, past surgical history and problem list.   Objective:  There were no vitals filed for this visit.  Fetal Status:           General:  Alert, oriented and cooperative. Patient is in no acute distress.  Skin: Skin is warm and dry. No rash noted.   Cardiovascular: Normal heart rate noted  Respiratory: Normal respiratory effort, no problems with respiration noted  Abdomen: Soft, gravid, appropriate for gestational age.        Pelvic: {Blank single:19197::"Cervical exam performed in the presence of a chaperone","Cervical exam deferred"}        Extremities: Normal range of motion.     Mental Status: Normal mood and affect. Normal behavior. Normal judgment and thought content.   Assessment and Plan:  Pregnancy: [id number] at [redacted]w[redacted]d 1. Supervision of high risk pregnancy in third trimester ***  2. [redacted] weeks gestation of pregnancy ***  3. Cardiac murmur ***  4. Multigravida of advanced maternal age in third trimester ***  5. Anemia affecting pregnancy in third  trimester ***  6. Adjustment disorder with anxious mood ***  {Blank single:19197::"Term","Preterm"} labor symptoms and general obstetric precautions including but not limited to vaginal bleeding, contractions, leaking of fluid and fetal movement were reviewed in detail with the patient. Please refer to After Visit Summary for other counseling recommendations.   No follow-ups on file.  Future Appointments  Date Time Provider Department Center  09/07/2022 10:55 AM Bernerd Limbo, CNM [facility]  09/14/2022  3:00 PM Kathreen Cosier, LCSW [facility] None  09/21/2022  8:15 AM Federico Flake, MD [facility]  09/28/2022  8:55 AM Federico Flake, MD [facility]  09/28/2022  9:15 AM [facility] US2 [facility]    Bernerd Limbo, CNM

## 2022-09-07 ENCOUNTER — Ambulatory Visit (INDEPENDENT_AMBULATORY_CARE_PROVIDER_SITE_OTHER): Payer: Medicaid Other | Admitting: Certified Nurse Midwife

## 2022-09-07 ENCOUNTER — Encounter: Payer: Self-pay | Admitting: Certified Nurse Midwife

## 2022-09-07 ENCOUNTER — Other Ambulatory Visit (HOSPITAL_COMMUNITY)
Admission: RE | Admit: 2022-09-07 | Discharge: 2022-09-07 | Disposition: A | Discharge status: Home / Self Care | Payer: Medicaid Other | Source: Ambulatory Visit | Attending: Certified Nurse Midwife | Admitting: Certified Nurse Midwife

## 2022-09-07 VITALS — BP 115/81 | HR 86 | Wt 207.1 lb

## 2022-09-07 DIAGNOSIS — Z3A37 37 weeks gestation of pregnancy: Secondary | ICD-10-CM | POA: Diagnosis not present

## 2022-09-07 DIAGNOSIS — O09893 Supervision of other high risk pregnancies, third trimester: Secondary | ICD-10-CM | POA: Insufficient documentation

## 2022-09-07 DIAGNOSIS — O0993 Supervision of high risk pregnancy, unspecified, third trimester: Secondary | ICD-10-CM

## 2022-09-07 DIAGNOSIS — F4322 Adjustment disorder with anxiety: Secondary | ICD-10-CM

## 2022-09-07 DIAGNOSIS — O09523 Supervision of elderly multigravida, third trimester: Secondary | ICD-10-CM

## 2022-09-07 DIAGNOSIS — R011 Cardiac murmur, unspecified: Secondary | ICD-10-CM

## 2022-09-07 DIAGNOSIS — O99013 Anemia complicating pregnancy, third trimester: Secondary | ICD-10-CM

## 2022-09-07 NOTE — Progress Notes (Signed)
Patient here for routine prenatal care  She informed me that she has been experiencing contractions. She explained the a single Cx last roughly "45 seconds" and hours would go by before another Cx would come. Sarah Reese denies any LOF or VB.

## 2022-09-08 LAB — CERVICOVAGINAL ANCILLARY ONLY
Chlamydia: NEGATIVE
Comment: NEGATIVE
Comment: NORMAL
Neisseria Gonorrhea: NEGATIVE

## 2022-09-09 ENCOUNTER — Inpatient Hospital Stay (HOSPITAL_COMMUNITY)
Admission: AD | Admit: 2022-09-09 | Discharge: 2022-09-12 | DRG: 807 | Disposition: A | Discharge status: Home / Self Care | Payer: Medicaid Other | Attending: Obstetrics and Gynecology | Admitting: Obstetrics and Gynecology

## 2022-09-09 ENCOUNTER — Encounter (HOSPITAL_COMMUNITY): Payer: Self-pay | Admitting: Obstetrics and Gynecology

## 2022-09-09 DIAGNOSIS — Z3A38 38 weeks gestation of pregnancy: Secondary | ICD-10-CM

## 2022-09-09 DIAGNOSIS — O283 Abnormal ultrasonic finding on antenatal screening of mother: Secondary | ICD-10-CM | POA: Diagnosis present

## 2022-09-09 DIAGNOSIS — J45909 Unspecified asthma, uncomplicated: Secondary | ICD-10-CM | POA: Diagnosis present

## 2022-09-09 DIAGNOSIS — Z5982 Transportation insecurity: Secondary | ICD-10-CM

## 2022-09-09 DIAGNOSIS — Z7982 Long term (current) use of aspirin: Secondary | ICD-10-CM

## 2022-09-09 DIAGNOSIS — Z5986 Financial insecurity: Secondary | ICD-10-CM

## 2022-09-09 DIAGNOSIS — O09899 Supervision of other high risk pregnancies, unspecified trimester: Secondary | ICD-10-CM

## 2022-09-09 DIAGNOSIS — O9952 Diseases of the respiratory system complicating childbirth: Secondary | ICD-10-CM | POA: Diagnosis present

## 2022-09-09 DIAGNOSIS — O0993 Supervision of high risk pregnancy, unspecified, third trimester: Principal | ICD-10-CM

## 2022-09-09 DIAGNOSIS — O99013 Anemia complicating pregnancy, third trimester: Secondary | ICD-10-CM | POA: Diagnosis present

## 2022-09-09 DIAGNOSIS — O358XX0 Maternal care for other (suspected) fetal abnormality and damage, not applicable or unspecified: Secondary | ICD-10-CM | POA: Diagnosis present

## 2022-09-09 DIAGNOSIS — O0990 Supervision of high risk pregnancy, unspecified, unspecified trimester: Secondary | ICD-10-CM

## 2022-09-09 DIAGNOSIS — O09523 Supervision of elderly multigravida, third trimester: Secondary | ICD-10-CM

## 2022-09-09 DIAGNOSIS — Q758 Other specified congenital malformations of skull and face bones: Secondary | ICD-10-CM

## 2022-09-09 DIAGNOSIS — O9902 Anemia complicating childbirth: Secondary | ICD-10-CM | POA: Diagnosis present

## 2022-09-09 DIAGNOSIS — O09529 Supervision of elderly multigravida, unspecified trimester: Secondary | ICD-10-CM

## 2022-09-09 DIAGNOSIS — O99824 Streptococcus B carrier state complicating childbirth: Secondary | ICD-10-CM | POA: Diagnosis present

## 2022-09-09 NOTE — MAU Note (Signed)
..  Sarah Reese is a 37 y.o. at [redacted]w[redacted]d here in MAU reporting:   Contractions every: 3 minutes Onset of ctx: Today Pain score: 7/10  ROM: Intact Vaginal Bleeding: None Last SVE: none  Epidural: Not planning  Fetal Movement: Reports positive FM FHT:160 via Patient taken straight to room  Vitals:   09/09/22 2359  BP: 122/81  Pulse: 83  Resp: 20  Temp: 97.6 F (36.4 C)  SpO2: 100%       OB Office: Faculty GBS: Unknown HSV: Denies hx of HSV Lab orders placed from triage: MAU Labor Eval

## 2022-09-09 NOTE — MAU Note (Incomplete)
.  QMVHQIO Sarah Reese is a 37 y.o. at [redacted]w[redacted]d here in MAU reporting:   Contractions every: *** minutes Onset of ctx: {triageonset:29221} Pain score: {triagepain:29223}  ROM: {triagerom:29224} Vaginal Bleeding: {triagevb:29225} Last SVE: ***  Epidural: {planning/notplanning:29226}  Fetal Movement: {triagefm:29227} FHT:*** via {triageFHT:29220}  There were no vitals filed for this visit.     OB Office: {OB Office:29209} GBS: {MAU_GBS:29235} HSV: {MAU_hsv:29289} Lab orders placed from triage: {TRIAGELAB:29219}

## 2022-09-10 ENCOUNTER — Encounter (HOSPITAL_COMMUNITY): Payer: Self-pay | Admitting: Obstetrics and Gynecology

## 2022-09-10 ENCOUNTER — Inpatient Hospital Stay (HOSPITAL_COMMUNITY): Payer: Medicaid Other | Admitting: Anesthesiology

## 2022-09-10 ENCOUNTER — Other Ambulatory Visit: Payer: Self-pay

## 2022-09-10 DIAGNOSIS — O26893 Other specified pregnancy related conditions, third trimester: Secondary | ICD-10-CM | POA: Diagnosis present

## 2022-09-10 DIAGNOSIS — O9952 Diseases of the respiratory system complicating childbirth: Secondary | ICD-10-CM | POA: Diagnosis present

## 2022-09-10 DIAGNOSIS — O9902 Anemia complicating childbirth: Secondary | ICD-10-CM | POA: Diagnosis present

## 2022-09-10 DIAGNOSIS — J45909 Unspecified asthma, uncomplicated: Secondary | ICD-10-CM | POA: Diagnosis present

## 2022-09-10 DIAGNOSIS — Z7982 Long term (current) use of aspirin: Secondary | ICD-10-CM | POA: Diagnosis not present

## 2022-09-10 DIAGNOSIS — O358XX0 Maternal care for other (suspected) fetal abnormality and damage, not applicable or unspecified: Secondary | ICD-10-CM | POA: Diagnosis present

## 2022-09-10 DIAGNOSIS — O99824 Streptococcus B carrier state complicating childbirth: Secondary | ICD-10-CM | POA: Diagnosis present

## 2022-09-10 DIAGNOSIS — Z3A38 38 weeks gestation of pregnancy: Secondary | ICD-10-CM | POA: Diagnosis not present

## 2022-09-10 DIAGNOSIS — Z5982 Transportation insecurity: Secondary | ICD-10-CM | POA: Diagnosis not present

## 2022-09-10 DIAGNOSIS — Z5986 Financial insecurity: Secondary | ICD-10-CM | POA: Diagnosis not present

## 2022-09-10 DIAGNOSIS — O09523 Supervision of elderly multigravida, third trimester: Secondary | ICD-10-CM | POA: Diagnosis not present

## 2022-09-10 LAB — CBC
HCT: 33.8 % — ABNORMAL LOW (ref 36.0–46.0)
Hemoglobin: 10.9 g/dL — ABNORMAL LOW (ref 12.0–15.0)
MCH: 29.7 pg (ref 26.0–34.0)
MCHC: 32.2 g/dL (ref 30.0–36.0)
MCV: 92.1 fL (ref 80.0–100.0)
Platelets: 140 10*3/uL — ABNORMAL LOW (ref 150–400)
RBC: 3.67 MIL/uL — ABNORMAL LOW (ref 3.87–5.11)
RDW: 15.1 % (ref 11.5–15.5)
WBC: 8.1 10*3/uL (ref 4.0–10.5)
nRBC: 0 % (ref 0.0–0.2)

## 2022-09-10 LAB — TYPE AND SCREEN
ABO/RH(D): O POS
Antibody Screen: NEGATIVE

## 2022-09-10 LAB — CULTURE, BETA STREP (GROUP B ONLY): Strep Gp B Culture: POSITIVE — AB

## 2022-09-10 LAB — GROUP B STREP BY PCR: Group B strep by PCR: POSITIVE — AB

## 2022-09-10 LAB — RPR: RPR Ser Ql: NONREACTIVE

## 2022-09-10 MED ORDER — ONDANSETRON HCL 4 MG/2ML IJ SOLN
4.0000 mg | Freq: Four times a day (QID) | INTRAMUSCULAR | Status: DC | PRN
  Administered 2022-09-10: 4 mg via INTRAVENOUS
  Filled 2022-09-10: qty 2

## 2022-09-10 MED ORDER — LACTATED RINGERS IV SOLN: INTRAVENOUS | Status: DC

## 2022-09-10 MED ORDER — OXYTOCIN BOLUS FROM INFUSION
333.0000 mL | Freq: Once | INTRAVENOUS | Status: AC
  Administered 2022-09-11: 333 mL via INTRAVENOUS

## 2022-09-10 MED ORDER — FENTANYL-BUPIVACAINE-NACL 0.5-0.125-0.9 MG/250ML-% EP SOLN: 12.0000 mL/h | EPIDURAL | Status: DC | PRN

## 2022-09-10 MED ORDER — OXYTOCIN-SODIUM CHLORIDE 30-0.9 UT/500ML-% IV SOLN
2.5000 [IU]/h | INTRAVENOUS | Status: DC
  Administered 2022-09-11: 2.5 [IU]/h via INTRAVENOUS
  Filled 2022-09-10: qty 500

## 2022-09-10 MED ORDER — EPHEDRINE 5 MG/ML INJ: 10.0000 mg | INTRAVENOUS | Status: DC | PRN

## 2022-09-10 MED ORDER — EPHEDRINE 5 MG/ML INJ
10.0000 mg | INTRAVENOUS | Status: DC | PRN
  Filled 2022-09-10: qty 5

## 2022-09-10 MED ORDER — LACTATED RINGERS IV SOLN
500.0000 mL | Freq: Once | INTRAVENOUS | Status: AC
  Administered 2022-09-10: 500 mL via INTRAVENOUS

## 2022-09-10 MED ORDER — SODIUM CHLORIDE 0.9 % IV SOLN
5.0000 10*6.[IU] | Freq: Once | INTRAVENOUS | Status: AC
  Administered 2022-09-10: 5 10*6.[IU] via INTRAVENOUS
  Filled 2022-09-10: qty 5

## 2022-09-10 MED ORDER — PHENYLEPHRINE 80 MCG/ML (10ML) SYRINGE FOR IV PUSH (FOR BLOOD PRESSURE SUPPORT): 80.0000 ug | PREFILLED_SYRINGE | INTRAVENOUS | Status: DC | PRN

## 2022-09-10 MED ORDER — DIPHENHYDRAMINE HCL 50 MG/ML IJ SOLN: 12.5000 mg | INTRAMUSCULAR | Status: DC | PRN

## 2022-09-10 MED ORDER — FENTANYL-BUPIVACAINE-NACL 0.5-0.125-0.9 MG/250ML-% EP SOLN
12.0000 mL/h | EPIDURAL | Status: DC | PRN
  Administered 2022-09-10: 12 mL/h via EPIDURAL
  Filled 2022-09-10: qty 250

## 2022-09-10 MED ORDER — SOD CITRATE-CITRIC ACID 500-334 MG/5ML PO SOLN: 30.0000 mL | ORAL | Status: DC | PRN

## 2022-09-10 MED ORDER — OXYTOCIN-SODIUM CHLORIDE 30-0.9 UT/500ML-% IV SOLN
1.0000 m[IU]/min | INTRAVENOUS | Status: DC
  Administered 2022-09-10: 8 m[IU]/min via INTRAVENOUS
  Administered 2022-09-10: 10 m[IU]/min via INTRAVENOUS
  Administered 2022-09-10: 2 m[IU]/min via INTRAVENOUS
  Administered 2022-09-10: 12 m[IU]/min via INTRAVENOUS
  Administered 2022-09-11: 14 m[IU]/min via INTRAVENOUS

## 2022-09-10 MED ORDER — PENICILLIN G POT IN DEXTROSE 60000 UNIT/ML IV SOLN
3.0000 10*6.[IU] | INTRAVENOUS | Status: DC
  Administered 2022-09-10 (×3): 3 10*6.[IU] via INTRAVENOUS
  Filled 2022-09-10 (×3): qty 50

## 2022-09-10 MED ORDER — FLEET ENEMA 7-19 GM/118ML RE ENEM: 1.0000 | ENEMA | RECTAL | Status: DC | PRN

## 2022-09-10 MED ORDER — OXYCODONE-ACETAMINOPHEN 5-325 MG PO TABS: 2.0000 | ORAL_TABLET | ORAL | Status: DC | PRN

## 2022-09-10 MED ORDER — LIDOCAINE HCL (PF) 1 % IJ SOLN: 30.0000 mL | INTRAMUSCULAR | Status: DC | PRN

## 2022-09-10 MED ORDER — OXYCODONE-ACETAMINOPHEN 5-325 MG PO TABS: 1.0000 | ORAL_TABLET | ORAL | Status: DC | PRN

## 2022-09-10 MED ORDER — TERBUTALINE SULFATE 1 MG/ML IJ SOLN: 0.2500 mg | Freq: Once | INTRAMUSCULAR | Status: DC | PRN

## 2022-09-10 MED ORDER — ACETAMINOPHEN 325 MG PO TABS: 650.0000 mg | ORAL_TABLET | ORAL | Status: DC | PRN

## 2022-09-10 MED ORDER — FENTANYL CITRATE (PF) 100 MCG/2ML IJ SOLN
100.0000 ug | INTRAMUSCULAR | Status: DC | PRN
  Administered 2022-09-10 (×2): 100 ug via INTRAVENOUS
  Filled 2022-09-10 (×2): qty 2

## 2022-09-10 MED ORDER — LIDOCAINE HCL (PF) 1 % IJ SOLN
INTRAMUSCULAR | Status: DC | PRN
  Administered 2022-09-10: 8 mL via EPIDURAL

## 2022-09-10 MED ORDER — LACTATED RINGERS IV SOLN: 500.0000 mL | INTRAVENOUS | Status: DC | PRN

## 2022-09-10 MED ORDER — FENTANYL CITRATE (PF) 100 MCG/2ML IJ SOLN
INTRAMUSCULAR | Status: AC
  Administered 2022-09-10: 100 ug via INTRAVENOUS
  Filled 2022-09-10: qty 2

## 2022-09-10 MED ORDER — PHENYLEPHRINE 80 MCG/ML (10ML) SYRINGE FOR IV PUSH (FOR BLOOD PRESSURE SUPPORT)
80.0000 ug | PREFILLED_SYRINGE | INTRAVENOUS | Status: DC | PRN
  Filled 2022-09-10: qty 10

## 2022-09-10 NOTE — Plan of Care (Signed)

## 2022-09-10 NOTE — Anesthesia Procedure Notes (Signed)
Epidural Patient location during procedure: OB Start time: 09/10/2022 10:06 PM End time: 09/10/2022 10:15 PM  Staffing Anesthesiologist: Bethena Midget, MD Performed: other anesthesia staff   Preanesthetic Checklist Completed: patient identified, IV checked, site marked, risks and benefits discussed, surgical consent, monitors and equipment checked, pre-op evaluation and timeout performed  Epidural Patient position: sitting Prep: DuraPrep and site prepped and draped Patient monitoring: continuous pulse ox and blood pressure Approach: midline Location: L3-L4 Injection technique: LOR air  Needle:  Needle type: Tuohy  Needle gauge: 17 G Needle length: 9 cm and 9 Needle insertion depth: 7 cm Catheter type: closed end flexible Catheter size: 19 Gauge Catheter at skin depth: 13 cm Test dose: negative  Assessment Events: blood not aspirated, no cerebrospinal fluid, injection not painful, no injection resistance, no paresthesia and negative IV test

## 2022-09-10 NOTE — Anesthesia Preprocedure Evaluation (Signed)
Anesthesia Evaluation  Patient identified by MRN, date of birth, ID band Patient awake    Reviewed: Allergy & Precautions, H&P , NPO status , Patient's Chart, lab work & pertinent test results, reviewed documented beta blocker date and time   Airway Mallampati: II  TM Distance: >3 FB Neck ROM: full    Dental no notable dental hx. (+) Teeth Intact, Dental Advisory Given   Pulmonary asthma    Pulmonary exam normal breath sounds clear to auscultation       Cardiovascular Normal cardiovascular exam+ Valvular Problems/Murmurs  Rhythm:regular Rate:Normal     Neuro/Psych  PSYCHIATRIC DISORDERS Anxiety     negative neurological ROS     GI/Hepatic negative GI ROS, Neg liver ROS,,,  Endo/Other  negative endocrine ROS    Renal/GU negative Renal ROS  negative genitourinary   Musculoskeletal   Abdominal   Peds  Hematology negative hematology ROS (+) Blood dyscrasia, anemia   Anesthesia Other Findings   Reproductive/Obstetrics (+) Pregnancy                             Anesthesia Physical Anesthesia Plan  ASA: 3  Anesthesia Plan: Epidural   Post-op Pain Management: Minimal or no pain anticipated   Induction: Intravenous  PONV Risk Score and Plan: 2  Airway Management Planned: Natural Airway  Additional Equipment: None  Intra-op Plan:   Post-operative Plan:   Informed Consent: I have reviewed the patients History and Physical, chart, labs and discussed the procedure including the risks, benefits and alternatives for the proposed anesthesia with the patient or authorized representative who has indicated his/her understanding and acceptance.       Plan Discussed with: Anesthesiologist and CRNA  Anesthesia Plan Comments:        Anesthesia Quick Evaluation

## 2022-09-10 NOTE — H&P (Signed)
OBSTETRIC ADMISSION HISTORY AND PHYSICAL   Sarah Reese is a 37 y.o. female [phone] with IUP at [redacted]w[redacted]d by LMP presenting for spontaneous onset of labor with contractions developing at ~7pm last night. She reports +FMs, No LOF, no VB, no blurry vision, headaches or peripheral edema, and RUQ pain. She plans on both breast and bottle feeding. She requests nothing for birth control. The patient is having a boy whose name will be Brooke Dare and she would like for the baby to be circumcised. She received her prenatal care at [facility]. She has not had a C-section, but has had a D&C.   Dating: By LMP --->  Estimated Date of Delivery: 09/23/22   Sono:   @[redacted]w[redacted]d , CWD, normal anatomy, cephalic presentation, anterior placenta, 3270g, 74% EFW     Prenatal History/Complications: Cardiac murmur, Anemia requiring venofer x2. Echogenic intracardiac focus of fetus on prenatal ultrasound; Congenital hypoplasia of nasal bone   Past Medical History:     Past Medical History:  Diagnosis Date   Anemia     Asthma     Carpal tunnel syndrome of right wrist 04/26/2021   Heart murmur      since pregnancy   Low lying placenta nos or without hemorrhage (res, second trimester 06/01/2022    F/u @ > 28 weeks   Low lying placenta nos or without hemorrhage (resolved), second trimester 06/01/2022    Resolved   Scoliosis     Scoliosis, or kyphoscoliosis, idiopathic 09/30/2021   Trigger finger of right hand 04/26/2021          Past Surgical History:      Past Surgical History:  Procedure Laterality Date   DILATION AND CURETTAGE OF UTERUS       WISDOM TOOTH EXTRACTION              Obstetrical History: OB History       Gravida  5   Para  2   Term  2   Preterm  0   AB  2   Living  2        SAB  2   IAB  0   Ectopic  0   Multiple  0   Live Births  2               Social History Social History         Socioeconomic History   Marital status: Single      Spouse name: Not on file    Number of children: Not on file   Years of education: Not on file   Highest education level: Some college, no degree  Occupational History   Not on file  Tobacco Use   Smoking status: Never   Smokeless tobacco: Never  Vaping Use   Vaping status: Former   Quit date: 03/03/2021   Devices: one month trial  Substance and Sexual Activity   Alcohol use: No   Drug use: No   Sexual activity: Yes      Birth control/protection: None      Comment: same-sex relationship at this time  Other Topics Concern   Not on file  Social History Narrative    ** Merged History Encounter **         Social Determinants of Health        Financial Resource Strain: Medium Risk (07/12/2022)    Overall Financial Resource Strain (CARDIA)     Difficulty of Paying Living Expenses: Somewhat hard  Food Insecurity: No  Food Insecurity (07/13/2022)    Hunger Vital Sign     Worried About Running Out of Food in the Last Year: Never true     Ran Out of Food in the Last Year: Never true  Transportation Needs: Unmet Transportation Needs (07/13/2022)    PRAPARE - Therapist, art (Medical): Yes     Lack of Transportation (Non-Medical): No  Physical Activity: Unknown (07/12/2022)    Exercise Vital Sign     Days of Exercise per Week: 0 days     Minutes of Exercise per Session: Not on file  Stress: Stress Concern Present (07/12/2022)    [employer] of Occupational Health - Occupational Stress Questionnaire     Feeling of Stress : To some extent  Social Connections: Moderately Isolated (07/12/2022)    Social Connection and Isolation Panel [NHANES]     Frequency of Communication with Friends and Family: More than three times a week     Frequency of Social Gatherings with Friends and Family: Twice a week     Attends Religious Services: 1 to 4 times per year     Active Member of [employer] or Organizations: No     Attends Engineer, structural: Not on file     Marital Status: Never married       Family History:      Family History  Problem Relation Age of Onset   Hypertension Mother     Kidney disease Father     Hypertension Maternal Grandmother            Allergies: Allergies  No Known Allergies            Medications Prior to Admission  Medication Sig Dispense Refill Last Dose   albuterol (VENTOLIN HFA) 108 (90 Base) MCG/ACT inhaler Inhale 2 puffs into the lungs every 6 (six) hours as needed for wheezing or shortness of breath. 8 g 1 Past Week   aspirin 81 MG chewable tablet Chew 1 tablet (81 mg total) by mouth daily. 90 tablet 3 Past Week   Prenatal Vit-Fe Fumarate-FA (PREPLUS) 27-1 MG TABS Take 1 tablet by mouth daily. 90 tablet 3 09/09/2022   Blood Pressure Monitoring (BLOOD PRESSURE KIT) DEVI 1 Device by Does not apply route as needed. 1 each 0     cyclobenzaprine (FLEXERIL) 10 MG tablet Take 1 tablet (10 mg total) by mouth every 8 (eight) hours as needed for muscle spasms. 30 tablet 1     fluticasone (FLONASE) 50 MCG/ACT nasal spray Place 2 sprays into both nostrils daily. 30 g 2     Misc. Devices (GOJJI WEIGHT SCALE) MISC 1 Device by Does not apply route once a week. 1 each 0              Review of Systems    All systems reviewed and negative except as stated in HPI   Blood pressure 125/80, pulse 80, temperature 97.9 F (36.6 C), temperature source Oral, resp. rate 18, height 5\' 5"  (1.651 m), weight 94.3 kg, last menstrual period 12/17/2021, SpO2 100%, unknown if currently breastfeeding. General appearance: alert, cooperative, appears stated age, no distress, and mild discomfort from pelvic pain Lungs: clear to auscultation bilaterally Heart: regular rate and rhythm Abdomen: soft, non-tender; bowel sounds normal Extremities: Homans sign is negative, no sign of DVT Presentation: cephalic Fetal monitoringBaseline: 135 bpm, Variability: Good {> 6 bpm), Accelerations: Reactive, and Decelerations: Absent Uterine activity Date/time of onset: 7/19 1900,  Frequency: Every 3-6  minutes, Duration: 40-70 seconds, and Intensity: mild Dilation: 3 Effacement (%): 40 Station: -3 Exam by:: Santiago Bur, RN     Prenatal labs: ABO, Rh: --/--/PENDING (07/20 1308) Antibody: PENDING (07/20 0328) Rubella: 2.43 (01/25 1614) RPR: Non Reactive (05/22 0900)  HBsAg: Negative (01/25 1614)  HIV: Non Reactive (05/22 0900)  GBS:    2 hour GTT: Normal Genetic screening: NIPS- low risk female. AFP and Horizon negative. Anatomy US: EIF, hypoplastic nasal bone, UTD A1 (resolved), low lying placenta (resolved)    Prenatal Transfer Tool  Maternal Diabetes: No Genetic Screening: Normal Maternal Ultrasounds/Referrals: Normal Fetal Ultrasounds or other Referrals:  None Maternal Substance Abuse:  No Significant Maternal Medications:  None Significant Maternal Lab Results:  None Number of Prenatal Visits:greater than 3 verified prenatal visits Other Comments:  None         Results for orders placed or performed during the hospital encounter of 09/09/22 (from the past 24 hour(s))  Type and screen [facility]    Collection Time: 09/10/22  3:28 AM  Result Value Ref Range    ABO/RH(D) PENDING      Antibody Screen PENDING      Sample Expiration          09/13/2022,2359 Performed at [facility], [address], [place], [place] [address]    CBC    Collection Time: 09/10/22  3:37 AM  Result Value Ref Range    WBC 8.1 4.0 - 10.5 K/uL    RBC 3.67 (L) 3.87 - 5.11 MIL/uL    Hemoglobin 10.9 (L) 12.0 - 15.0 g/dL    HCT 69.6 (L) 29.5 - 46.0 %    MCV 92.1 80.0 - 100.0 fL    MCH 29.7 26.0 - 34.0 pg    MCHC 32.2 30.0 - 36.0 g/dL    RDW 28.4 13.2 - 44.0 %    Platelets 140 (L) 150 - 400 K/uL    nRBC 0.0 0.0 - 0.2 %          Patient Active Problem List    Diagnosis Date Noted   Normal labor 09/10/2022   Adjustment disorder with anxious mood 08/11/2022   Anemia affecting pregnancy in third trimester 07/26/2022   Echogenic intracardiac  focus of fetus on prenatal ultrasound 07/04/2022   Congenital hypoplasia of nasal bone 07/04/2022   Asthma affecting pregnancy in second trimester 04/29/2022   Supervision of high-risk pregnancy 03/09/2022   AMA (advanced maternal age) multigravida 35+ 03/09/2022   Short interval between pregnancies affecting pregnancy, antepartum 03/09/2022   Cardiac murmur 04/04/2016      Assessment/Plan:  NUUVOZD Shantella Blubaugh is a 37 y.o. [id number] at [redacted]w[redacted]d here for spontaneous onset of labor by frequent contractions, no LOF/VB. Fetal movements normal.   #Labor: Plan for expectant management.  Anticipate SVD.  Will continue to monitor.  #Pain:  Does not want an epidural. Desires NO2 during active delivery. Fentanyl given in the meantime. 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  Will notify peds upon delivery.   Kayleen Memos, Medical Student  09/10/2022, 4:31 AM   ---- Flonnie Hailstone of Supervision of Student:  I confirm that I have verified the information documented in the medical student's note and that I have also personally performed the history, physical exam and all medical decision making activities.  I have verified  that all services and findings are accurately documented in this student's note; and I agree with management and plan as outlined in the documentation. I have also made any necessary editorial changes.   Myrtie Hawk, DO Center for [employer], [facility] 09/10/2022 6:52 AM     Attestation of Attending Supervision of OB Fellow: Evaluation, management, and procedures were performed by the [facility] Fellow under my supervision and collaboration.  I have reviewed the OB Fellow's note and chart, and I agree with the management and plan.  Baldemar Lenis, MD, [facility] Attending Center for [employer] (Faculty Practice)  09/10/2022 2:00 PM

## 2022-09-10 NOTE — Progress Notes (Addendum)
OBSTETRIC ADMISSION HISTORY AND PHYSICAL  Sarah Reese is a 37 y.o. female [phone] with IUP at [redacted]w[redacted]d by LMP presenting for spontaneous onset of labor with contractions developing at ~7pm last night. She reports +FMs, No LOF, no VB, no blurry vision, headaches or peripheral edema, and RUQ pain. She plans on both breast and bottle feeding. She requests nothing for birth control. The patient is having a boy whose name will be Brooke Dare and she would like for the baby to be circumcised. She received her prenatal care at [facility]. She has not had a C-section, but has had a D&C.  Dating: By LMP --->  Estimated Date of Delivery: 09/23/22  Sono:   @[redacted]w[redacted]d , CWD, normal anatomy, cephalic presentation, anterior placenta, 3270g, 74% EFW   Prenatal History/Complications: Cardiac murmur, Anemia requiring venofer x2. Echogenic intracardiac focus of fetus on prenatal ultrasound; Congenital hypoplasia of nasal bone  Past Medical History: Past Medical History:  Diagnosis Date   Anemia    Asthma    Carpal tunnel syndrome of right wrist 04/26/2021   Heart murmur    since pregnancy   Low lying placenta nos or without hemorrhage (res, second trimester 06/01/2022   F/u @ > 28 weeks   Low lying placenta nos or without hemorrhage (resolved), second trimester 06/01/2022   Resolved   Scoliosis    Scoliosis, or kyphoscoliosis, idiopathic 09/30/2021   Trigger finger of right hand 04/26/2021    Past Surgical History: Past Surgical History:  Procedure Laterality Date   DILATION AND CURETTAGE OF UTERUS     WISDOM TOOTH EXTRACTION      Obstetrical History: OB History     Gravida  5   Para  2   Term  2   Preterm  0   AB  2   Living  2      SAB  2   IAB  0   Ectopic  0   Multiple  0   Live Births  2           Social History Social History   Socioeconomic History   Marital status: Single    Spouse name: Not on file   Number of children: Not on file   Years of education: Not on  file   Highest education level: Some college, no degree  Occupational History   Not on file  Tobacco Use   Smoking status: Never   Smokeless tobacco: Never  Vaping Use   Vaping status: Former   Quit date: 03/03/2021   Devices: one month trial  Substance and Sexual Activity   Alcohol use: No   Drug use: No   Sexual activity: Yes    Birth control/protection: None    Comment: same-sex relationship at this time  Other Topics Concern   Not on file  Social History Narrative   ** Merged History Encounter **       Social Determinants of Health   Financial Resource Strain: Medium Risk (07/12/2022)   Overall Financial Resource Strain (CARDIA)    Difficulty of Paying Living Expenses: Somewhat hard  Food Insecurity: No Food Insecurity (07/13/2022)   Hunger Vital Sign    Worried About Running Out of Food in the Last Year: Never true    Ran Out of Food in the Last Year: Never true  Transportation Needs: Unmet Transportation Needs (07/13/2022)   PRAPARE - Administrator, Civil Service (Medical): Yes    Lack of Transportation (Non-Medical): No  Physical Activity: Unknown (  07/12/2022)   Exercise Vital Sign    Days of Exercise per Week: 0 days    Minutes of Exercise per Session: Not on file  Stress: Stress Concern Present (07/12/2022)   [employer] of Occupational Health - Occupational Stress Questionnaire    Feeling of Stress : To some extent  Social Connections: Moderately Isolated (07/12/2022)   Social Connection and Isolation Panel [NHANES]    Frequency of Communication with Friends and Family: More than three times a week    Frequency of Social Gatherings with Friends and Family: Twice a week    Attends Religious Services: 1 to 4 times per year    Active Member of [employer] or Organizations: No    Attends Engineer, structural: Not on file    Marital Status: Never married    Family History: Family History  Problem Relation Age of Onset   Hypertension Mother     Kidney disease Father    Hypertension Maternal Grandmother     Allergies: No Known Allergies  Medications Prior to Admission  Medication Sig Dispense Refill Last Dose   albuterol (VENTOLIN HFA) 108 (90 Base) MCG/ACT inhaler Inhale 2 puffs into the lungs every 6 (six) hours as needed for wheezing or shortness of breath. 8 g 1 Past Week   aspirin 81 MG chewable tablet Chew 1 tablet (81 mg total) by mouth daily. 90 tablet 3 Past Week   Prenatal Vit-Fe Fumarate-FA (PREPLUS) 27-1 MG TABS Take 1 tablet by mouth daily. 90 tablet 3 09/09/2022   Blood Pressure Monitoring (BLOOD PRESSURE KIT) DEVI 1 Device by Does not apply route as needed. 1 each 0    cyclobenzaprine (FLEXERIL) 10 MG tablet Take 1 tablet (10 mg total) by mouth every 8 (eight) hours as needed for muscle spasms. 30 tablet 1    fluticasone (FLONASE) 50 MCG/ACT nasal spray Place 2 sprays into both nostrils daily. 30 g 2    Misc. Devices (GOJJI WEIGHT SCALE) MISC 1 Device by Does not apply route once a week. 1 each 0      Review of Systems   All systems reviewed and negative except as stated in HPI  Blood pressure 125/80, pulse 80, temperature 97.9 F (36.6 C), temperature source Oral, resp. rate 18, height 5\' 5"  (1.651 m), weight 94.3 kg, last menstrual period 12/17/2021, SpO2 100%, unknown if currently breastfeeding. General appearance: alert, cooperative, appears stated age, no distress, and mild discomfort from pelvic pain Lungs: clear to auscultation bilaterally Heart: regular rate and rhythm Abdomen: soft, non-tender; bowel sounds normal Extremities: Homans sign is negative, no sign of DVT Presentation: cephalic Fetal monitoringBaseline: 135 bpm, Variability: Good {> 6 bpm), Accelerations: Reactive, and Decelerations: Absent Uterine activity Date/time of onset: 7/19 1900, Frequency: Every 3-6 minutes, Duration: 40-70 seconds, and Intensity: mild Dilation: 3 Effacement (%): 40 Station: -3 Exam by:: Santiago Bur,  RN   Prenatal labs: ABO, Rh: --/--/PENDING (07/20 0272) Antibody: PENDING (07/20 0328) Rubella: 2.43 (01/25 1614) RPR: Non Reactive (05/22 0900)  HBsAg: Negative (01/25 1614)  HIV: Non Reactive (05/22 0900)  GBS:    2 hour GTT: Normal Genetic screening: NIPS- low risk female. AFP and Horizon negative. Anatomy US: EIF, hypoplastic nasal bone, UTD A1 (resolved), low lying placenta (resolved)   Prenatal Transfer Tool  Maternal Diabetes: No Genetic Screening: Normal Maternal Ultrasounds/Referrals: Normal Fetal Ultrasounds or other Referrals:  None Maternal Substance Abuse:  No Significant Maternal Medications:  None Significant Maternal Lab Results:  None Number of Prenatal Visits:greater than  3 verified prenatal visits Other Comments:  None  Results for orders placed or performed during the hospital encounter of 09/09/22 (from the past 24 hour(s))  Type and screen [facility]   Collection Time: 09/10/22  3:28 AM  Result Value Ref Range   ABO/RH(D) PENDING    Antibody Screen PENDING    Sample Expiration      09/13/2022,2359 Performed at [facility], [address] [place], [place] [address]   CBC   Collection Time: 09/10/22  3:37 AM  Result Value Ref Range   WBC 8.1 4.0 - 10.5 K/uL   RBC 3.67 (L) 3.87 - 5.11 MIL/uL   Hemoglobin 10.9 (L) 12.0 - 15.0 g/dL   HCT 60.4 (L) 54.0 - 98.1 %   MCV 92.1 80.0 - 100.0 fL   MCH 29.7 26.0 - 34.0 pg   MCHC 32.2 30.0 - 36.0 g/dL   RDW 19.1 47.8 - 29.5 %   Platelets 140 (L) 150 - 400 K/uL   nRBC 0.0 0.0 - 0.2 %    Patient Active Problem List   Diagnosis Date Noted   Normal labor 09/10/2022   Adjustment disorder with anxious mood 08/11/2022   Anemia affecting pregnancy in third trimester 07/26/2022   Echogenic intracardiac focus of fetus on prenatal ultrasound 07/04/2022   Congenital hypoplasia of nasal bone 07/04/2022   Asthma affecting pregnancy in second trimester 04/29/2022   Supervision of high-risk  pregnancy 03/09/2022   AMA (advanced maternal age) multigravida 35+ 03/09/2022   Short interval between pregnancies affecting pregnancy, antepartum 03/09/2022   Cardiac murmur 04/04/2016    Assessment/Plan:  AOZHYQM Jerrye Seebeck is a 37 y.o. [id number] at [redacted]w[redacted]d here for spontaneous onset of labor by frequent contractions, no LOF/VB. Fetal movements normal.  #Labor: Plan for expectant management.  Anticipate SVD.  Will continue to monitor.  #Pain: Does not want an epidural. Desires NO2 during active delivery. Fentanyl given in the meantime. 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  Will notify peds upon delivery.  Kayleen Memos, Medical Student  09/10/2022, 4:31 AM  ---- Flonnie Hailstone of Supervision of Student:  I confirm that I have verified the information documented in the medical student's note and that I have also personally performed the history, physical exam and all medical decision making activities.  I have verified that all services and findings are accurately documented in this student's note; and I agree with management and plan as outlined in the documentation. I have also made any necessary editorial changes.  Myrtie Hawk, DO Center for [employer], [facility] 09/10/2022 6:52 AM

## 2022-09-10 NOTE — Progress Notes (Signed)
Labor Progress Note Sarah Reese is a 37 y.o. [id number] at [redacted]w[redacted]d presented for SOL. Prenatal hx: cardiac murmur, anemia requiring venofer x2, echogenic intracardiac focus of fetus on prenatal [place], congenital hypoplasia of nasal bone S: frustrated with lack of progress in labor, pt states her previous deliveries were much quicker  O:  BP 132/79   Pulse 77   Temp 97.7 F (36.5 C) (Oral)   Resp 18   Ht 5\' 5"  (1.651 m)   Wt 94.3 kg   LMP 12/17/2021 (Exact Date)   SpO2 100%   BMI 34.61 kg/m  EFM: 140/moderate variability/15x15 accels/no decels  CVE: Dilation: 4 Effacement (%): 50 Cervical Position: Posterior Station: -3 Presentation: Vertex Exam by:: Dr. Rayfield Citizen   A&P: 37 y.o. [id number] [redacted]w[redacted]d here for SOL #Labor: CVE unchanged from this afternoon. Will increase pit. Unable to AROM at this time, posterior ballotable cervix and fetal head not well applied.  #Pain: considering epidural, continue PO/IV meds #FWB: cat 1 #GBS positive, PCN ppx given Fetal anomaly: EIF, hypoplastic nasal bone, will notify peds upon delivery   [name], DO 9:29 PM

## 2022-09-10 NOTE — Progress Notes (Addendum)
Labor Progress Note  Perlie Shequilla Goodgame is a 37 y.o. [id number] at [redacted]w[redacted]d presented for spontaneous labor  S: Patient is doing well managing her contractions. She has received her first dose of penicillin and is agreeable to attempt AROM following discussion of risks and benefits.   O:  BP 114/70   Pulse 77   Temp 97.7 F (36.5 C) (Oral)   Resp 18   Ht 5\' 5"  (1.651 m)   Wt 94.3 kg   LMP 12/17/2021 (Exact Date)   SpO2 100%   BMI 34.61 kg/m  EFM:145 bpm/Moderate variability/ 15x15 accels/ None decels CAT: 1 Toco: contracting every 4-6 minutes  CVE: Dilation: 4 Effacement (%): 50 Cervical Position: Posterior Station: -3 Presentation: Vertex Exam by:: Cresenzo, MD   A&P: 37 y.o. [id number] [redacted]w[redacted]d  here for spontaneous labor  #Labor: Patient has not progressed since 0800. Her cervix is posterior and fetal head is not well applied, unable to AROM at this time due to risk of cord prolapse. Will initiate pitocin to augment labor.  #Pain: PO/IV pain meds, patient still debating if she would like Epidural later.  #FWB: CAT 1 #GBS positive  Glee Arvin, MD [facility] PGY-3 09/10/22  1:44 PM

## 2022-09-11 ENCOUNTER — Encounter: Payer: Self-pay | Admitting: Certified Nurse Midwife

## 2022-09-11 ENCOUNTER — Encounter (HOSPITAL_COMMUNITY): Payer: Self-pay | Admitting: Obstetrics and Gynecology

## 2022-09-11 DIAGNOSIS — Z3A38 38 weeks gestation of pregnancy: Secondary | ICD-10-CM

## 2022-09-11 DIAGNOSIS — O09523 Supervision of elderly multigravida, third trimester: Secondary | ICD-10-CM

## 2022-09-11 LAB — CBC
HCT: 30.2 % — ABNORMAL LOW (ref 36.0–46.0)
Hemoglobin: 9.6 g/dL — ABNORMAL LOW (ref 12.0–15.0)
MCH: 29.1 pg (ref 26.0–34.0)
MCHC: 31.8 g/dL (ref 30.0–36.0)
MCV: 91.5 fL (ref 80.0–100.0)
Platelets: 115 10*3/uL — ABNORMAL LOW (ref 150–400)
RBC: 3.3 MIL/uL — ABNORMAL LOW (ref 3.87–5.11)
RDW: 15.1 % (ref 11.5–15.5)
WBC: 8.9 10*3/uL (ref 4.0–10.5)
nRBC: 0 % (ref 0.0–0.2)

## 2022-09-11 MED ORDER — ACETAMINOPHEN 325 MG PO TABS
650.0000 mg | ORAL_TABLET | ORAL | Status: DC | PRN
  Administered 2022-09-12: 650 mg via ORAL
  Filled 2022-09-11 (×2): qty 2

## 2022-09-11 MED ORDER — WITCH HAZEL-GLYCERIN EX PADS: 1.0000 | MEDICATED_PAD | CUTANEOUS | Status: DC | PRN

## 2022-09-11 MED ORDER — DIBUCAINE (PERIANAL) 1 % EX OINT: 1.0000 | TOPICAL_OINTMENT | CUTANEOUS | Status: DC | PRN

## 2022-09-11 MED ORDER — COCONUT OIL OIL: 1.0000 | TOPICAL_OIL | Status: DC | PRN

## 2022-09-11 MED ORDER — SODIUM CHLORIDE 0.9% FLUSH: 3.0000 mL | INTRAVENOUS | Status: DC | PRN

## 2022-09-11 MED ORDER — ONDANSETRON HCL 4 MG PO TABS: 4.0000 mg | ORAL_TABLET | ORAL | Status: DC | PRN

## 2022-09-11 MED ORDER — SIMETHICONE 80 MG PO CHEW: 80.0000 mg | CHEWABLE_TABLET | ORAL | Status: DC | PRN

## 2022-09-11 MED ORDER — ONDANSETRON HCL 4 MG/2ML IJ SOLN: 4.0000 mg | INTRAMUSCULAR | Status: DC | PRN

## 2022-09-11 MED ORDER — IBUPROFEN 600 MG PO TABS
600.0000 mg | ORAL_TABLET | Freq: Four times a day (QID) | ORAL | Status: DC
  Administered 2022-09-11 – 2022-09-12 (×5): 600 mg via ORAL
  Filled 2022-09-11 (×5): qty 1

## 2022-09-11 MED ORDER — TETANUS-DIPHTH-ACELL PERTUSSIS 5-2.5-18.5 LF-MCG/0.5 IM SUSY: 0.5000 mL | PREFILLED_SYRINGE | Freq: Once | INTRAMUSCULAR | Status: DC

## 2022-09-11 MED ORDER — BENZOCAINE-MENTHOL 20-0.5 % EX AERO: 1.0000 | INHALATION_SPRAY | CUTANEOUS | Status: DC | PRN

## 2022-09-11 MED ORDER — ZOLPIDEM TARTRATE 5 MG PO TABS: 5.0000 mg | ORAL_TABLET | Freq: Every evening | ORAL | Status: DC | PRN

## 2022-09-11 MED ORDER — PRENATAL MULTIVITAMIN CH
1.0000 | ORAL_TABLET | Freq: Every day | ORAL | Status: DC
  Administered 2022-09-11: 1 via ORAL
  Filled 2022-09-11: qty 1

## 2022-09-11 MED ORDER — DIPHENHYDRAMINE HCL 25 MG PO CAPS
25.0000 mg | ORAL_CAPSULE | Freq: Four times a day (QID) | ORAL | Status: DC | PRN
  Administered 2022-09-11: 25 mg via ORAL
  Filled 2022-09-11: qty 1

## 2022-09-11 MED ORDER — SODIUM CHLORIDE 0.9 % IV SOLN: 250.0000 mL | INTRAVENOUS | Status: DC | PRN

## 2022-09-11 MED ORDER — SODIUM CHLORIDE 0.9% FLUSH
3.0000 mL | Freq: Two times a day (BID) | INTRAVENOUS | Status: DC
  Administered 2022-09-11 (×2): 3 mL via INTRAVENOUS

## 2022-09-11 MED ORDER — SENNOSIDES-DOCUSATE SODIUM 8.6-50 MG PO TABS
2.0000 | ORAL_TABLET | ORAL | Status: DC
  Administered 2022-09-11 – 2022-09-12 (×2): 2 via ORAL
  Filled 2022-09-11 (×2): qty 2

## 2022-09-11 NOTE — Anesthesia Postprocedure Evaluation (Signed)
Anesthesia Post Note  Patient: Sarah Reese  Procedure(s) Performed: AN AD HOC LABOR EPIDURAL     Patient location during evaluation: Mother Baby Anesthesia Type: Epidural Level of consciousness: awake and alert Pain management: pain level controlled Vital Signs Assessment: post-procedure vital signs reviewed and stable Respiratory status: spontaneous breathing, nonlabored ventilation and respiratory function stable Cardiovascular status: stable Postop Assessment: no headache, no backache, epidural receding, no apparent nausea or vomiting, patient able to bend at knees, able to ambulate and adequate PO intake Anesthetic complications: no   No notable events documented.  Last Vitals:  Vitals:   09/11/22 0554 09/11/22 0855  BP: 98/72 111/72  Pulse: 84 78  Resp: 16 16  Temp: 36.7 C 36.6 C  SpO2: 98% 97%    Last Pain:  Vitals:   09/11/22 0855  TempSrc: Oral  PainSc:    Pain Goal:                Epidural/Spinal Function Cutaneous sensation: Normal sensation (09/11/22 0846), Patient able to flex knees: Yes (09/11/22 0846), Patient able to lift hips off bed: Yes (09/11/22 0846), Back pain beyond tenderness at insertion site: No (09/11/22 0846), Progressively worsening motor and/or sensory loss: No (09/11/22 0846), Bowel and/or bladder incontinence post epidural: No (09/11/22 0846)  Laban Emperor

## 2022-09-11 NOTE — Lactation Note (Signed)
This note was copied from a baby's chart. Lactation Consultation Note  Patient Name: Sarah Reese Date: 09/11/2022 Age:36 hours Reason for consult: Early term 37-38.6wks;1st time breastfeeding. Birth Parent informed LC she has decided to breastfeed infant and afterwards supplement with formula, this is her 1st time breastfeeding. Per Birth Parent infant latches well at the breast. Birth Parent wanted review latch in cross cradle hold, infant latched but not interested in feeding due having 20 mls of formula prior to [facility] entering the room. Support Person did skin to skin with infant. While LC in the room, infant had large stool and void that Birth Parent and Support Person changed. Infant had one stool and 3 void diapers since birth. Birth Parent has DEBP at home and she does not want hand pump she does not like it.   Birth Parent current feeding plan:  1- Birth Parent plans to latch infant 1st for every feeding before supplementing infant with formula , Birth Parent will BF infant  according to hunger cues, on demand, skin to skin. Birth Parent will call RN.LC for further latch assistance if needed. 2- After latching infant at the breast if infant is still cuing Birth Parent will supplement infant with formula. Breastfeeding Supplement sheet given, Birth Parent knows if infant latches at breast she will supplement infant with 5-7 mls per  feeding but if infant does not latch she will offer 5-15 mls per feeding or more if infant wants it. Birth Parent has "Feeding Amount "sheet.  Maternal Data    Feeding Mother's Current Feeding Choice: Breast Milk and Formula Nipple Type: Slow - flow  LATCH Score Latch: Grasps breast easily, tongue down, lips flanged, rhythmical sucking.  Audible Swallowing: None  Type of Nipple: Everted at rest and after stimulation  Comfort (Breast/Nipple): Soft / non-tender  Hold (Positioning): Assistance needed to correctly position infant at breast and  maintain latch.  LATCH Score: 7 - infant was recently given 20 mls of formula , latched well not not interested in feeding , only held nipple in mouth, due to being full.    Lactation Tools Discussed/Used    Interventions Interventions: Position options;Skin to skin;Support pillows;Adjust position;Breast compression;Breast massage  Discharge    Consult Status Consult Status: Follow-up Date: 09/12/22 Follow-up type: In-patient    Frederico Hamman 09/11/2022, 10:39 PM

## 2022-09-11 NOTE — Lactation Note (Signed)
This note was copied from a baby's chart. Lactation Consultation Note  Patient Name: Sarah Reese [id number] Date: 09/11/2022 Age:36 hours Reason for consult: Initial assessment  P3, Mother states she would like to breastfeed and formula feed. Recommend mother call for LC to assist with latching.  Provided volume guidelines and encouraged offering breast before formula.   Maternal Data Has patient been taught Hand Expression?: Yes Does the patient have breastfeeding experience prior to this delivery?: Yes How long did the patient breastfeed?: 2 days  Feeding Mother's Current Feeding Choice: Breast Milk and Formula Nipple Type: Slow - flow  Interventions Interventions: Education;LC Services brochure;Breast feeding basics reviewed   Consult Status Consult Status: Follow-up Date: 09/11/22 Follow-up type: In-patient    Dahlia Byes [facility] 09/11/2022, 11:47 AM

## 2022-09-11 NOTE — Lactation Note (Signed)
This note was copied from a baby's chart. Lactation Consultation Note  Patient Name: Sarah Reese [id number] Date: 09/11/2022 Age:36 hours Reason for consult: Follow-up assessment  P3, RN and mother's request to assist with feeding.  Assisted with latching in both cross cradle hold and football hold.  Noted intermittent sucking and swallowing.  Encouraged mother to support breast from underneath. Suggest calling for help as needed.  Suggest mother offer breast before formula and wait on pacifer use.  Feeding Mother's Current Feeding Choice: Breast Milk and Formula Nipple Type: Slow - flow  LATCH Score Latch: Grasps breast easily, tongue down, lips flanged, rhythmical sucking.  Audible Swallowing: A few with stimulation  Type of Nipple: Everted at rest and after stimulation  Comfort (Breast/Nipple): Soft / non-tender  Hold (Positioning): Assistance needed to correctly position infant at breast and maintain latch.  LATCH Score: 8  Interventions Interventions: Breast feeding basics reviewed;Assisted with latch;Position options;Support pillows;Education  Consult Status Consult Status: Follow-up Date: 09/12/22 Follow-up type: In-patient    Dahlia Byes [facility] 09/11/2022, 3:01 PM

## 2022-09-11 NOTE — Discharge Summary (Signed)
Postpartum Discharge Summary  Date of Service updated***     Patient Name: Sarah Reese DOB: 09-16-1985 MRN: [id number]  Date of admission: 09/09/2022 Delivery date:09/11/2022 Delivering provider: Myrtie Hawk Date of discharge: 09/11/2022  Admitting diagnosis: Normal labor [O80, Z37.9] Intrauterine pregnancy: [redacted]w[redacted]d     Secondary diagnosis:  Principal Problem:   Vaginal delivery Active Problems:   Supervision of high-risk pregnancy   AMA (advanced maternal age) multigravida 35+   Asthma affecting pregnancy in second trimester   Echogenic intracardiac focus of fetus on prenatal ultrasound   Congenital hypoplasia of nasal bone   Anemia affecting pregnancy in third trimester  Additional problems: ***    Discharge diagnosis: Term Pregnancy Delivered                                              Post partum procedures:{Postpartum procedures:23558} Augmentation: AROM and Pitocin Complications: None  Hospital course: Onset of Labor With Vaginal Delivery      37 y.o. yo [id number] at [redacted]w[redacted]d was admitted in Latent Labor on 09/09/2022. Labor course was complicated by none.  Membrane Rupture Time/Date: 12:45 AM,09/11/2022  Delivery Method:Vaginal, Spontaneous Episiotomy: None Lacerations:  None Patient had a postpartum course complicated by ***.  She is ambulating, tolerating a regular diet, passing flatus, and urinating well. Patient is discharged home in stable condition on 09/11/22.  Newborn Data: Birth date:09/11/2022 Birth time:3:09 AM Gender:Female Living status:Living Apgars:9 ,  Weight:   Magnesium Sulfate received: {Mag received:30440022} BMZ received: No Rhophylac:N/A MMR:N/A T-DaP:*** Flu: N/A Transfusion:{Transfusion received:30440034}  Physical exam  Vitals:   09/11/22 0101 09/11/22 0133 09/11/22 0200 09/11/22 0231  BP: (!) 107/56 (!) 121/59 121/75 127/80  Pulse: 81 84 80 80  Resp:      Temp:  98.2 F (36.8 C)    TempSrc:  Oral    SpO2:       Weight:      Height:       General: {Exam; general:21111117} Lochia: {Desc; appropriate/inappropriate:30686::"appropriate"} Uterine Fundus: {Desc; firm/soft:30687} Incision: {Exam; incision:21111123} DVT Evaluation: {Exam; dvt:2111122} Labs: Lab Results  Component Value Date   WBC 8.1 09/10/2022   HGB 10.9 (L) 09/10/2022   HCT 33.8 (L) 09/10/2022   MCV 92.1 09/10/2022   PLT 140 (L) 09/10/2022      Latest Ref Rng & Units 01/11/2022    1:13 PM  CMP  Glucose 70 - 99 mg/dL 95   BUN 6 - 20 mg/dL 5   Creatinine 1.47 - 8.29 mg/dL 5.62   Sodium 130 - 865 mmol/L 137   Potassium 3.5 - 5.1 mmol/L 3.6   Chloride 98 - 111 mmol/L 102   CO2 22 - 32 mmol/L 25   Calcium 8.9 - 10.3 mg/dL 9.6   Total Protein 6.5 - 8.1 g/dL 7.9   Total Bilirubin 0.3 - 1.2 mg/dL 0.9   Alkaline Phos 38 - 126 U/L 46   AST 15 - 41 U/L 32   ALT 0 - 44 U/L 16    Edinburgh Score:    07/27/2016   10:14 AM  Edinburgh Postnatal Depression Scale Screening Tool  I have been able to laugh and see the funny side of things. 0  I have looked forward with enjoyment to things. 0  I have blamed myself unnecessarily when things went wrong. 0  I have been anxious or worried for no good reason.  1  I have felt scared or panicky for no good reason. 0  Things have been getting on top of me. 0  I have been so unhappy that I have had difficulty sleeping. 0  I have felt sad or miserable. 0  I have been so unhappy that I have been crying. 0  The thought of harming myself has occurred to me. 0  Edinburgh Postnatal Depression Scale Total 1     After visit meds:  Allergies as of 09/11/2022   No Known Allergies   Med Rec must be completed prior to using this [facility]***        Discharge home in stable condition Infant Feeding: {Baby feeding:23562} Infant Disposition:{CHL IP OB HOME WITH OZHYQM:57846} Discharge instruction: per After Visit Summary and Postpartum booklet. Activity: Advance as tolerated. Pelvic rest  for 6 weeks.  Diet: {OB NGEX:52841324} Future Appointments: Future Appointments  Date Time Provider Department Center  09/14/2022 10:35 AM Anyanwu, Jethro Bastos, MD [facility]  09/14/2022  3:00 PM Kathreen Cosier, LCSW [facility] None  09/21/2022  8:15 AM Federico Flake, MD [facility]  09/28/2022  8:55 AM Federico Flake, MD [facility]  09/28/2022  9:15 AM [facility] US[facility]   Follow up Visit: Message sent to [facility] 7/21  Please schedule this patient for a In person postpartum visit in 6 weeks with the following provider: Any provider. Additional Postpartum F/U: n/a   Low risk pregnancy complicated by:  n/a Delivery mode:  Vaginal, Spontaneous Anticipated Birth Control:   none   09/11/2022 Myrtie Hawk, DO

## 2022-09-12 MED ORDER — IRON (FERROUS SULFATE) 325 (65 FE) MG PO TABS: 1.0000 | ORAL_TABLET | ORAL | 3 refills | Status: AC

## 2022-09-12 MED ORDER — IBUPROFEN 600 MG PO TABS: 600.0000 mg | ORAL_TABLET | Freq: Four times a day (QID) | ORAL | 0 refills | Status: AC

## 2022-09-12 MED ORDER — ACETAMINOPHEN 325 MG PO TABS: 650.0000 mg | ORAL_TABLET | ORAL | 1 refills | Status: AC | PRN

## 2022-09-12 NOTE — Social Work (Signed)
CSW received consult for hx of Anxiety.  CSW met with MOB to offer support and complete assessment. CSW entered the room and observed MOB resting in bed and MOB's partner holding he infant at bedside. CSW introduced self, CSW role and reason for visit. MOB was agreeable to visit and allowed her partner to remain in the room. CSW inquired about how MOB was feeling, MOB reported good but ready to go home. CSW verbalized understanding. CSW inquired about MOB MH hx, MOB reported she has only suffered from anxiety throughout this pregnancy and reported no concerns moving forward. CSW assessed for safety, MOB denied nay SI or HI. CSW provided education regarding the baby blues period vs. perinatal mood disorders, discussed treatment and gave resources for mental health follow up if concerns arise.  CSW recommends self-evaluation during the postpartum time period using the New Mom Checklist from Postpartum Progress and encouraged MOB to contact a medical professional if symptoms are noted at any time.  MOB identified her partner as her support.   CSW provided review of Sudden Infant Death Syndrome (SIDS) precautions.  MOB identified [facility] for infants follow up care. MOB reported she has all necessary items for the infant including a bassinet and car seat.  CSW identifies no further need for intervention and no barriers to discharge at this time.  Wende Neighbors, LCSWA Clinical Social Worker [phone]

## 2022-09-14 ENCOUNTER — Ambulatory Visit: Payer: Medicaid Other | Admitting: Licensed Clinical Social Worker

## 2022-09-14 ENCOUNTER — Telehealth: Payer: Self-pay | Admitting: Licensed Clinical Social Worker

## 2022-09-14 ENCOUNTER — Encounter: Payer: Medicaid Other | Admitting: Obstetrics & Gynecology

## 2022-09-14 NOTE — Telephone Encounter (Signed)
Patient no showed to virtual appt. LCSW attempted to call patient and lvm encouraging patient to join the session and or to call to reschedule the appointment.

## 2022-09-21 ENCOUNTER — Encounter: Payer: Medicaid Other | Admitting: Family Medicine

## 2022-09-26 ENCOUNTER — Telehealth: Payer: Self-pay | Admitting: Licensed Clinical Social Worker

## 2022-09-26 ENCOUNTER — Encounter: Payer: Self-pay | Admitting: *Deleted

## 2022-09-26 NOTE — Telephone Encounter (Signed)
-----   Message from Kathreen Cosier sent at 09/14/2022  4:40 PM EDT ----- Regarding: missed appt. Patient missed appointment, needs follow up.

## 2022-09-28 ENCOUNTER — Encounter: Payer: Medicaid Other | Admitting: Family Medicine

## 2022-09-28 ENCOUNTER — Other Ambulatory Visit: Payer: Medicaid Other

## 2022-10-05 ENCOUNTER — Ambulatory Visit: Payer: Medicaid Other | Admitting: Licensed Clinical Social Worker

## 2022-10-05 DIAGNOSIS — F4322 Adjustment disorder with anxiety: Secondary | ICD-10-CM

## 2022-10-05 NOTE — Progress Notes (Signed)
Counselor/Therapist Progress Note  Patient ID: Sarah Reese, MRN: [id number],    Date: 10/05/2022  Time Spent: 40 minutes    Treatment Type: Psychotherapy  Reported Symptoms:  Stress; secondary to relationship challenges   Mental Status Exam:  Appearance:   NA     Behavior:  Appropriate, Sharing, and Motivated  Motor:  Normal  Speech/Language:   Clear and Coherent and Normal Rate  Affect:  NA  Mood:  normal  Thought process:  normal  Thought content:    WNL  Sensory/Perceptual disturbances:    WNL  Orientation:  oriented to person, place, time/date, situation, and day of week  Attention:  Good  Concentration:  Good  Memory:  WNL  Fund of knowledge:   Good  Insight:    Fair  Judgment:   Fair  Impulse Control:  Fair   Risk Assessment: Danger to Self:  No Self-injurious Behavior: No Danger to Others: No Duty to Warn:no Physical Aggression / Violence:No  Access to Firearms a concern: No  Gang Involvement:No   Subjective: Patient was receptive to feedback and intervention from LCSW and actively and effectively participated throughout the session sharing thoughts and feelings. Patient reports that things have been good until the last two days following relationship problems with girlfriend. However, she continues to report she is overall stable- tired from getting up with baby but other then that she reports she is okay, Patient reports continued motivation for treatment.   Interventions: Cognitive Behavioral Therapy and Client Centered  Established psychological safety. Clinician met with patient to identify needs related to stressors and functioning, and assess and monitor for mental health signs and symptoms, and assess safety. The clinician processed with the patient how they have been doing since the last follow-up session. LCSW assisted patient in processing their thoughts and emotions about what they have experienced in relationship with girlfriend/ex. Joined with  patient validating her emotions. LCSW highlighted her relationship values and the incongruence in her current relationship. Provided support through active listening, validation of feelings, and highlighted patient's strengths.    Diagnosis:   ICD-10-CM   1. Adjustment disorder with anxious mood  F43.22      Plan: Patient's goal of treatment is to learn how to forgive people and to work on anger management.    --assess relationship challenges -build relationship  -Bringing closure to their past disputes -teach effective communication -explore attachment styles  -assist patient and her partner in developing understanding and support of each others needs -Repairing their relationship -Identifying the root causes of their disputes -Helping them understand their partner's point of view -Developing trust with each partner without alienating one or the other -Teaching them how their actions contribute to conflicts   Treatment Target: Understand the relationship between thoughts, emotions, and behaviors  Psychoeducation on CBTs model   Oriented the client to the therapeutic approach Teach the connection between thoughts, emotions, and behaviors  Treatment Target: Reducing vulnerability to "emotional mind" Values clarification   Self-care - nutrition, sleep, exercise  Treatment Target: Increase emotional regulation  Teach distress tolerance techniques - "what helps me"  Opposite action PLEASE  skills or self-care skills  Self-soothing  Cope ahead skills - imagery, rehearsal, problem-solving, exposure   Assertiveness communication   Future Appointments  Date Time Provider Department Center  10/12/2022  1:00 PM Kathreen Cosier, LCSW [facility] None     Kathreen Cosier, LCSW

## 2022-10-10 ENCOUNTER — Telehealth (HOSPITAL_COMMUNITY): Payer: Self-pay | Admitting: *Deleted

## 2022-10-10 NOTE — Telephone Encounter (Signed)
10/10/2022  Name: Patrizia Cowley MRN: [id number] DOB: 1986/02/14  Reason for Call:  Transition of Care Hospital Discharge Call  Contact Status: Patient Contact Status:  (incomplete)  Language assistant needed: Interpreter Mode: Interpreter Not Needed        Follow-Up Questions: Do You Have Any Concerns About Your Health As You Heal From Delivery?: No Do You Have Any Concerns About Your Infants Health?: No  Edinburgh Postnatal Depression Scale:  In the Past 7 Days:  Patient said that she was napping at time of call and requests a call back later to complete EPDS.  PHQ2-9 Depression Scale:     Discharge Follow-up:    Post-discharge interventions: NA  Salena Saner, RN 10/10/2022 10:33

## 2022-10-10 NOTE — Telephone Encounter (Signed)
10/10/2022  Name: Vallen Teer MRN: [id number] DOB: [birth year]  Reason for Call:  Transition of Care Hospital Discharge Call  Contact Status: Patient Contact Status: Unable to contact  Patient answered the phone and hung up when I introduced myself. Language assistant needed:          Follow-Up Questions:    Inocente Salles Postnatal Depression Scale:  In the Past 7 Days:    PHQ2-9 Depression Scale:     Discharge Follow-up:    Post-discharge interventions: NA  Salena Saner, RN 10/10/2022 14:24

## 2022-10-12 ENCOUNTER — Ambulatory Visit: Payer: Medicaid Other | Admitting: Licensed Clinical Social Worker

## 2022-10-12 DIAGNOSIS — F4322 Adjustment disorder with anxiety: Secondary | ICD-10-CM

## 2022-10-12 NOTE — Progress Notes (Signed)
Counselor/Therapist Progress Note  Patient ID: Sarah Reese, MRN: [id number],    Date: 10/12/2022  Time Spent: 42 minutes    Treatment Type: Individual Therapy  Reported Symptoms:  Mood stability   Mental Status Exam:  Appearance:   NA     Behavior:  Appropriate, Sharing, and Motivated  Motor:  NA  Speech/Language:   Clear and Coherent and Normal Rate  Affect:  Appropriate, Congruent, and Full Range  Mood:  normal  Thought process:  normal  Thought content:    WNL  Sensory/Perceptual disturbances:    WNL  Orientation:  oriented to person, place, time/date, situation, and day of week  Attention:  Good  Concentration:  Good  Memory:  WNL  Fund of knowledge:   Good  Insight:    Fair  Judgment:   Fair  Impulse Control:  Fair   Risk Assessment: Danger to Self:  No Self-injurious Behavior: No Danger to Others: No Duty to Warn:no Physical Aggression / Violence:No  Access to Firearms a concern: No  Gang Involvement:No   Subjective: Patient was engaged and cooperative throughout the session using time effectively to discuss thoughts and feelings. Patient reports that she has been "fine" she reports she is enjoying working and reports improvement in relationship communication. Patient was receptive to feedback and intervention from LCSW.   Interventions: Cognitive Behavioral Therapy and Client Centered  Established psychological safety. Checked in with patient and processed with the patient how they have been doing since the last follow-up session. LCSW assisted patient in processing their thoughts and emotions regarding adjusting to new baby, romantic relationship, and family of origin issues. LCSW offered reflective listening and compassionate presence, discussed self-care, sleep hygiene, and gratitude. Provided support through active listening, validation of feelings, and highlighted patient's strengths.  Diagnosis:   ICD-10-CM   1. Adjustment disorder with anxious  mood  F43.22      Plan: Patient's goal of treatment is to learn how to forgive people and to work on anger management.    --assess relationship challenges -build relationship  -Bringing closure to their past disputes -teach effective communication -explore attachment styles  -assist patient and her partner in developing understanding and support of each others needs -Repairing their relationship -Identifying the root causes of their disputes -Helping them understand their partner's point of view -Developing trust with each partner without alienating one or the other -Teaching them how their actions contribute to conflicts   Treatment Target: Understand the relationship between thoughts, emotions, and behaviors  Psychoeducation on CBTs model   Oriented the client to the therapeutic approach Teach the connection between thoughts, emotions, and behaviors  Treatment Target: Reducing vulnerability to "emotional mind" Values clarification   Self-care - nutrition, sleep, exercise  Treatment Target: Increase emotional regulation  Teach distress tolerance techniques - "what helps me"  Opposite action PLEASE  skills or self-care skills  Self-soothing  Cope ahead skills - imagery, rehearsal, problem-solving, exposure   Assertiveness communication   Future Appointments  Date Time Provider Department Center  10/20/2022  1:00 PM Kathreen Cosier, LCSW [facility] None     Kathreen Cosier, LCSW

## 2022-10-17 ENCOUNTER — Encounter: Payer: Self-pay | Admitting: *Deleted

## 2022-10-20 ENCOUNTER — Ambulatory Visit: Payer: Medicaid Other | Admitting: Licensed Clinical Social Worker

## 2022-10-31 ENCOUNTER — Encounter: Payer: Self-pay | Admitting: *Deleted

## 2022-11-08 ENCOUNTER — Ambulatory Visit: Payer: Medicaid Other | Admitting: Licensed Clinical Social Worker

## 2022-11-08 DIAGNOSIS — F4322 Adjustment disorder with anxiety: Secondary | ICD-10-CM

## 2022-11-08 NOTE — Progress Notes (Signed)
Counselor/Therapist Progress Note  Patient ID: Sarah Reese, MRN: [id number],    Date: 11/08/2022  Time Spent: 47 minutes    Treatment Type: Individual Therapy (patient's partner joined last 7 minutes of the session)   Reported Symptoms:  Been good  Mental Status Exam:  Appearance:   NA     Behavior:  Appropriate, Sharing, and Motivated  Motor:  NA  Speech/Language:   Clear and Coherent and Normal Rate  Affect:  NA  Mood:  normal  Thought process:  normal  Thought content:    WNL  Sensory/Perceptual disturbances:    WNL  Orientation:  oriented to person, place, time/date, situation, and day of week  Attention:  Good  Concentration:  Good  Memory:  WNL  Fund of knowledge:   Good  Insight:    Fair  Judgment:   Fair  Impulse Control:  Fair   Risk Assessment: Danger to Self:  No Self-injurious Behavior: No Danger to Others: No Duty to Warn:no Physical Aggression / Violence:No  Access to Firearms a concern: No  Gang Involvement:No   Subjective: Patient was receptive to feedback and intervention from LCSW and actively and effectively participated throughout the session. Patient reports that she has been good and things have overall been good. She reports that she feels like since therapy she is communicating better in relationship. Patient reports continued motivated for treatment.   Interventions: Cognitive Behavioral Therapy and Client Center  Established psychological safety. Checked in with patient and processed with the patient how they have been doing since the last follow-up session. Explored patient's perceptions of relationship challenges and balancing her values of being a supportive partner and a good mom, reviewing communication, reframing unhelpful thoughts, and reviewing prospective taking. LCSW encouraged patient to continue journaling and working towards living within her values. Provided support through active listening, validation of feelings, and  highlighted patient's strengths.  Diagnosis:   ICD-10-CM   1. Adjustment disorder with anxious mood  F43.22      Plan: Patient's goal of treatment is to learn how to forgive people and to work on anger management.    --assess relationship challenges -build relationship  -Bringing closure to their past disputes -teach effective communication -explore attachment styles  -assist patient and her partner in developing understanding and support of each others needs -Repairing their relationship -Identifying the root causes of their disputes -Helping them understand their partner's point of view -Developing trust with each partner without alienating one or the other -Teaching them how their actions contribute to conflicts   Treatment Target: Understand the relationship between thoughts, emotions, and behaviors  Psychoeducation on CBTs model   Oriented the client to the therapeutic approach Teach the connection between thoughts, emotions, and behaviors  Treatment Target: Reducing vulnerability to "emotional mind" Values clarification   Self-care - nutrition, sleep, exercise  Treatment Target: Increase emotional regulation  Teach distress tolerance techniques - "what helps me"  Opposite action PLEASE  skills or self-care skills  Self-soothing  Cope ahead skills - imagery, rehearsal, problem-solving, exposure   Assertiveness communication  Future Appointments  Date Time Provider Department Center  11/15/2022  9:00 AM Kathreen Cosier, LCSW [facility] None    Kathreen Cosier, LCSW

## 2022-11-15 ENCOUNTER — Ambulatory Visit: Payer: Medicaid Other | Admitting: Licensed Clinical Social Worker

## 2022-11-15 DIAGNOSIS — F4322 Adjustment disorder with anxiety: Secondary | ICD-10-CM

## 2022-11-15 NOTE — Progress Notes (Signed)
Counselor/Therapist Progress Note  Patient ID: Sarah Reese, MRN: [id number],    Date: 11/15/2022  Time Spent: 40 minutes    Treatment Type: Individual Therapy  Reported Symptoms:  Irritability, stress due to relationship conflict  Mental Status Exam:  Appearance:   NA     Behavior:  Appropriate, Sharing, and Motivated  Motor:  NA  Speech/Language:   Clear and Coherent and Normal Rate  Affect:  NA  Mood:  irritable  Thought process:  normal  Thought content:    WNL  Sensory/Perceptual disturbances:    WNL  Orientation:  oriented to person, place, time/date, situation, and day of week  Attention:  Good  Concentration:  Good  Memory:  WNL  Fund of knowledge:   Good  Insight:    Fair  Judgment:   Fair  Impulse Control:  Fair   Risk Assessment: Danger to Self:  No Self-injurious Behavior: No Danger to Others: No Duty to Warn:no Physical Aggression / Violence:No  Access to Firearms a concern: No  Gang Involvement:No   Subjective: Patient was engaged and cooperative throughout the session using time effectively to discuss thoughts and feelings. Patient reports that she has had a lot of relationship challenges leading to 2 episodes of old behaviors- yelling, screaming, slamming doors. Patient voices awareness of what she needs to do to prevent these episodes. Patient was receptive to feedback and intervention and reports continued motivation for treatment.    Interventions: Cognitive Behavioral Therapy and Client centered  Established psychological safety. Checked in with patient regarding their week. LCSW processed with the patient how they have been doing since the last follow-up session. LCSW assisted patient in processing their thoughts and emotions regarding relationship conflict. LCSW highlighted and reframed unhelpful thoughts leading to distress. LCSW reviewed STOP, journaling, and increasing self-care to manage distressing emotions/frustration. Provided support  through active listening, validation of feelings, and highlighted patient's strengths.  Diagnosis:   ICD-10-CM   1. Adjustment disorder with anxious mood  F43.22      Plan: Patient's goal of treatment is to learn how to forgive people and to work on anger management.    --assess relationship challenges -build relationship  -Bringing closure to their past disputes -teach effective communication -explore attachment styles  -assist patient and her partner in developing understanding and support of each others needs -Repairing their relationship -Identifying the root causes of their disputes -Helping them understand their partner's point of view -Developing trust with each partner without alienating one or the other -Teaching them how their actions contribute to conflicts   Treatment Target: Understand the relationship between thoughts, emotions, and behaviors  Psychoeducation on CBTs model   Oriented the client to the therapeutic approach Teach the connection between thoughts, emotions, and behaviors  Treatment Target: Reducing vulnerability to "emotional mind" Values clarification   Self-care - nutrition, sleep, exercise  Treatment Target: Increase emotional regulation  Teach distress tolerance techniques - "what helps me"  Opposite action PLEASE  skills or self-care skills  Self-soothing  Cope ahead skills - imagery, rehearsal, problem-solving, exposure   Assertiveness communication  Future Appointments  Date Time Provider Department Center  11/21/2022 10:50 AM Kathreen Cosier, LCSW [facility] None    Kathreen Cosier, LCSW

## 2022-11-21 ENCOUNTER — Ambulatory Visit: Payer: Medicaid Other | Admitting: Licensed Clinical Social Worker

## 2022-11-21 DIAGNOSIS — F4322 Adjustment disorder with anxiety: Secondary | ICD-10-CM

## 2022-11-21 NOTE — Progress Notes (Signed)
Counselor/Therapist Progress Note  Patient ID: Sarah Reese, MRN: [id number],    Date: 11/21/2022  Time Spent: 50 minutes including face to face and pre and post planning and follow up   Treatment Type: Psychotherapy patient's partner participated in a portion of the session per patient's request   Reported Symptoms:  mood has been "alright"   Mental Status Exam:  Appearance:   NA     Behavior:  Appropriate, Sharing, and Motivated  Motor:  NA  Speech/Language:   Clear and Coherent and Normal Rate  Affect:  NA  Mood:  normal  Thought process:  normal  Thought content:    WNL  Sensory/Perceptual disturbances:    WNL  Orientation:  oriented to person, place, time/date, situation, and day of week  Attention:  Good  Concentration:  Good  Memory:  WNL  Fund of knowledge:   Good  Insight:    Fair  Judgment:   Fair  Impulse Control:  Fair   Risk Assessment: Danger to Self:  No Self-injurious Behavior: No Danger to Others: No Duty to Warn:no Physical Aggression / Violence:No  Access to Firearms a concern: No  Gang Involvement:No   Subjective: Patient was engaged and cooperative throughout the session using time effectively to discuss thoughts and feelings. Patient reports her mood has been fine, but she has continued to have relationship challenges. She reports Journaling to manage her symptoms. She also reports benefit from perspective taking. . Patient was receptive to feedback and intervention from LCSW.   Interventions: Cognitive Behavioral Therapy and Client Centered  Established psychological safety. Checked in with patient regarding their week. LCSW processed with the patient how they have been doing since the last follow-up session. LCSW assisted patient in processing their thoughts and emotions regarding ongoing challenges in relationship identifying points of intervention.LCSW provided psychoeducation on triggers and discussed ways to manage triggers, including  mindfulness, noticing what is showing up and self-soothing and lastly discussed ways to communicate more effectively. Provided support through active listening, validation of feelings, and highlighted patient's strengths.   Diagnosis:   ICD-10-CM   1. Adjustment disorder with anxious mood  F43.22      Plan: Patient's goal of treatment is to learn how to forgive people and to work on anger management.   --assess relationship challenges -build relationship  -Bringing closure to their past disputes -teach effective communication -explore attachment styles  -assist patient and her partner in developing understanding and support of each others needs -Repairing their relationship -Identifying the root causes of their disputes -Helping them understand their partner's point of view -Developing trust with each partner without alienating one or the other -Teaching them how their actions contribute to conflicts   Treatment Target: Understand the relationship between thoughts, emotions, and behaviors  Psychoeducation on CBTs model   Oriented the client to the therapeutic approach Teach the connection between thoughts, emotions, and behaviors  Treatment Target: Reducing vulnerability to "emotional mind" Values clarification   Self-care - nutrition, sleep, exercise  Treatment Target: Increase emotional regulation  Teach distress tolerance techniques - "what helps me"  Opposite action PLEASE  skills or self-care skills  Self-soothing  Cope ahead skills - imagery, rehearsal, problem-solving, exposure   Assertiveness communication  Future Appointments  Date Time Provider Department Center  11/29/2022 11:00 AM Kathreen Cosier, LCSW [facility] None    Kathreen Cosier, [place]

## 2022-11-29 ENCOUNTER — Ambulatory Visit: Payer: Medicaid Other | Admitting: Licensed Clinical Social Worker

## 2022-11-29 DIAGNOSIS — F4322 Adjustment disorder with anxiety: Secondary | ICD-10-CM

## 2022-11-29 NOTE — Progress Notes (Signed)
Counselor/Therapist Progress Note  Patient ID: Sarah Reese, MRN: [id number],    Date: 11/29/2022  Time Spent: 32 minutes  Patient had to end session early   Treatment Type: Individual Therapy  Reported Symptoms:  "tired mentally" due to relationship challenges   Mental Status Exam:  Appearance:   NA     Behavior:  Appropriate, Sharing, and Motivated  Motor:  NA  Speech/Language:   Clear and Coherent and Normal Rate  Affect:  NA  Mood:  dysthymic  Thought process:  normal  Thought content:    WNL  Sensory/Perceptual disturbances:    WNL  Orientation:  oriented to person, place, time/date, situation, and day of week  Attention:  Good  Concentration:  Good  Memory:  WNL  Fund of knowledge:   Good  Insight:    Fair  Judgment:   Fair  Impulse Control:  Fair   Risk Assessment: Danger to Self:  No Self-injurious Behavior: No Danger to Others: No Duty to Warn:no Physical Aggression / Violence:No  Access to Firearms a concern: No  Gang Involvement:No   Subjective: Patient was receptive to feedback and intervention from LCSW and actively and effectively participated throughout the session. Patient reports she is mentally tired of all the stress in relationship. Patient reports benefit from regular sessions and would like to have two sessions a week. Patient voices understanding that LCSW is unable to see her 2x per week at this time.   Interventions: Cognitive Behavioral Therapy and client centered  Established psychological safety. LCSW met with patient to identify needs related to stressors and functioning, and assess and monitor mental health signs and symptoms, and assess safety. Checked in with patient regarding their week. Provided supportive space for patient to verbally ventilate thoughts and feelings related to relationship conflict. LCSW validated patient's feelings, reframed unhelpful thoughts leading to increase distress, and assisted patient in weighing pros  and cons of open communication. Provided support through active listening, validation of feelings, and highlighted patient's strengths.   Diagnosis:   ICD-10-CM   1. Adjustment disorder with anxious mood  F43.22      Plan:  Patient's goal of treatment is to learn how to forgive people and to work on anger management.   --assess relationship challenges -build relationship  -Bringing closure to their past disputes -teach effective communication -explore attachment styles  -assist patient and her partner in developing understanding and support of each others needs -Repairing their relationship -Identifying the root causes of their disputes -Helping them understand their partner's point of view -Developing trust with each partner without alienating one or the other -Teaching them how their actions contribute to conflicts   Treatment Target: Understand the relationship between thoughts, emotions, and behaviors  Psychoeducation on CBTs model   Oriented the client to the therapeutic approach Teach the connection between thoughts, emotions, and behaviors  Treatment Target: Reducing vulnerability to "emotional mind" Values clarification   Self-care - nutrition, sleep, exercise  Treatment Target: Increase emotional regulation  Teach distress tolerance techniques - "what helps me"  Opposite action PLEASE  skills or self-care skills  Self-soothing  Cope ahead skills - imagery, rehearsal, problem-solving, exposure   Assertiveness communication Future Appointments  Date Time Provider Department Center  12/06/2022 11:00 AM Kathreen Cosier, LCSW [facility] None     Kathreen Cosier, LCSW

## 2022-12-06 ENCOUNTER — Ambulatory Visit: Payer: Medicaid Other | Admitting: Licensed Clinical Social Worker

## 2022-12-06 DIAGNOSIS — F4322 Adjustment disorder with anxiety: Secondary | ICD-10-CM

## 2022-12-06 NOTE — Progress Notes (Signed)
Counselor/Therapist Progress Note  Patient ID: Sarah Reese, MRN: [id number],    Date: 12/06/2022  Time Spent: 48 minutes    Treatment Type: Psychotherapy  Reported Symptoms:  Overall stable mood; stress related to relationship challenges   Mental Status Exam:  Appearance:   NA     Behavior:  Appropriate, Sharing, and Motivated  Motor:  NA  Speech/Language:   Clear and Coherent and Normal Rate  Affect:  NA  Mood:  normal  Thought process:  normal  Thought content:    WNL  Sensory/Perceptual disturbances:    WNL  Orientation:  oriented to person, place, time/date, situation, and day of week  Attention:  Good  Concentration:  Good  Memory:  WNL  Fund of knowledge:   Good  Insight:    Fair  Judgment:   Fair  Impulse Control:  Fair   Risk Assessment: Danger to Self:  No Self-injurious Behavior: No Danger to Others: No Duty to Warn:no Physical Aggression / Violence:No  Access to Firearms a concern: No  Gang Involvement:No   Subjective: Patient was engaged and cooperative throughout the session using time effectively to discuss thoughts and feelings. Patient reports that her mood is overall good, she feels stressed when she is having relationship conflict. Patient was receptive to feedback and intervention from LCSW. Patient voices continued motivation for treatment.   Interventions: Cognitive Behavioral Therapy and Client Centered  Established psychological safety.  Checked in with patient about her week. Engaged patient in processing current psychosocial stressors, stress related to relationship conflict. LCSW validated patient's feelings and validated her behavior of walking away from an argument. LCSW reframed unhelpful thoughts leading to distress and created flexibility in thinking through cognitive diffusion exercises. Provided support through active listening, validation of feelings, and highlighted patient's strengths.   Diagnosis:   ICD-10-CM   1.  Adjustment disorder with anxious mood  F43.22      Plan:  Patient's goal of treatment is to learn how to forgive people and to work on anger management.   --assess relationship challenges -build relationship  -Bringing closure to their past disputes -teach effective communication -explore attachment styles  -assist patient and her partner in developing understanding and support of each others needs -Repairing their relationship -Identifying the root causes of their disputes -Helping them understand their partner's point of view -Developing trust with each partner without alienating one or the other -Teaching them how their actions contribute to conflicts   Treatment Target: Understand the relationship between thoughts, emotions, and behaviors  Psychoeducation on CBTs model   Oriented the client to the therapeutic approach Teach the connection between thoughts, emotions, and behaviors  Treatment Target: Reducing vulnerability to "emotional mind" Values clarification   Self-care - nutrition, sleep, exercise  Treatment Target: Increase emotional regulation  Teach distress tolerance techniques - "what helps me"  Opposite action PLEASE  skills or self-care skills  Self-soothing  Cope ahead skills - imagery, rehearsal, problem-solving, exposure   Assertiveness communication  Future Appointments  Date Time Provider Department Center  12/13/2022 11:00 AM Kathreen Cosier, LCSW [facility] None  12/22/2022  8:30 AM Kathreen Cosier, LCSW [facility] None    Kathreen Cosier, LCSW

## 2022-12-13 ENCOUNTER — Ambulatory Visit: Payer: Medicaid Other | Admitting: Licensed Clinical Social Worker

## 2022-12-13 DIAGNOSIS — F4322 Adjustment disorder with anxiety: Secondary | ICD-10-CM

## 2022-12-13 NOTE — Progress Notes (Signed)
Counselor/Therapist Progress Note  Patient ID: Sarah Reese, MRN: [id number],    Date: 12/13/2022  Time Spent: 45 minutes  Treatment Type: Individual Therapy  Reported Symptoms:  irritability, stress secondary to relationship conflict   Mental Status Exam:  Appearance:   NA     Behavior:  Appropriate, Sharing, and Motivated  Motor:  Normal  Speech/Language:   Clear and Coherent and Normal Rate  Affect:  NA  Mood:  normal  Thought process:  normal  Thought content:    WNL  Sensory/Perceptual disturbances:    WNL  Orientation:  oriented to person, place, time/date, situation, and day of week  Attention:  Good  Concentration:  Good  Memory:  WNL  Fund of knowledge:   Good  Insight:    Fair  Judgment:   Fair  Impulse Control:  Fair   Risk Assessment: Danger to Self:  No Self-injurious Behavior: No Danger to Others: No Duty to Warn:no Physical Aggression / Violence:No  Access to Firearms a concern: No  Gang Involvement:No   Subjective: Patient was engaged and cooperative throughout the session using time effectively to discuss thoughts and feelings. Patient reports she is irritable and her mood is affected by relationship challenges with intimate partner. Patient was receptive to feedback and intervention from LCSW.   Interventions: Cognitive Behavioral Therapy and Client Centered  Established psychological safety. Checked in with patient regarding their week. LCSW processed with the patient how they have been doing since the last follow-up session. The session primarily focused on challenges/conflict in relationship. LCSW validated patient's feelings of frustration and hurt, reframed thoughts leading to increase distress, reviewed communication strategies, and encouraged patient to increase self-care and set appropriate boundaries to care for herself.   Diagnosis:   ICD-10-CM   1. Adjustment disorder with anxious mood  F43.22      Plan:  Patient's goal of  treatment is to learn how to forgive people and to work on anger management.   --assess relationship challenges -build relationship  -Bringing closure to their past disputes -teach effective communication -explore attachment styles  -assist patient and her partner in developing understanding and support of each others needs -Repairing their relationship -Identifying the root causes of their disputes -Helping them understand their partner's point of view -Developing trust with each partner without alienating one or the other -Teaching them how their actions contribute to conflicts   Treatment Target: Understand the relationship between thoughts, emotions, and behaviors  Psychoeducation on CBTs model   Oriented the client to the therapeutic approach Teach the connection between thoughts, emotions, and behaviors  Treatment Target: Reducing vulnerability to "emotional mind" Values clarification   Self-care - nutrition, sleep, exercise  Treatment Target: Increase emotional regulation  Teach distress tolerance techniques - "what helps me"  Opposite action PLEASE  skills or self-care skills  Self-soothing  Cope ahead skills - imagery, rehearsal, problem-solving, exposure   Assertiveness communication  Future Appointments  Date Time Provider Department Center  12/21/2022  1:00 PM Kathreen Cosier, LCSW [facility] None    Kathreen Cosier, LCSW

## 2022-12-21 ENCOUNTER — Ambulatory Visit: Payer: Medicaid Other | Admitting: Licensed Clinical Social Worker

## 2022-12-21 DIAGNOSIS — F4322 Adjustment disorder with anxiety: Secondary | ICD-10-CM

## 2022-12-21 NOTE — Progress Notes (Signed)
Counselor/Therapist Progress Note  Patient ID: Sarah Reese, MRN: [id number],    Date: 12/21/2022  Time Spent: 45 minutes    Treatment Type: Psychotherapy Patient and her partner was present in the session  Reported Symptoms:  Anxiety, irritability, secondary to relationship conflict   Mental Status Exam:  Appearance:   Casual and Neat     Behavior:  Appropriate, Sharing, Agitated, Motivated, and Assertive  Motor:  Normal  Speech/Language:   Clear and Coherent and Normal Rate  Affect:  Appropriate, Congruent, and Full Range  Mood:  irritable  Thought process:  normal  Thought content:    WNL  Sensory/Perceptual disturbances:    WNL  Orientation:  oriented to person, place, time/date, situation, and day of week  Attention:  Good  Concentration:  Good  Memory:  WNL  Fund of knowledge:   Good  Insight:    Fair  Judgment:   Fair  Impulse Control:  Fair   Risk Assessment: Danger to Self:  No Self-injurious Behavior: No Danger to Others: No Duty to Warn:no Physical Aggression / Violence:No  Access to Firearms a concern: No  Gang Involvement:No   Subjective: Patient presents with her partner reporting lots of frustration due to conflict in the relationship. Patient was engaged and cooperative throughout the session using time effectively to discuss relationship challenges.  Patient was receptive to feedback and intervention from LCSW and voices continued motivation for treatment.   Interventions: Cognitive Behavioral Therapy and Client Centered  Checked in with patient regarding their week. LCSW assisted patient and her partner in processing their thoughts and emotions about what they have experienced over the past week in regards to communication challenges and conflicting values and boundaries. LCSW facilitated communication between patient and her partner; highlighted differing perspectives, and reviewed effective communication. Provided support through active  listening, validation of feelings, and highlighted patient's strengths.   Diagnosis:   ICD-10-CM   1. Adjustment disorder with anxious mood  F43.22       Plan:  Patient's goal of treatment is to learn how to forgive people and to work on anger management.   --assess relationship challenges -build relationship  -Bringing closure to their past disputes -teach effective communication -explore attachment styles  -assist patient and her partner in developing understanding and support of each others needs -Repairing their relationship -Identifying the root causes of their disputes -Helping them understand their partner's point of view -Developing trust with each partner without alienating one or the other -Teaching them how their actions contribute to conflicts   Treatment Target: Understand the relationship between thoughts, emotions, and behaviors  Psychoeducation on CBTs model   Oriented the client to the therapeutic approach Teach the connection between thoughts, emotions, and behaviors  Treatment Target: Reducing vulnerability to "emotional mind" Values clarification   Self-care - nutrition, sleep, exercise  Treatment Target: Increase emotional regulation  Teach distress tolerance techniques - "what helps me"  Opposite action PLEASE  skills or self-care skills  Self-soothing  Cope ahead skills - imagery, rehearsal, problem-solving, exposure   Assertiveness communication  Future Appointments  Date Time Provider Department Center  12/26/2022 10:00 AM Kathreen Cosier, LCSW [facility] None    Kathreen Cosier, LCSW

## 2022-12-22 ENCOUNTER — Ambulatory Visit: Payer: Medicaid Other | Admitting: Licensed Clinical Social Worker

## 2022-12-26 ENCOUNTER — Ambulatory Visit: Payer: Medicaid Other | Admitting: Licensed Clinical Social Worker

## 2022-12-26 DIAGNOSIS — F4322 Adjustment disorder with anxiety: Secondary | ICD-10-CM

## 2022-12-26 NOTE — Progress Notes (Signed)
Counselor/Therapist Progress Note  Patient ID: Sarah Reese, MRN: [id number],    Date: 12/26/2022  Time Spent: 45 minutes    Treatment Type: Psychotherapy Patient's partner participated in part of the session.   Reported Symptoms:  Stable mood   Mental Status Exam:  Appearance:   NA     Behavior:  Appropriate, Sharing, and Motivated  Motor:  NA  Speech/Language:   Clear and Coherent and Normal Rate  Affect:  NA  Mood:  normal  Thought process:  normal  Thought content:    WNL  Sensory/Perceptual disturbances:    WNL  Orientation:  oriented to person, place, time/date, situation, and day of week  Attention:  Good  Concentration:  Good  Memory:  WNL  Fund of knowledge:   Good  Insight:    Fair  Judgment:   Fair  Impulse Control:  Fair   Risk Assessment: Danger to Self:  No Self-injurious Behavior: No Danger to Others: No Duty to Warn:no Physical Aggression / Violence:No  Access to Firearms a concern: No  Gang Involvement:No   Subjective: Patient was engaged and cooperative throughout the session using time effectively to discuss thoughts and feelings. Patient reports that overall the week has been okay and things are stable in her intimate partner relationship, but she continues to struggle with values not aligning related to time spent equally at patient's home. Patient was receptive to feedback and intervention from LCSW.   Interventions: Cognitive Behavioral Therapy and Client Centered  Established psychological safety. Checked in with patient regarding their week. LCSW processed with the patient how they have been doing since the last follow-up session. LCSW facilitated communication between patient and her partner about her needs/wants, encouraging perspective taking and building understanding. LCSW encouraged patient to notice frustration and resentment showing up and checking in with thoughts and reframing unhelpful thoughts.  Provided support through active  listening, validation of feelings, and highlighted patient's strengths.  Diagnosis:   ICD-10-CM   1. Adjustment disorder with anxious mood  F43.22      Plan: Patient's goal of treatment is to learn how to forgive people and to work on anger management.   --assess relationship challenges -build relationship  -Bringing closure to their past disputes -teach effective communication -explore attachment styles  -assist patient and her partner in developing understanding and support of each others needs -Repairing their relationship -Identifying the root causes of their disputes -Helping them understand their partner's point of view -Developing trust with each partner without alienating one or the other -Teaching them how their actions contribute to conflicts   Treatment Target: Understand the relationship between thoughts, emotions, and behaviors  Psychoeducation on CBTs model   Oriented the client to the therapeutic approach Teach the connection between thoughts, emotions, and behaviors  Treatment Target: Reducing vulnerability to "emotional mind" Values clarification   Self-care - nutrition, sleep, exercise  Treatment Target: Increase emotional regulation  Teach distress tolerance techniques - "what helps me"  Opposite action PLEASE  skills or self-care skills  Self-soothing  Cope ahead skills - imagery, rehearsal, problem-solving, exposure   Assertiveness communication  Future Appointments  Date Time Provider Department Center  01/03/2023 11:00 AM Kathreen Cosier, LCSW [facility] None    Kathreen Cosier, LCSW

## 2023-01-03 ENCOUNTER — Ambulatory Visit: Payer: Medicaid Other | Admitting: Licensed Clinical Social Worker

## 2023-01-03 DIAGNOSIS — F4322 Adjustment disorder with anxiety: Secondary | ICD-10-CM

## 2023-01-03 NOTE — Progress Notes (Signed)
Counselor/Therapist Progress Note  Patient ID: Sarah Reese, MRN: [id number],    Date: 01/03/2023  Time Spent: 45 minutes    Treatment Type: Psychotherapy  Reported Symptoms:  Current mood stable. Intermittent irritability, anger and worry over the past week   Mental Status Exam:  Appearance:   NA     Behavior:  Appropriate, Sharing, and Motivated  Motor:  NA  Speech/Language:   Clear and Coherent and Normal Rate  Affect:  NA  Mood:  normal  Thought process:  normal  Thought content:    WNL  Sensory/Perceptual disturbances:    WNL  Orientation:  oriented to person, place, time/date, situation, and day of week  Attention:  Good  Concentration:  Good  Memory:  WNL  Fund of knowledge:   Good  Insight:    Fair  Judgment:   Fair  Impulse Control:  Fair   Risk Assessment: Danger to Self:  No Self-injurious Behavior: No Danger to Others: No Duty to Warn:no Physical Aggression / Violence:No  Access to Firearms a concern: No  Gang Involvement:No   Subjective: Patient was engaged and cooperative throughout the session using time effectively to discuss thoughts and feelings. Patient reports that a lot has been going on over the past week. She shares that she was fired from her job and has felt angry about it and is also having continued relationship challenges. Patient voices continued motivation for treatment.  Interventions: Cognitive Behavioral Therapy and Client Centered  LCSW established psychological safety. LCSW met with patient to identify needs related to stressors and functioning, and assess and monitor for signs and symptoms of depression and anxiety, and assess safety. Checked in with patient regarding how they have been doing since the last follow-up session. LCSW assisted patient in processing their thoughts and emotions regarding loss of job and continued relationship conflict. Validated patient's feelings of anger and offered reflective listening and  compassionate presence. Reviewed values living, noticing physical sensations in the body, serving hands, and journaling. LCSW and patient reviewed crisis line 988 to use, if needed while LCSW is on planned vacation next week. Provided support through active listening, validation of feelings, and highlighted patient's strengths.   Diagnosis:   ICD-10-CM   1. Adjustment disorder with anxious mood  F43.22      Plan: Patient's goal of treatment is to learn how to forgive people and to work on anger management.   --assess relationship challenges -build relationship  -Bringing closure to their past disputes -teach effective communication -explore attachment styles  -assist patient and her partner in developing understanding and support of each others needs -Repairing their relationship -Identifying the root causes of their disputes -Helping them understand their partner's point of view -Developing trust with each partner without alienating one or the other -Teaching them how their actions contribute to conflicts   Treatment Target: Understand the relationship between thoughts, emotions, and behaviors  Psychoeducation on CBTs model   Oriented the client to the therapeutic approach Teach the connection between thoughts, emotions, and behaviors  Treatment Target: Reducing vulnerability to "emotional mind" Values clarification   Self-care - nutrition, sleep, exercise  Treatment Target: Increase emotional regulation  Teach distress tolerance techniques - "what helps me"  Opposite action PLEASE  skills or self-care skills  Self-soothing  Cope ahead skills - imagery, rehearsal, problem-solving, exposure   Assertiveness communication  Future Appointments  Date Time Provider Department Center  01/16/2023 11:00 AM Kathreen Cosier, LCSW [facility] None    Kathreen Cosier, LCSW

## 2023-01-16 ENCOUNTER — Ambulatory Visit: Payer: Medicaid Other | Admitting: Licensed Clinical Social Worker

## 2023-01-16 DIAGNOSIS — F4322 Adjustment disorder with anxiety: Secondary | ICD-10-CM

## 2023-01-16 NOTE — Progress Notes (Addendum)
Counselor/Therapist Progress Note  Patient ID: Sarah Reese, MRN: [id number],    Date: 01/16/2023  Time Spent: 50 minutes    Treatment Type: Psychotherapy Patient's partner joined the last 10 minutes of session  Reported Symptoms:  Anxiety, anxious thoughts, obsessive thoughts   Mental Status Exam:  Appearance:   NA     Behavior:  Appropriate, Sharing, and Motivated  Motor:  NA  Speech/Language:   Clear and Coherent and Normal Rate  Affect:  NA  Mood:  normal  Thought process:  normal  Thought content:    WNL  Sensory/Perceptual disturbances:    WNL  Orientation:  oriented to person, place, time/date, situation, and day of week  Attention:  Good  Concentration:  Good  Memory:  WNL  Fund of knowledge:   Good  Insight:    Fair  Judgment:   Fair  Impulse Control:  Fair   Risk Assessment: Danger to Self:  No Self-injurious Behavior: No Danger to Others: No Duty to Warn:no Physical Aggression / Violence:No  Access to Firearms a concern: No  Gang Involvement:No   Subjective: Patient reports that overall she has been okay, she reports anxiety, anxious thoughts and troubles with obsessive thoughts. She reports things in her relationship have been good the last few days. Patient was receptive to feedback and intervention from LCSW and actively and effectively participated throughout the session. Patient reports benefit of regular sessions in addressing these symptoms.    Interventions: Cognitive Behavioral Therapy and Client Centered  LCSW established psychological safety. LCSW met with patient to identify needs related to stressors and functioning, and assess and monitor for signs and symptoms of anxiety, and assess safety. Checked in with patient regarding how they have been doing since the last follow-up session. LCSW assisted patient in processing their thoughts and emotions regarding challenges in relationship and difficulties in co-parenting with second child's mom  and also unresolved issues with her mom. Explored patient's difficulties with "obsessive thoughts" and anxiety. LCSW shared information about possible med management and encouraged her to discuss with PCP. LCSW highlighted thoughts vs feelings, reframed thoughts, reviewed use of distractions, discussed effective communication and provided brief psychoeducation about Mindfulness Meditation. LCSW discussed with both partners validation of feelings and understanding differing perspectives. Provided support through active listening, validation of feelings, and highlighted patient's strengths.  Diagnosis:   ICD-10-CM   1. Adjustment disorder with anxious mood  F43.22      Plan: Teach mindfulness meditation.   Patient's goal of treatment is to learn how to forgive people and to work on anger management.   --assess relationship challenges -build relationship  -Bringing closure to their past disputes -teach effective communication -explore attachment styles  -assist patient and her partner in developing understanding and support of each others needs -Repairing their relationship -Identifying the root causes of their disputes -Helping them understand their partner's point of view -Developing trust with each partner without alienating one or the other -Teaching them how their actions contribute to conflicts   Treatment Target: Understand the relationship between thoughts, emotions, and behaviors  Psychoeducation on CBTs model   Oriented the client to the therapeutic approach Teach the connection between thoughts, emotions, and behaviors  Treatment Target: Reducing vulnerability to "emotional mind" Values clarification   Self-care - nutrition, sleep, exercise  Treatment Target: Increase emotional regulation  Teach distress tolerance techniques - "what helps me"  Opposite action PLEASE  skills or self-care skills  Self-soothing  Cope ahead skills - imagery, rehearsal, problem-solving, exposure  Assertiveness communication  Future Appointments  Date Time Provider Department Center  01/23/2023 10:00 AM Kathreen Cosier, LCSW [facility] None     Kathreen Cosier, [place]

## 2023-01-23 ENCOUNTER — Ambulatory Visit: Payer: Medicaid Other | Admitting: Licensed Clinical Social Worker

## 2023-01-23 DIAGNOSIS — F4322 Adjustment disorder with anxiety: Secondary | ICD-10-CM

## 2023-01-23 NOTE — Progress Notes (Signed)
Counselor/Therapist Progress Note  Patient ID: Sarah Reese, MRN: [id number],    Date: 01/23/2023  Time Spent: 30 minutes    Treatment Type: Individual Therapy  Reported Symptoms:  Current mood stable; intermittent irritability and anxiety due to conflict with family of origin and in intimate partner relationship   Mental Status Exam:  Appearance:   NA     Behavior:  Appropriate, Sharing, and Motivated  Motor:  NA  Speech/Language:   Clear and Coherent and Normal Rate  Affect:  NA  Mood:  normal  Thought process:  normal  Thought content:    WNL  Sensory/Perceptual disturbances:    WNL  Orientation:  oriented to person, place, time/date, situation, and day of week  Attention:  Good  Concentration:  Good  Memory:  WNL  Fund of knowledge:   Good  Insight:    Fair  Judgment:   Fair  Impulse Control:  Fair   Risk Assessment: Danger to Self:  No Self-injurious Behavior: No Danger to Others: No Duty to Warn:no Physical Aggression / Violence:No  Access to Firearms a concern: No  Gang Involvement:No   Subjective: Patient was engaged and cooperative throughout the session using time effectively to discuss thoughts and feelings. She reports that she is okay but has had some difficult feelings related to interactions with both her family of origin and within her intimate partner relationship.  Patient was receptive to feedback and intervention from LCSW. Patient voices benefit from regular sessions and voices continued motivation for treatment.    Interventions: Cognitive Behavioral Therapy and Client Centered  LCSW established psychological safety. LCSW met with patient to identify needs related to stressors and functioning, and assess and monitor for signs and symptoms of anxiety, and assess safety. Checked in with patient regarding how they have been doing since the last follow-up session.  LCSW assisted patient in processing their thoughts and emotions regarding conflict  with family of origin and within intimate partner relationship. LCSW  discussed automatic thoughts, values conflict, avoidance / freeze response and taught patient The Heart Hug Skill WITH Resourcing a Pleasant, Meaningful, or Neutral Memory as a way to increase sense of safety and regulate emotions.   Diagnosis:   ICD-10-CM   1. Adjustment disorder with anxious mood  F43.22      Plan: Teach mindfulness meditation and build skills    Patient's goal of treatment is to learn how to forgive people and to work on anger management.   --assess relationship challenges -build relationship  -Bringing closure to their past disputes -teach effective communication -explore attachment styles  -assist patient and her partner in developing understanding and support of each others needs -Repairing their relationship -Identifying the root causes of their disputes -Helping them understand their partner's point of view -Developing trust with each partner without alienating one or the other -Teaching them how their actions contribute to conflicts   Treatment Target: Understand the relationship between thoughts, emotions, and behaviors  Psychoeducation on CBTs model   Oriented the client to the therapeutic approach Teach the connection between thoughts, emotions, and behaviors  Treatment Target: Reducing vulnerability to "emotional mind" Values clarification   Self-care - nutrition, sleep, exercise  Treatment Target: Increase emotional regulation  Teach distress tolerance techniques - "what helps me"  Opposite action PLEASE  skills or self-care skills  Self-soothing  Cope ahead skills - imagery, rehearsal, problem-solving, exposure   Assertiveness communication  Future Appointments  Date Time Provider Department Center  01/31/2023 10:00 AM Kathreen Cosier,  LCSW [facility] None    Kathreen Cosier, LCSW

## 2023-01-31 ENCOUNTER — Ambulatory Visit: Payer: Medicaid Other | Admitting: Licensed Clinical Social Worker

## 2023-02-07 ENCOUNTER — Telehealth: Payer: Self-pay | Admitting: Licensed Clinical Social Worker

## 2023-02-07 NOTE — Telephone Encounter (Signed)
Patient lvm for LCSW requesting to schedule and LCSW attempted to return call and lvm for patient offering appt. For 01/06 or 02/28/2023.

## 2023-03-01 ENCOUNTER — Telehealth: Payer: Self-pay | Admitting: Licensed Clinical Social Worker

## 2023-03-01 ENCOUNTER — Ambulatory Visit: Payer: Medicaid Other | Admitting: Licensed Clinical Social Worker

## 2023-03-01 NOTE — Telephone Encounter (Signed)
 Patient no showed to virtual appointment. LCSW attempted to call patient and LVM.

## 2023-03-15 ENCOUNTER — Ambulatory Visit: Payer: Medicaid Other | Admitting: Licensed Clinical Social Worker

## 2023-03-15 DIAGNOSIS — F4322 Adjustment disorder with anxiety: Secondary | ICD-10-CM

## 2023-03-15 NOTE — Progress Notes (Signed)
Counselor/Therapist Progress Note  Patient ID: Sarah Reese, MRN: [id number],    Date: 03/15/2023  Time Spent: 50 minutes; I spent 45 minutes on the real-time audio and video with the patient on the date of service. I spent an additional 5 minutes on pre- and post-visit activities on the date of service including collateral, chart review, team discussion, and documentation.    Treatment Type: Psychotherapy  Reported Symptoms:  Overall mood stability   Mental Status Exam:  Appearance:   Casual     Behavior:  Appropriate, Sharing, and Motivated  Motor:  Normal  Speech/Language:   Clear and Coherent and Normal Rate  Affect:  Appropriate, Congruent, and Full Range  Mood:  normal  Thought process:  normal  Thought content:    WNL  Sensory/Perceptual disturbances:    WNL  Orientation:  oriented to person, place, time/date, situation, and day of week  Attention:  Good  Concentration:  Good  Memory:  WNL  Fund of knowledge:   Good  Insight:    Fair  Judgment:   Fair  Impulse Control:  Fair   Risk Assessment: Danger to Self:  No Self-injurious Behavior: No Danger to Others: No Duty to Warn:no Physical Aggression / Violence:No  Access to Firearms a concern: No  Gang Involvement:No   Subjective: Patient was engaged and cooperative throughout the session using time effectively to discuss thoughts and feelings. Patient reports that overall her mood has been stable. She reports some struggles in intimate partner relationship and with her family of origin. Patient was receptive to feedback and intervention from LCSW.    Interventions: Cognitive Behavioral Therapy and Client Centered  LCSW established psychological safety. LCSW met with patient to identify needs related to stressors and functioning, and assess and monitor mood and anxiety symptoms, and assess safety. Checked in with patient regarding how they have been doing since the last follow-up session. LCSW assisted patient  in processing their thoughts and emotions regarding relationship with family of origin and with her partner. LCSW intervened with positive regard and optimism to validate client's emotions, highlighted unhelpful thoughts, reframed thoughts leading to distress, and supported client in exploring ways to increase boundaries and behaviors that align with her values.   Diagnosis:   ICD-10-CM   1. Adjustment disorder with anxious mood  F43.22      Plan: Teach mindfulness meditation and build skills    Patient's goal of treatment is to learn how to forgive people and to work on anger management.   --assess relationship challenges -build relationship  -Bringing closure to their past disputes -teach effective communication -explore attachment styles  -assist patient and her partner in developing understanding and support of each others needs -Repairing their relationship -Identifying the root causes of their disputes -Helping them understand their partner's point of view -Developing trust with each partner without alienating one or the other -Teaching them how their actions contribute to conflicts   Treatment Target: Understand the relationship between thoughts, emotions, and behaviors  Psychoeducation on CBTs model   Oriented the client to the therapeutic approach Teach the connection between thoughts, emotions, and behaviors  Treatment Target: Reducing vulnerability to "emotional mind" Values clarification   Self-care - nutrition, sleep, exercise  Treatment Target: Increase emotional regulation  Teach distress tolerance techniques - "what helps me"  Opposite action PLEASE  skills or self-care skills  Self-soothing  Cope ahead skills - imagery, rehearsal, problem-solving, exposure   Assertiveness communication  Future Appointments  Date Time Provider Department Center  03/20/2023  3:10 PM Kathreen Cosier, LCSW [facility] None    Kathreen Cosier, LCSW

## 2023-03-20 ENCOUNTER — Ambulatory Visit: Payer: Medicaid Other | Admitting: Licensed Clinical Social Worker

## 2023-03-20 DIAGNOSIS — F4322 Adjustment disorder with anxiety: Secondary | ICD-10-CM

## 2023-03-20 NOTE — Progress Notes (Signed)
Counselor/Therapist Progress Note  Patient ID: Sarah Reese, MRN: [id number],    Date: 03/20/2023  Time Spent: 45 total minutes. I spent 40 minutes on the real-time audio with the patient on the date of service. I spent an additional 5  minutes on pre- and post-visit activities on the date of service including collateral, chart review, team discussion, and documentation.   Treatment Type: Psychotherapy  Reported Symptoms:  Overall stable mood  Mental Status Exam:  Appearance:   NA     Behavior:  Appropriate, Sharing, and Motivated  Motor:  NA  Speech/Language:   Clear and Coherent and Normal Rate  Affect:  NA  Mood:  normal  Thought process:  normal  Thought content:    WNL  Sensory/Perceptual disturbances:    WNL  Orientation:  oriented to person, place, time/date, situation, and day of week  Attention:  Good  Concentration:  Good  Memory:  WNL  Fund of knowledge:   Good  Insight:    Fair  Judgment:   Fair  Impulse Control:  Fair   Risk Assessment: Danger to Self:  No Self-injurious Behavior: No Danger to Others: No Duty to Warn:no Physical Aggression / Violence:No  Access to Firearms a concern: No  Gang Involvement:No   Subjective: Patient was engaged and cooperative throughout the session using time effectively to discuss thoughts and feelings. Patient reports that she has been looking for jobs and also spent time discussing parenting challenges with oldest child. She reports that her mood has been stable but she describes feeling stressed. Patient was receptive to feedback and intervention from LCSW.        Interventions: Cognitive Behavioral Therapy and client Centered  LCSW established psychological safety. LCSW checked in with patient regarding how they have been doing since the last follow-up session. LCSW assisted patient in processing their thoughts and emotions regarding parenting challenges and conflict with partner.  LCSW explored patient's  perceptions, validated thoughts and reframed unhelpful thoughts, and problem solved options related to both parenting and work options. Provided support through active listening, validation of feelings, and highlighted patient's strengths.  Diagnosis:   ICD-10-CM   1. Adjustment disorder with anxious mood  F43.22      Plan:  Patient's goal of treatment is to learn how to forgive people and to work on anger management.   --assess relationship challenges -build relationship  -Bringing closure to their past disputes -teach effective communication -explore attachment styles  -assist patient and her partner in developing understanding and support of each others needs -Repairing their relationship -Identifying the root causes of their disputes -Helping them understand their partner's point of view -Developing trust with each partner without alienating one or the other -Teaching them how their actions contribute to conflicts   Treatment Target: Understand the relationship between thoughts, emotions, and behaviors  Psychoeducation on CBTs model   Oriented the client to the therapeutic approach Teach the connection between thoughts, emotions, and behaviors  Treatment Target: Reducing vulnerability to "emotional mind" Values clarification   Self-care - nutrition, sleep, exercise  Treatment Target: Increase emotional regulation  Teach distress tolerance techniques - "what helps me"  Opposite action PLEASE  skills or self-care skills  Self-soothing  Cope ahead skills - imagery, rehearsal, problem-solving, exposure   Assertiveness communication  Future Appointments  Date Time Provider Department Center  03/27/2023  3:00 PM Kathreen Cosier, LCSW [facility] None     Kathreen Cosier, LCSW

## 2023-03-27 ENCOUNTER — Ambulatory Visit: Payer: Medicaid Other | Admitting: Licensed Clinical Social Worker

## 2023-03-27 DIAGNOSIS — F4322 Adjustment disorder with anxiety: Secondary | ICD-10-CM

## 2023-03-27 NOTE — Progress Notes (Signed)
Counselor/Therapist Progress Note  Patient ID: Sarah Reese, MRN: [id number],    Date: 03/27/2023  Time Spent: 43 total minutes. I spent 48 minutes on real-time audio with the patient on the date of service. I spent an additional 5 minutes on pre- and post-visit activities on the date of service including collateral, chart review, team discussion, and documentation.    Treatment Type: Psychotherapy  Reported Symptoms:  low mood, mild anxiety  Mental Status Exam:  Appearance:   NA     Behavior:  Appropriate, Sharing, and Motivated  Motor:  NA  Speech/Language:   Clear and Coherent and Normal Rate  Affect:  NA  Mood:  normal  Thought process:  normal  Thought content:    WNL  Sensory/Perceptual disturbances:    WNL  Orientation:  oriented to person, place, time/date, situation, and day of week  Attention:  Good  Concentration:  Good  Memory:  WNL  Fund of knowledge:   Good  Insight:    Good  Judgment:   Good  Impulse Control:  Good   Risk Assessment: Danger to Self:  No Self-injurious Behavior: No Danger to Others: No Duty to Warn:no Physical Aggression / Violence:No  Access to Firearms a concern: No  Gang Involvement:No   Subjective: Patient was receptive to feedback and intervention from LCSW and actively and effectively participated throughout the session. She reports therapy has really helped her to stop and think before reacting. Patient is likely to benefit from future treatment because she reports benefit from regular sessions and is motivated to reach her therapy goals.    Interventions: Cognitive Behavioral Therapy and Client Centered  LCSW established psychological safety. LCSW met with patient to identify needs related to stressors and functioning, and assess and monitor mood and anxiety symptoms, and assess safety. Checked in with patient regarding how they have been doing since the last follow-up session. Engaged patient in processing their thoughts and  emotions regarding conflict in relationship. Validated patient's feelings of frustration, explored patient's values related to intimate partner relationships and family and reviewed effective communication.   Diagnosis:   ICD-10-CM   1. Adjustment disorder with anxious mood  F43.22      Plan: Patient's goal of treatment is to learn how to forgive people and to work on anger management.   --assess relationship challenges -build relationship  -Bringing closure to their past disputes -teach effective communication -explore attachment styles  -assist patient and her partner in developing understanding and support of each others needs -Repairing their relationship -Identifying the root causes of their disputes -Helping them understand their partner's point of view -Developing trust with each partner without alienating one or the other -Teaching them how their actions contribute to conflicts   Treatment Target: Understand the relationship between thoughts, emotions, and behaviors  Psychoeducation on CBTs model   Oriented the client to the therapeutic approach Teach the connection between thoughts, emotions, and behaviors  Treatment Target: Reducing vulnerability to "emotional mind" Values clarification   Self-care - nutrition, sleep, exercise  Treatment Target: Increase emotional regulation  Teach distress tolerance techniques - "what helps me"  Opposite action PLEASE  skills or self-care skills  Self-soothing  Cope ahead skills - imagery, rehearsal, problem-solving, exposure   Assertiveness communication  Future Appointments  Date Time Provider Department Center  04/03/2023  3:00 PM Kathreen Cosier, LCSW [facility] None    Kathreen Cosier, LCSW

## 2023-04-03 ENCOUNTER — Ambulatory Visit: Payer: Medicaid Other | Admitting: Licensed Clinical Social Worker

## 2023-04-04 ENCOUNTER — Ambulatory Visit: Payer: Medicaid Other | Admitting: Licensed Clinical Social Worker

## 2023-04-04 DIAGNOSIS — F4322 Adjustment disorder with anxiety: Secondary | ICD-10-CM

## 2023-04-04 NOTE — Progress Notes (Signed)
Counselor/Therapist Progress Note  Patient ID: Sarah Reese, MRN: [id number],    Date: 04/04/2023  Time Spent: 35 total minutes (patient arrive late). I spent 30 minutes on real-time audio with the patient on the date of service. I spent an additional 5 minutes on pre- and post-visit activities on the date of service including collateral, chart review, team discussion, and documentation.   Treatment Type: Individual Therapy  Reported Symptoms:  mild anxiety, mild irritability , secondary to relationship conflict   Mental Status Exam:  Appearance:   NA     Behavior:  Appropriate, Sharing, and Motivated  Motor:  NA  Speech/Language:   Clear and Coherent and Normal Rate  Affect:  NA  Mood:  normal  Thought process:  normal  Thought content:    WNL  Sensory/Perceptual disturbances:    WNL  Orientation:  oriented to person, place, time/date, situation, and day of week  Attention:  Good  Concentration:  Good  Memory:  WNL  Fund of knowledge:   Good  Insight:    Fair  Judgment:   Fair  Impulse Control:  Fair   Risk Assessment: Danger to Self:  No Self-injurious Behavior: No Danger to Others: No Duty to Warn:no Physical Aggression / Violence:No  Access to Firearms a concern: No  Gang Involvement:No   Subjective: Patient was engaged and cooperative throughout the session using time effectively to discuss thoughts and feelings. Patient reports that she has been overall okay, but continues to have challenges in her relationship. Patient was receptive to feedback and intervention from LCSW.   Interventions: Cognitive Behavioral Therapy and Client Centered  LCSW established psychological safety. LCSW met with patient to identify needs related to stressors and functioning, and assess and monitor symptoms and assess safety. Checked in with patient regarding how they have been doing since the last follow-up session. Engaged patient in processing current psychosocial stressors,  ongoing challenges in relationship. Validated patient's frustrations, assisted patient in broadening her perspective, discussed options to increase open communication and discussed options to seek parenting classes to help with co-parenting. Provided support through active listening, validation of feelings, and highlighted patient's strengths.  Diagnosis:   ICD-10-CM   1. Adjustment disorder with anxious mood  F43.22      Plan: Patient's goal of treatment is to learn how to forgive people and to work on anger management.   --assess relationship challenges -build relationship  -Bringing closure to their past disputes -teach effective communication -explore attachment styles  -assist patient and her partner in developing understanding and support of each others needs -Repairing their relationship -Identifying the root causes of their disputes -Helping them understand their partner's point of view -Developing trust with each partner without alienating one or the other -Teaching them how their actions contribute to conflicts   Treatment Target: Understand the relationship between thoughts, emotions, and behaviors  Psychoeducation on CBTs model   Oriented the client to the therapeutic approach Teach the connection between thoughts, emotions, and behaviors  Treatment Target: Reducing vulnerability to "emotional mind" Values clarification   Self-care - nutrition, sleep, exercise  Treatment Target: Increase emotional regulation  Teach distress tolerance techniques - "what helps me"  Opposite action PLEASE  skills or self-care skills  Self-soothing  Cope ahead skills - imagery, rehearsal, problem-solving, exposure   Assertiveness communication  Future Appointments  Date Time Provider Department Center  04/11/2023 11:00 AM Kathreen Cosier, LCSW [facility] None    Kathreen Cosier, LCSW

## 2023-04-11 ENCOUNTER — Ambulatory Visit: Payer: Medicaid Other | Admitting: Licensed Clinical Social Worker

## 2023-04-11 DIAGNOSIS — F4322 Adjustment disorder with anxiety: Secondary | ICD-10-CM

## 2023-04-11 NOTE — Progress Notes (Signed)
Counselor/Therapist Progress Note  Patient ID: Sarah Reese, MRN: [id number],    Date: 04/11/2023  Time Spent: 50 total minutes. I spent 45 minutes on real-time audio with the patient on the date of service. I spent an additional 5 minutes on pre- and post-visit activities on the date of service including collateral, chart review, team discussion, and documentation.   Treatment Type: Individual Therapy  Reported Symptoms:  feeling overwhelmed, crying spells, low mood   Mental Status Exam:  Appearance:   NA     Behavior:  Appropriate, Sharing, and Motivated  Motor:  NA  Speech/Language:   Clear and Coherent and Normal Rate  Affect:  NA  Mood:  normal  Thought process:  normal  Thought content:    WNL  Sensory/Perceptual disturbances:    WNL  Orientation:  oriented to person, place, time/date, situation, and day of week  Attention:  Good  Concentration:  Good  Memory:  WNL  Fund of knowledge:   Good  Insight:    Good  Judgment:   Good  Impulse Control:  Good   Risk Assessment: Danger to Self:  No Self-injurious Behavior: No Danger to Others: No Duty to Warn:no Physical Aggression / Violence:No  Access to Firearms a concern: No  Gang Involvement:No   Subjective: Patient was engaged and cooperative throughout the session using time effectively to discuss thoughts and feelings. She reports that she has been overwhelmed due to challenges with her oldest child and ongoing relationship conflict with partner. Patient was receptive to feedback and intervention from LCSW. Patient voices continued motivation for treatment and benefit from future treatment.  Interventions: Cognitive Behavioral Therapy and Client Centered  LCSW established psychological safety. LCSW met with patient to identify needs related to stressors and functioning, and assess and monitor symptoms and assess safety. Checked in with patient regarding how they have been doing since the last follow-up session.  LCSW assisted patient in processing their thoughts and emotions regarding current psychosocial stressors - parenting challenges with oldest child and conflict in relationship. LCSW validated patient's feelings of frustration and hurt and intervened with positive regard and optimism to validate client's emotions, discussed boundaries and supported client in exploring ways to increase her intentional use of self care.  Diagnosis:   ICD-10-CM   1. Adjustment disorder with anxious mood  F43.22      Plan: Patient's goal of treatment is to learn how to forgive people and to work on anger management.   --assess relationship challenges -build relationship  -Bringing closure to their past disputes -teach effective communication -explore attachment styles  -assist patient and her partner in developing understanding and support of each others needs -Repairing their relationship -Identifying the root causes of their disputes -Helping them understand their partner's point of view -Developing trust with each partner without alienating one or the other -Teaching them how their actions contribute to conflicts   Treatment Target: Understand the relationship between thoughts, emotions, and behaviors  Psychoeducation on CBTs model   Oriented the client to the therapeutic approach Teach the connection between thoughts, emotions, and behaviors  Treatment Target: Reducing vulnerability to "emotional mind" Values clarification   Self-care - nutrition, sleep, exercise  Treatment Target: Increase emotional regulation  Teach distress tolerance techniques - "what helps me"  Opposite action PLEASE  skills or self-care skills  Self-soothing  Cope ahead skills - imagery, rehearsal, problem-solving, exposure   Assertiveness communication  Future Appointments  Date Time Provider Department Center  04/17/2023 11:00 AM Kathreen Cosier,  LCSW [facility] None    Kathreen Cosier, LCSW

## 2023-04-17 ENCOUNTER — Ambulatory Visit: Payer: Medicaid Other | Admitting: Licensed Clinical Social Worker

## 2023-04-17 DIAGNOSIS — F4310 Post-traumatic stress disorder, unspecified: Secondary | ICD-10-CM

## 2023-04-17 DIAGNOSIS — F4322 Adjustment disorder with anxiety: Secondary | ICD-10-CM

## 2023-04-17 NOTE — Progress Notes (Signed)
 Counselor/Therapist Progress Note  Patient ID: Sarah Reese, MRN: [id number],    Date: 04/17/2023  Time Spent: 49 total minutes. I spent 44 minutes on real-time audio with the patient on the date of service. I spent an additional 5 minutes on pre- and post-visit activities on the date of service including collateral, chart review, team discussion, and documentation.   Treatment Type: Individual Therapy  Reported Symptoms:  mild anxiety  Mental Status Exam:  Appearance:   NA     Behavior:  Appropriate, Sharing, and Motivated  Motor:  NA  Speech/Language:   Clear and Coherent and Normal Rate  Affect:  NA  Mood:  normal  Thought process:  normal  Thought content:    WNL  Sensory/Perceptual disturbances:    WNL  Orientation:  oriented to person, place, time/date, situation, and day of week  Attention:  Good  Concentration:  Good  Memory:  WNL  Fund of knowledge:   Good  Insight:    Good  Judgment:   Fair  Impulse Control:  Fair   Risk Assessment: Danger to Self:  No Self-injurious Behavior: No Danger to Others: No Duty to Warn:no Physical Aggression / Violence:No  Access to Firearms a concern: No  Gang Involvement:No   Subjective: Patient was engaged and cooperative throughout the session using time effectively to discuss thoughts and feelings. She reports that she doesn't feel happy in her intimate partner relationship and feels lack of support. She also describes that she often feels anxious and stressed and reports that cleaning helps this feeling, but sometimes when she cleans she cannot stop even when she wants to and feels tired. Patient reports she used a timer to set a time on her cleaning last night, and reports that it did help. Patient voices interest in an as needed medication and was receptive to talking with her PCP about this. Patient was receptive to feedback and intervention from LCSW. Patient voices continued motivation for treatment and reports benefit  of regular sessions.      Interventions: Cognitive Behavioral Therapy and Client Centered  LCSW established psychological safety. LCSW met with patient to identify needs related to stressors and functioning, and assess and monitor for signs and symptoms of depression and anxiety, and assess safety. Checked in with patient regarding how they have been doing since the last follow-up session. LCSW assisted patient in processing their thoughts and emotions regarding conflict in relationship. Assessed patient's anxiety symptoms and discussed the use of cleaning to manage anxiety. Taught patient about stopping and noticing without judgement what is happening for her and attempted to lead her through somatic practice to assess and billed patient's awareness of her felt experiences. Provided support through active listening, validation of feelings, and highlighted patient's strengths.  Diagnosis:   ICD-10-CM   1. Adjustment disorder with anxious mood  F43.22     2. PTSD (post-traumatic stress disorder)  F43.10      Plan: Patient's goal of treatment is to learn how to forgive people and to work on anger management.   --assess relationship challenges -build relationship  -Bringing closure to their past disputes -teach effective communication -explore attachment styles  -assist patient and her partner in developing understanding and support of each others needs -Repairing their relationship -Identifying the root causes of their disputes -Helping them understand their partner's point of view -Developing trust with each partner without alienating one or the other -Teaching them how their actions contribute to conflicts   Treatment Target: Understand the relationship  between thoughts, emotions, and behaviors  Psychoeducation on CBTs model   Oriented the client to the therapeutic approach Teach the connection between thoughts, emotions, and behaviors  Treatment Target: Reducing vulnerability to  "emotional mind" Values clarification   Self-care - nutrition, sleep, exercise  Treatment Target: Increase emotional regulation  Teach distress tolerance techniques - "what helps me"  Opposite action PLEASE  skills or self-care skills  Self-soothing  Cope ahead skills - imagery, rehearsal, problem-solving, exposure   Assertiveness communication  Future Appointments  Date Time Provider Department Center  04/24/2023 11:00 AM Kathreen Cosier, LCSW [facility] None     Kathreen Cosier, [place]

## 2023-04-24 ENCOUNTER — Ambulatory Visit: Payer: Medicaid Other | Admitting: Licensed Clinical Social Worker

## 2023-04-24 DIAGNOSIS — F4322 Adjustment disorder with anxiety: Secondary | ICD-10-CM

## 2023-04-24 DIAGNOSIS — F4310 Post-traumatic stress disorder, unspecified: Secondary | ICD-10-CM

## 2023-04-24 NOTE — Progress Notes (Signed)
 Counselor/Therapist Progress Note  Patient ID: Sarah Reese, MRN: [id number],    Date: 04/24/2023  Time Spent: 40 total minutes. I spent 35 minutes on real-time audio with the patient on the date of service. I spent an additional 5 minutes on pre- and post-visit activities on the date of service including collateral, chart review, team discussion, and documentation.   Treatment Type: Individual Therapy  Reported Symptoms:  Anxiety, overwhelm, distress, anger, low mood  Mental Status Exam:  Appearance:   NA     Behavior:  Appropriate, Sharing, Agitated, and Motivated  Motor:  NA  Speech/Language:   Clear and Coherent and Normal Rate  Affect:  NA  Mood:  angry, anxious, and irritable  Thought process:  normal  Thought content:    WNL  Sensory/Perceptual disturbances:    WNL  Orientation:  oriented to person, place, time/date, situation, and day of week  Attention:  Good  Concentration:  Good  Memory:  WNL  Fund of knowledge:   Good  Insight:    Fair  Judgment:   Fair  Impulse Control:  Fair   Risk Assessment: Danger to Self:  No Self-injurious Behavior: No Danger to Others: No Duty to Warn:no Physical Aggression / Violence:No  Access to Firearms a concern: No  Gang Involvement:No   Subjective: Patient was engaged and cooperative throughout the session using time effectively to discuss thoughts and feelings. Patient presents reporting feeling very angry due to lack of support from her partner and stress related to finances. Patient was mostly receptive to feedback and intervention from LCSW. She declined to engage in resourcing exercise, but did report benefit from verbal ventilating in supportive environment. Patient voices continued motivation for treatment.   Interventions: Cognitive Behavioral Therapy and Client Centered  LCSW established psychological safety. LCSW met with patient to identify needs related to stressors and functioning, and assess and monitor for  signs and symptoms of depression and anxiety, and assess safety. Checked in with patient regarding how they have been doing since the last follow-up session. Provided supportive space for patient to coregulate and verbally ventilate thoughts and emotions related to relationship challenges and stress due to financial issues. LCSW provided support through active listening, validation of feelings, and highlighted patient's strengths. LCSW reviewed trauma responses and offer to lead patient in resourcing exercise and explored options for patient to increase self-care so she can reengage in seeking support from partner. LCSW offered resources for food and diapers. LCSW agreed to schedule another session with patient for this week.   Diagnosis:   ICD-10-CM   1. Adjustment disorder with anxious mood  F43.22     2. PTSD (post-traumatic stress disorder)  F43.10      Plan:  Patient's goal of treatment is to learn how to forgive people and to work on anger management.   --assess relationship challenges -build relationship  -Bringing closure to their past disputes -teach effective communication -explore attachment styles  -assist patient and her partner in developing understanding and support of each others needs -Repairing their relationship -Identifying the root causes of their disputes -Helping them understand their partner's point of view -Developing trust with each partner without alienating one or the other -Teaching them how their actions contribute to conflicts   Treatment Target: Understand the relationship between thoughts, emotions, and behaviors  Psychoeducation on CBTs model   Oriented the client to the therapeutic approach Teach the connection between thoughts, emotions, and behaviors  Treatment Target: Reducing vulnerability to "emotional mind" Values clarification  Self-care - nutrition, sleep, exercise  Treatment Target: Increase emotional regulation  Teach distress tolerance  techniques - "what helps me"  Opposite action PLEASE  skills or self-care skills  Self-soothing  Cope ahead skills - imagery, rehearsal, problem-solving, exposure   Assertiveness communication  Future Appointments  Date Time Provider Department Center  04/27/2023  8:30 AM Kathreen Cosier, LCSW [facility] None    Kathreen Cosier, LCSW

## 2023-04-25 ENCOUNTER — Telehealth: Payer: Self-pay | Admitting: Licensed Clinical Social Worker

## 2023-04-25 NOTE — Telephone Encounter (Signed)
 Patient lvm for LCSW regarding information for therapy for her friend's child. LCSW returned call and lvm leaving contact information for [facility].

## 2023-04-27 ENCOUNTER — Telehealth: Payer: Self-pay | Admitting: Licensed Clinical Social Worker

## 2023-04-27 ENCOUNTER — Ambulatory Visit: Admitting: Licensed Clinical Social Worker

## 2023-04-27 NOTE — Telephone Encounter (Signed)
 LCSW attempted two calls to patient regarding schedule phone appointment. LCSW left two vm for patient informing her of the appointment.

## 2023-05-15 ENCOUNTER — Ambulatory Visit: Admitting: Licensed Clinical Social Worker

## 2023-05-15 DIAGNOSIS — F4310 Post-traumatic stress disorder, unspecified: Secondary | ICD-10-CM

## 2023-05-15 DIAGNOSIS — F4322 Adjustment disorder with anxiety: Secondary | ICD-10-CM

## 2023-05-15 NOTE — Progress Notes (Signed)
 Counselor/Therapist Progress Note  Patient ID: Sarah Reese, MRN: [id number],    Date: 05/15/2023  Time Spent: 45 total minutes. I spent 40 minutes on real-time audio on the date of service. I spent an additional 5 minutes on pre- and post-visit activities on the date of service including collateral, chart review, team discussion, and documentation.    Patient arrived 10 minutes late.   Treatment Type: Individual Therapy  Reported Symptoms:  "half good, half bad", intermittent anger due to relationship challenges and low support  Mental Status Exam:  Appearance:   NA     Behavior:  Appropriate, Sharing, and Motivated  Motor:  NA  Speech/Language:   Clear and Coherent and Normal Rate  Affect:  NA  Mood:  normal  Thought process:  normal  Thought content:    WNL  Sensory/Perceptual disturbances:    WNL  Orientation:  oriented to person, place, time/date, situation, and day of week  Attention:  Good  Concentration:  Good  Memory:  WNL  Fund of knowledge:   Good  Insight:    Good  Judgment:   Fair  Impulse Control:  Fair   Risk Assessment: Danger to Self:  No Self-injurious Behavior: No Danger to Others: No Duty to Warn:no Physical Aggression / Violence:No  Access to Firearms a concern: No  Gang Involvement:No   Subjective: Patient shares that things have been "good and bad" she reports feeling a lot of anger due to relationship and co-parenting conflict with girlfriend. Patient shares that she has noticed when she is getting very angry and removes herself from situations. Patient was receptive to feedback and intervention from LCSW and actively and effectively participated throughout the session. Patient is likely to benefit from future treatment because they remain motivated to decrease distress and reports benefit of regular sessions in addressing symptoms.    Interventions: Cognitive Behavioral Therapy and Client Centered  LCSW established psychological safety.  LCSW met with patient to identify needs related to stressors and functioning, and assess and monitor for signs and symptoms of anxiety and depression, and assess safety. Checked in with patient regarding how they have been doing since the last follow-up session. LCSW provided supportive space for patient to process their thoughts and emotions regarding conflict in intimate partner relationship. LCSW intervened with positive regard and optimism to validate client's emotions, reframed unhelpful thoughts, and supported patient in identifying ways to resource herself.    Diagnosis:   ICD-10-CM   1. Adjustment disorder with anxious mood  F43.22     2. PTSD (post-traumatic stress disorder)  F43.10      Plan: Patient's goal of treatment is to learn how to forgive people and to work on anger management.   --assess relationship challenges -build relationship  -Bringing closure to their past disputes -teach effective communication -explore attachment styles  -assist patient and her partner in developing understanding and support of each others needs -Repairing their relationship -Identifying the root causes of their disputes -Helping them understand their partner's point of view -Developing trust with each partner without alienating one or the other -Teaching them how their actions contribute to conflicts   Treatment Target: Understand the relationship between thoughts, emotions, and behaviors  Psychoeducation on CBTs model   Oriented the client to the therapeutic approach Teach the connection between thoughts, emotions, and behaviors  Treatment Target: Reducing vulnerability to "emotional mind" Values clarification   Self-care - nutrition, sleep, exercise  Treatment Target: Increase emotional regulation  Teach distress tolerance techniques - "  what helps me"  Opposite action PLEASE  skills or self-care skills  Self-soothing  Cope ahead skills - imagery, rehearsal, problem-solving, exposure    Assertiveness communication  Future Appointments  Date Time Provider Department Center  05/22/2023  2:00 PM Sarah Cosier, LCSW [facility] None    Sarah Reese, [place]

## 2023-05-22 ENCOUNTER — Ambulatory Visit: Admitting: Licensed Clinical Social Worker

## 2023-05-22 DIAGNOSIS — F4322 Adjustment disorder with anxiety: Secondary | ICD-10-CM

## 2023-05-22 NOTE — Progress Notes (Signed)
 Counselor/Therapist Progress Note  Patient ID: Sarah Reese, MRN: [id number],    Date: 05/22/2023  Time Spent: 48 total minutes. I spent 43 minutes on real-time audio with the patient on the date of service. I spent an additional 5 minutes on pre- and post-visit activities on the date of service including collateral, chart review, team discussion, and documentation.   Treatment Type: Individual Therapy  Reported Symptoms:  "Been okay"   Mental Status Exam:  Appearance:   NA     Behavior:  Appropriate, Sharing, and Motivated  Motor:  Normal  Speech/Language:   Clear and Coherent and Normal Rate  Affect:  NA  Mood:  normal  Thought process:  normal  Thought content:    WNL  Sensory/Perceptual disturbances:    WNL  Orientation:  oriented to person, place, time/date, situation, and day of week  Attention:  Good  Concentration:  Good  Memory:  WNL  Fund of knowledge:   Good  Insight:    Good  Judgment:   Good  Impulse Control:  Good   Risk Assessment: Danger to Self:  No Self-injurious Behavior: No Danger to Others: No Duty to Warn:no Physical Aggression / Violence:No  Access to Firearms a concern: No  Gang Involvement:No   Subjective: Patient shares that she's been working a lot trying to make money and hasn't been doing anything for herself- wants to learn to take care of herself. 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 And is looking forward to it and also feels like this will allow her the opportunity to take care of herself the way she desires. Patient was engaged and cooperative throughout the session using time effectively to discuss thoughts, feelings, relationship challenges and self-care. Patient was receptive to feedback and intervention from LCSW and voices continued motivation for treatment.  Interventions: Cognitive Behavioral Therapy and Client Centered  LCSW established psychological safety. LCSW met with patient to identify  needs related to stressors and functioning, and assess and monitor for signs and symptoms of anxiety and depressed mood, and assess safety. Checked in with patient regarding how they have been doing since the last follow-up session. LCSW assisted patient in processing their thoughts and emotions regarding challenges with taking care of herself the way she desires and also continued relationship challenges. LCSW reframed unhelpful thoughts, problem solved solutions, reviewed self-care and boundaries, and highlighted patient's strengths.  Diagnosis:   ICD-10-CM   1. Adjustment disorder with anxious mood  F43.22      Plan:  Patient's goal of treatment is to learn how to forgive people and to work on anger management.   --assess relationship challenges -build relationship  -Bringing closure to their past disputes -teach effective communication -explore attachment styles  -assist patient and her partner in developing understanding and support of each others needs -Repairing their relationship -Identifying the root causes of their disputes -Helping them understand their partner's point of view -Developing trust with each partner without alienating one or the other -Teaching them how their actions contribute to conflicts   Treatment Target: Understand the relationship between thoughts, emotions, and behaviors  Psychoeducation on CBTs model   Oriented the client to the therapeutic approach Teach the connection between thoughts, emotions, and behaviors  Treatment Target: Reducing vulnerability to "emotional mind" Values clarification   Self-care - nutrition, sleep, exercise  Treatment Target: Increase emotional regulation  Teach distress tolerance techniques - "what helps me"  Opposite action PLEASE  skills or self-care  skills  Self-soothing  Cope ahead skills - imagery, rehearsal, problem-solving, exposure   Assertiveness communication  Future Appointments  Date Time Provider Department  Center  05/29/2023  2:00 PM Kathreen Cosier, LCSW [facility] None    Kathreen Cosier, [place]

## 2023-05-29 ENCOUNTER — Ambulatory Visit: Admitting: Licensed Clinical Social Worker

## 2023-05-29 DIAGNOSIS — F4322 Adjustment disorder with anxiety: Secondary | ICD-10-CM

## 2023-05-29 NOTE — Progress Notes (Signed)
 Counselor/Therapist Progress Note  Patient ID: Sarah Reese, MRN: [id number],    Date: 05/29/2023  Time Spent: 50 total minutes. I spent 45 minutes on real-time audio with the patient on the date of service. I spent an additional 5 minutes on pre- and post-visit activities on the date of service including collateral, chart review, team discussion, and documentation.     Treatment Type: Individual Therapy  Reported Symptoms:  Stable mood; mild anxiety, worries, irritability   Mental Status Exam:  Appearance:   NA     Behavior:  Appropriate, Sharing, and Motivated  Motor:  NA  Speech/Language:   Clear and Coherent and Normal Rate  Affect:  NA  Mood:  normal  Thought process:  normal  Thought content:    WNL  Sensory/Perceptual disturbances:    WNL  Orientation:  oriented to person, place, time/date, situation, and day of week  Attention:  Good  Concentration:  Good  Memory:  WNL  Fund of knowledge:   Good  Insight:    Good  Judgment:   Good  Impulse Control:  Good   Risk Assessment: Danger to Self:  No Self-injurious Behavior: No Danger to Others: No Duty to Warn:no Physical Aggression / Violence:No  Access to Firearms a concern: No  Gang Involvement:No   Subjective: Patient reports that everything is going well with new job, with relationship, just trying to figure out balancing things and getting enough sleep. Patient was engaged and cooperative throughout the session using time effectively to discuss thoughts and feelings. Patient voices continued motivation for treatment and remains motivated to manage symptoms and improve functioning.    Interventions: Cognitive Behavioral Therapy and Client Center  LCSW established psychological safety. LCSW met with patient to identify needs related to stressors and functioning, and assess and monitor for signs and symptoms of anxiety and depressed mood, and assess safety. Checked in with patient regarding how they have been  doing since the last follow-up session. LCSW assisted patient in processing their thoughts and emotions regarding news of her ex-girlfriend passing away, conflict in relationship with her mom, and values conflict in relationship with ex-partner/partner. Validated patient's emotions, reviewed radical acceptance, reviewed values, and discussed options to align behaviors with her values.   Diagnosis:   ICD-10-CM   1. Adjustment disorder with anxious mood  F43.22      Plan: Patient's goal of treatment is to learn how to forgive people and to work on anger management.   --assess relationship challenges -build relationship  -Bringing closure to their past disputes -teach effective communication -explore attachment styles  -assist patient and her partner in developing understanding and support of each others needs -Repairing their relationship -Identifying the root causes of their disputes -Helping them understand their partner's point of view -Developing trust with each partner without alienating one or the other -Teaching them how their actions contribute to conflicts   Treatment Target: Understand the relationship between thoughts, emotions, and behaviors  Psychoeducation on CBTs model   Oriented the client to the therapeutic approach Teach the connection between thoughts, emotions, and behaviors  Treatment Target: Reducing vulnerability to "emotional mind" Values clarification   Self-care - nutrition, sleep, exercise  Treatment Target: Increase emotional regulation  Teach distress tolerance techniques - "what helps me"  Opposite action PLEASE  skills or self-care skills  Self-soothing  Cope ahead skills - imagery, rehearsal, problem-solving, exposure   Assertiveness communication  Future Appointments  Date Time Provider Department Center  06/06/2023  2:00 PM Kathreen Cosier,  LCSW [facility] None     Kathreen Cosier, LCSW

## 2023-06-06 ENCOUNTER — Ambulatory Visit: Admitting: Licensed Clinical Social Worker

## 2023-06-06 DIAGNOSIS — F4322 Adjustment disorder with anxiety: Secondary | ICD-10-CM

## 2023-06-06 DIAGNOSIS — F4310 Post-traumatic stress disorder, unspecified: Secondary | ICD-10-CM

## 2023-06-06 NOTE — Progress Notes (Signed)
 Counselor/Therapist Progress Note  Patient ID: Sarah Reese, MRN: [id number],    Date: 06/06/2023  Time Spent: 43 total minutes. I spent 38 minutes on real-time audio with the patient on the date of service. I spent an additional 5  minutes on pre- and post-visit activities on the date of service including collateral, chart review, team discussion, and documentation.    Treatment Type: Individual Therapy  Reported Symptoms:  Depressed mood, sadness due to loss of job  Mental Status Exam:   Appearance:   NA     Behavior:  Appropriate, Sharing, and Motivated  Motor:  NA  Speech/Language:   Clear and Coherent and Normal Rate  Affect:  NA  Mood:  normal  Thought process:  normal  Thought content:    WNL  Sensory/Perceptual disturbances:    WNL  Orientation:  oriented to person, place, time/date, situation, and day of week  Attention:  Good  Concentration:  Good  Memory:  WNL  Fund of knowledge:   Good  Insight:    Good  Judgment:   Good  Impulse Control:  Good   Risk Assessment: Danger to Self:  No Self-injurious Behavior: No Danger to Others: No Duty to Warn:no Physical Aggression / Violence:No  Access to Firearms a concern: No  Gang Involvement:No   Subjective: Patient reports feeling depressed due to the recent loss of her job. She also expressed concern and worry about her baby's upcoming birthday party due to issues with family of origin. During the session, after processing her thoughts and emotions, the patient demonstrated increased hopefulness and cognitive flexibility. She was engaged and cooperative, utilizing the session effectively to explore and discuss her thoughts and feelings. Patient was receptive to feedback and therapeutic interventions and expressed ongoing motivation for treatment.  Interventions: Cognitive Behavioral Therapy, Mindfulness Meditation, and Client Centered  LCSW established psychological safety and created a supportive, nonjudgmental  space for the patient. The session included a check-in on the patient's wellbeing since the last visit, along with an assessment of current stressors, overall functioning, mood symptoms, and safety. LCSW supported the patient in processing emotions related to recent job loss and challenges with her family of origin. Interventions included active listening, validation of the patient's feelings, and highlighting her strengths. Psychoeducation was provided on coping with difficult emotions, managing anxious thoughts, and practicing radical acceptance. LCSW reviewed mindfulness meditation techniques and encouraged to use the Insight Timer app for guided support. Cognitive-behavioral techniques were utilized to explore and reframe unhelpful thoughts, particularly around feelings of embarrassment and anxiety, while promoting a focus on aspects of life within her control.  Diagnosis:   ICD-10-CM   1. Adjustment disorder with anxious mood  F43.22     2. PTSD (post-traumatic stress disorder)  F43.10      Plan: Patient's goal of treatment is to learn how to forgive people and to work on anger management.   --assess relationship challenges -build relationship  -Bringing closure to their past disputes -teach effective communication -explore attachment styles  -assist patient and her partner in developing understanding and support of each others needs -Repairing their relationship -Identifying the root causes of their disputes -Helping them understand their partner's point of view -Developing trust with each partner without alienating one or the other -Teaching them how their actions contribute to conflicts   Treatment Target: Understand the relationship between thoughts, emotions, and behaviors  Psychoeducation on CBTs model   Oriented the client to the therapeutic approach Teach the connection between thoughts, emotions,  and behaviors  Treatment Target: Reducing vulnerability to "emotional  mind" Values clarification   Self-care - nutrition, sleep, exercise  Treatment Target: Increase emotional regulation  Teach distress tolerance techniques - "what helps me"  Opposite action PLEASE  skills or self-care skills  Self-soothing  Cope ahead skills - imagery, rehearsal, problem-solving, exposure   Assertiveness communication Mindfulness   Future Appointments  Date Time Provider Department Center  06/13/2023  2:00 PM Nyle Belling, LCSW [facility] None    Nyle Belling, [place]

## 2023-06-13 ENCOUNTER — Ambulatory Visit: Admitting: Licensed Clinical Social Worker

## 2023-06-13 DIAGNOSIS — F4322 Adjustment disorder with anxiety: Secondary | ICD-10-CM
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
## 2023-06-20 ENCOUNTER — Ambulatory Visit: Admitting: Licensed Clinical Social Worker

## 2023-06-20 DIAGNOSIS — F4310 Post-traumatic stress disorder, unspecified: Secondary | ICD-10-CM

## 2023-06-20 DIAGNOSIS — F4322 Adjustment disorder with anxiety: Secondary | ICD-10-CM

## 2023-06-20 NOTE — Progress Notes (Signed)
 Counselor/Therapist Progress Note  Patient ID: Sarah Reese, MRN: [id number],    Date: 06/20/2023  Time Spent: 33 total minutes. I spent 28 minutes on real-time audio with the patient on the date of service. I spent an additional 5 minutes on pre- and post-visit activities on the date of service including collateral, chart review, team discussion, and documentation.    Treatment Type: Psychotherapy  Reported Symptoms:  Intermittent anxiety, anxious thoughts, overwhelm, irritability   Mental Status Exam:  Appearance:   NA     Behavior:  Appropriate, Sharing, and Motivated  Motor:  NA  Speech/Language:   Clear and Coherent and Normal Rate  Affect:  NA  Mood:  normal  Thought process:  normal  Thought content:    WNL  Sensory/Perceptual disturbances:    WNL  Orientation:  oriented to person, place, time/date, situation, and day of week  Attention:  Good  Concentration:  Good  Memory:  WNL  Fund of knowledge:   Good  Insight:    Good  Judgment:   Good  Impulse Control:  Good   Risk Assessment: Danger to Self:  No Self-injurious Behavior: No Danger to Others: No Duty to Warn:no Physical Aggression / Violence:No  Access to Firearms a concern: No  Gang Involvement:No   Subjective: Patient reports feeling overwhelmed due to multiple ongoing stressors. She states that she has been effectively regulating her emotions by taking space when she notices heightened emotional states, which helps her avoid reacting impulsively or saying things she may later regret. Patient was receptive to feedback and actively engaged throughout the session. She demonstrated insight and willingness to participate in therapeutic interventions. Patient appears motivated to reduce symptoms and improve overall functioning, and she reports that regular sessions have been beneficial in helping her think and respond more constructively.  Interventions: Cognitive Behavioral Therapy and Client Centered   LCSW established psychological safety and engaged the patient in a supportive, therapeutic environment. The session focused on identifying the patient's needs related to current stressors and overall functioning, as well as monitoring symptoms and assessing for safety. LCSW conducted a check-in to evaluate the patient's status since the last session. The patient was provided with space to process thoughts and emotions related to ongoing stressors. Support was offered through active listening, validation of the patient's experiences, and reinforcement of effective coping strategies the patient has used to manage emotional responses.  Diagnosis:   ICD-10-CM   1. Adjustment disorder with anxious mood  F43.22     2. PTSD (post-traumatic stress disorder)  F43.10      Plan: Patient's goal of treatment is to learn how to forgive people and to work on anger management.   --assess relationship challenges -build relationship  -Bringing closure to their past disputes -teach effective communication -explore attachment styles  -assist patient and her partner in developing understanding and support of each others needs -Repairing their relationship -Identifying the root causes of their disputes -Helping them understand their partner's point of view -Developing trust with each partner without alienating one or the other -Teaching them how their actions contribute to conflicts   Treatment Target: Understand the relationship between thoughts, emotions, and behaviors  Psychoeducation on CBTs model   Oriented the client to the therapeutic approach Teach the connection between thoughts, emotions, and behaviors  Treatment Target: Reducing vulnerability to "emotional mind" Values clarification   Self-care - nutrition, sleep, exercise  Treatment Target: Increase emotional regulation  Teach distress tolerance techniques - "what helps me"  Opposite  action PLEASE  skills or self-care skills  Self-soothing   Cope ahead skills - imagery, rehearsal, problem-solving, exposure   Assertiveness communication Mindfulness   Future Appointments  Date Time Provider Department Center  06/27/2023  2:00 PM Nyle Belling, LCSW [facility] None    Nyle Belling, [place]

## 2023-06-27 ENCOUNTER — Ambulatory Visit: Admitting: Licensed Clinical Social Worker

## 2023-06-27 DIAGNOSIS — F4322 Adjustment disorder with anxiety: Secondary | ICD-10-CM

## 2023-06-27 NOTE — Progress Notes (Signed)
 Counselor/Therapist Progress Note  Patient ID: Sarah Reese, MRN: [id number],    Date: 06/27/2023  Time Spent: 34 total minutes. I spent 24 minutes on real-time audio with the patient on the date of service. I spent an additional 10 minutes on pre- and post-visit activities on the date of service including collateral, chart review, team discussion, and documentation.    Treatment Type: Individual Therapy  Reported Symptoms:  "fine" stable mood  Mental Status Exam:  Appearance:   NA     Behavior:  Appropriate, Sharing, and Motivated  Motor:  NA  Speech/Language:   Clear and Coherent and Normal Rate  Affect:  NA  Mood:  euthymic  Thought process:  normal  Thought content:    WNL  Sensory/Perceptual disturbances:    WNL  Orientation:  oriented to person, place, time/date, situation, and day of week  Attention:  Good  Concentration:  Good  Memory:  WNL  Fund of knowledge:   Good  Insight:    Good  Judgment:   Good  Impulse Control:  Good   Risk Assessment: Danger to Self:  No Self-injurious Behavior: No Danger to Others: No Duty to Warn:no Physical Aggression / Violence:No  Access to Firearms a concern: No  Gang Involvement:No   Subjective: Patient reports that she is "fine" decrease in stress due to increased stability with job and in intimate partner relationship. She shares that she is feeling good about herself. Patient was engaged and cooperative throughout the session using time effectively to discuss thoughts and feelings. Patient voices she feels like she is making progress towards long term treatment goal. Patient declined to lengthen time between sessions and a follow up appointment was scheduled for one week.       Interventions: Cognitive Behavioral Therapy and Client Centered  LCSW established psychological safety and rapport with the patient. Met with the patient to identify current needs related to stressors and functioning, monitor mood and anxiety  symptoms, and assess safety. Checked in on the patient's well-being since the last follow-up session. Assisted the patient in processing both positive developments and ongoing stressors. Engaged the patient in reviewing the treatment plan and discussing progress toward goals. Reviewed upcoming changes in services to ensure understanding and preparedness. Provided support through active listening, validation of feelings, and by highlighting the patient's strengths.  Diagnosis:   ICD-10-CM   1. Adjustment disorder with anxious mood  F43.22      Plan: Patient's goal of treatment is to learn how to forgive people and to work on anger management.  Goal: Patient will report increased emotional regulation by the end of treatment.  06/27/23 Progressing    -assess relationship challenges -build relationship  -Bringing closure to their past disputes -teach effective communication -explore attachment styles  -assist patient and her partner in developing understanding and support of each others needs -Repairing their relationship -Identifying the root causes of their disputes -Helping them understand their partner's point of view -Developing trust with each partner without alienating one or the other -Teaching them how their actions contribute to conflicts   Treatment Target: Understand the relationship between thoughts, emotions, and behaviors  Goal: Patient will verbalize and demonstrate an understanding of the relationship between thoughts, emotions, and behaviors.  Measure: Observed in sessions by therapist.  -06/27/23 progressing  Psychoeducation on CBTs model   Oriented the client to the therapeutic approach Teach the connection between thoughts, emotions, and behaviors   Treatment Target: Reducing vulnerability to "emotional mind" Goal: Patient will engage  in 3 self-care practices each week.  -06/27/23 progressing  Goal: Identify and clarify top 3 personal values. Measure: Completed  values worksheet and therapist discussion. -06/27/23 progressing  Values clarification   Self-care - nutrition, sleep, exercise   Treatment Target: Increase emotional regulation  Goal: Patient will use distress tolerance and emotional regulation skills in 50% of high-emotion situations. Measure: Weekly self-report and therapist observation. 06/27/23 progressing  Goal: Increase present-moment awareness during 3 routine activities daily. 06/27/23 Not met  Goal: Practice mindfulness meditation for 10 minutes/day, 5 days/week. 06/27/23 Not met  Teach distress tolerance techniques - "what helps me"  Opposite action PLEASE  skills or self-care skills  Self-soothing  Cope ahead skills - imagery, rehearsal, problem-solving, exposure   Assertiveness communication Mindfulness   Future Appointments  Date Time Provider Department Center  07/04/2023 10:00 AM Nyle Belling, LCSW [facility] None   Nyle Belling, LCSW

## 2023-07-04 ENCOUNTER — Ambulatory Visit: Admitting: Licensed Clinical Social Worker

## 2023-07-04 DIAGNOSIS — F4322 Adjustment disorder with anxiety: Secondary | ICD-10-CM

## 2023-07-04 NOTE — Progress Notes (Addendum)
 Counselor/Therapist Progress Note  Patient ID: Sarah Reese, MRN: [id number],    Date: 07/04/2023  Time Spent: 50 total minutes. I spent 45 minutes on real-time audio with the patient on the date of service. I spent an additional 5 minutes on pre- and post-visit activities on the date of service including collateral, chart review, team discussion, and documentation.    Treatment Type: Individual Therapy  Reported Symptoms: Mild anxiety, irritability; Intermittent low mood   Mental Status Exam:  Appearance:   NA     Behavior:  Appropriate, Sharing, and Motivated  Motor:  NA  Speech/Language:   Clear and Coherent and Normal Rate  Affect:  NA  Mood:  normal  Thought process:  normal  Thought content:    WNL  Sensory/Perceptual disturbances:    WNL  Orientation:  oriented to person, place, time/date, situation, and day of week  Attention:  Good  Concentration:  Good  Memory:  WNL  Fund of knowledge:   Good  Insight:    Good  Judgment:   Fair  Impulse Control:  Fair   Risk Assessment: Danger to Self:  No Self-injurious Behavior: No Danger to Others: No Duty to Warn:no Physical Aggression / Violence:No  Access to Firearms a concern: No  Gang Involvement:No   Subjective: Patient shares that there are parts of her life she is feeling better about and other parts (intimate partner conflict), that lead to mood and anxiety challenges. Patient was receptive to feedback and intervention from LCSW and actively and effectively participated throughout the session. Patient is likely to benefit from future treatment because they remain motivated to decrease symptoms and reports benefit of regular sessions and reports that she has grown a lot and is able to notice thoughts and change thoughts and take space to regulate her emotions. She also reports benefit from prayer.     Interventions: Cognitive Behavioral Therapy and Client Centered  LCSW established psychological safety. Met  with patient to assess current needs related to stressors and daily functioning. Monitored for symptoms and assessed for safety. Conducted a follow-up check-in on the patient's well-being since the last session. LCSW assisted patient in processing their thoughts and emotions regarding intimate partner conflict. LCSW intervened with positive regard and optimism to validate patient's emotions. LCSW worked with patient to reframe unhelpful thoughts, supported client in exploring ways to have open communication, and to align behaviors with her values. Provided support through active listening, validation of feelings, and highlighted patient's strengths.  Diagnosis:   ICD-10-CM   1. Adjustment disorder with anxious mood  F43.22      Plan: Patient's goal of treatment is to learn how to forgive people and to work on anger management.  Goal: Patient will report increased emotional regulation by the end of treatment.  06/27/23 Progressing     -assess relationship challenges -build relationship  -Bringing closure to their past disputes -teach effective communication -explore attachment styles  -assist patient and her partner in developing understanding and support of each others needs -Repairing their relationship -Identifying the root causes of their disputes -Helping them understand their partner's point of view -Developing trust with each partner without alienating one or the other -Teaching them how their actions contribute to conflicts   Treatment Target: Understand the relationship between thoughts, emotions, and behaviors  Goal: Patient will verbalize and demonstrate an understanding of the relationship between thoughts, emotions, and behaviors.  Measure: Observed in sessions by therapist.  -06/27/23 progressing  Psychoeducation on CBTs model  Oriented the client to the therapeutic approach Teach the connection between thoughts, emotions, and behaviors    Treatment Target: Reducing  vulnerability to "emotional mind" Goal: Patient will engage in 3 self-care practices each week.  -06/27/23 progressing  Goal: Identify and clarify top 3 personal values. Measure: Completed values worksheet and therapist discussion. -06/27/23 progressing  Values clarification   Self-care - nutrition, sleep, exercise    Treatment Target: Increase emotional regulation  Goal: Patient will use distress tolerance and emotional regulation skills in 50% of high-emotion situations. Measure: Weekly self-report and therapist observation. 06/27/23 progressing  Goal: Increase present-moment awareness during 3 routine activities daily. 06/27/23 Not met  Goal: Practice mindfulness meditation for 10 minutes/day, 5 days/week. 06/27/23 Not met  Teach distress tolerance techniques - "what helps me"  Opposite action PLEASE  skills or self-care skills  Self-soothing  Cope ahead skills - imagery, rehearsal, problem-solving, exposure   Assertiveness communication Mindfulness   Future Appointments  Date Time Provider Department Center  07/11/2023  2:00 PM Nyle Belling, LCSW [facility] None    Nyle Belling, [place]

## 2023-07-11 ENCOUNTER — Ambulatory Visit: Admitting: Licensed Clinical Social Worker

## 2023-07-11 DIAGNOSIS — F4322 Adjustment disorder with anxiety: Secondary | ICD-10-CM

## 2023-07-11 NOTE — Progress Notes (Signed)
 Counselor/Therapist Progress Note  Patient ID: Sarah Reese, MRN: [id number],    Date: 07/11/2023  Time Spent: 51 total minutes. I spent 46 minutes on real-time audio with the patient on the date of service. I spent an additional 5  minutes on pre- and post-visit activities on the date of service including collateral, chart review, team discussion, and documentation.   Treatment Type: Individual Therapy  Reported Symptoms: Current mood irritable, angry   Mental Status Exam:  Appearance:   NA     Behavior:  Appropriate, Sharing, and Motivated  Motor:  NA  Speech/Language:   Clear and Coherent and Normal Rate  Affect:  NA  Mood:  angry  Thought process:  normal  Thought content:    WNL  Sensory/Perceptual disturbances:    WNL  Orientation:  oriented to person, place, time/date, situation, and day of week  Attention:  Good  Concentration:  Good  Memory:  WNL  Fund of knowledge:   Good  Insight:    Good  Judgment:   Good  Impulse Control:  Good   Risk Assessment: Danger to Self:  No Self-injurious Behavior: No Danger to Others: No Duty to Warn:no Physical Aggression / Violence:No  Access to Firearms a concern: No  Gang Involvement:No   Subjective: Patient presents feeling angry and frustrated due to relationship/co-parenting challenges with partner. Patient was engaged and cooperative throughout the session using time effectively to discuss thoughts and feelings. Patient voices continued motivation for treatment and understanding of how stress in relationship impacts her mood. Patient remains motivated for treatment and voices benefit from regular sessions in addressing these symptoms.   Interventions: Cognitive Behavioral Therapy and Client Centered  LCSW established psychological safety. LCSW met with patient to assess current needs related to stressors and daily functioning. Monitored for symptoms of anxiety and depressed mood and assessed for safety. Conducted a  follow-up check-in on the patient's well-being since the last session. LCSW assisted patient in processing their thoughts and emotions regarding relationship challenges/co-parenting challenges. LCSW reviewed self-soothing, including praying, taking a shower, laying on the couch with baby. And also discussed taking space from this relationship.   Diagnosis:   ICD-10-CM   1. Adjustment disorder with anxious mood  F43.22      Plan: Patient's goal of treatment is to learn how to forgive people and to work on anger management.  Goal: Patient will report increased emotional regulation by the end of treatment.  06/27/23 Progressing     -assess relationship challenges -build relationship  -Bringing closure to their past disputes -teach effective communication -explore attachment styles  -assist patient and her partner in developing understanding and support of each others needs -Repairing their relationship -Identifying the root causes of their disputes -Helping them understand their partner's point of view -Developing trust with each partner without alienating one or the other -Teaching them how their actions contribute to conflicts   Treatment Target: Understand the relationship between thoughts, emotions, and behaviors  Goal: Patient will verbalize and demonstrate an understanding of the relationship between thoughts, emotions, and behaviors.  Measure: Observed in sessions by therapist.  -06/27/23 progressing  Psychoeducation on CBTs model   Oriented the client to the therapeutic approach Teach the connection between thoughts, emotions, and behaviors    Treatment Target: Reducing vulnerability to "emotional mind" Goal: Patient will engage in 3 self-care practices each week.  -06/27/23 progressing  Goal: Identify and clarify top 3 personal values. Measure: Completed values worksheet and therapist discussion. -06/27/23 progressing  Values  clarification   Self-care - nutrition, sleep,  exercise    Treatment Target: Increase emotional regulation  Goal: Patient will use distress tolerance and emotional regulation skills in 50% of high-emotion situations. Measure: Weekly self-report and therapist observation. 06/27/23 progressing  Goal: Increase present-moment awareness during 3 routine activities daily. 06/27/23 Not met  Goal: Practice mindfulness meditation for 10 minutes/day, 5 days/week. 06/27/23 Not met  Teach distress tolerance techniques - "what helps me"  Opposite action PLEASE  skills or self-care skills  Self-soothing  Cope ahead skills - imagery, rehearsal, problem-solving, exposure   Assertiveness communication Mindfulness   Future Appointments  Date Time Provider Department Center  07/18/2023  9:00 AM Nyle Belling, LCSW [facility] None    Nyle Belling, [place]

## 2023-07-18 ENCOUNTER — Ambulatory Visit: Admitting: Licensed Clinical Social Worker

## 2023-07-25 ENCOUNTER — Ambulatory Visit: Admitting: Licensed Clinical Social Worker

## 2023-07-25 NOTE — Progress Notes (Unsigned)
 Counselor/Therapist Progress Note  Patient ID: Sarah Reese, MRN: [id number],    Date: 07/25/2023  Time Spent: ## total minutes. I spent ## minutes on real-time audio and video OR face to face with the patient on the date of service. I spent an additional ##  minutes on pre- and post-visit activities on the date of service including collateral, chart review, team discussion, and documentation.   Treatment Type: Individual Therapy  Reported Symptoms: {CHL AMB Reported [phone]}  Mental Status Exam:  Appearance:   {PSY:22683}     Behavior:  {PSY:21022743}  Motor:  {PSY:22302}  Speech/Language:   {PSY:22685}  Affect:  {PSY:22687}  Mood:  {PSY:31886}  Thought process:  {PSY:31888}  Thought content:    {PSY:[id number]}  Sensory/Perceptual disturbances:    {PSY:212-368-9124}  Orientation:  {PSY:30297}  Attention:  {PSY:22877}  Concentration:  {PSY:508-072-3355}  Memory:  {[phone]}  Fund of knowledge:   {PSY:508-072-3355}  Insight:    {PSY:508-072-3355}  Judgment:   {PSY:508-072-3355}  Impulse Control:  {PSY:[id number]}   Risk Assessment: Danger to Self:  {PSY:22692} Self-injurious Behavior: {PSY:22692} Danger to Others: {PSY:22692} Duty to Warn:{PSY:311194} Physical Aggression / Violence:{PSY:21197} Access to Firearms a concern: {PSY:21197} Gang Involvement:{PSY:21197}  Subjective: ***   Interventions: {PSY:[id number]}  Diagnosis:No diagnosis found.  Plan: Plan: Patient's goal of treatment is to learn how to forgive people and to work on anger management.  Goal: Patient will report increased emotional regulation by the end of treatment.  06/27/23 Progressing     -assess relationship challenges -build relationship  -Bringing closure to their past disputes -teach effective communication -explore attachment styles  -assist patient and her partner in developing understanding and support of each others needs -Repairing their relationship -Identifying the  root causes of their disputes -Helping them understand their partner's point of view -Developing trust with each partner without alienating one or the other -Teaching them how their actions contribute to conflicts   Treatment Target: Understand the relationship between thoughts, emotions, and behaviors  Goal: Patient will verbalize and demonstrate an understanding of the relationship between thoughts, emotions, and behaviors.  Measure: Observed in sessions by therapist.  -06/27/23 progressing  Psychoeducation on CBTs model   Oriented the client to the therapeutic approach Teach the connection between thoughts, emotions, and behaviors    Treatment Target: Reducing vulnerability to "emotional mind" Goal: Patient will engage in 3 self-care practices each week.  -06/27/23 progressing  Goal: Identify and clarify top 3 personal values. Measure: Completed values worksheet and therapist discussion. -06/27/23 progressing  Values clarification   Self-care - nutrition, sleep, exercise    Treatment Target: Increase emotional regulation  Goal: Patient will use distress tolerance and emotional regulation skills in 50% of high-emotion situations. Measure: Weekly self-report and therapist observation. 06/27/23 progressing  Goal: Increase present-moment awareness during 3 routine activities daily. 06/27/23 Not met  Goal: Practice mindfulness meditation for 10 minutes/day, 5 days/week. 06/27/23 Not met  Teach distress tolerance techniques - "what helps me"  Opposite action PLEASE  skills or self-care skills  Self-soothing  Cope ahead skills - imagery, rehearsal, problem-solving, exposure   Assertiveness communication Mindfulness    Nyle Belling, LCSW

## 2023-08-01 ENCOUNTER — Ambulatory Visit: Admitting: Licensed Clinical Social Worker

## 2023-08-01 DIAGNOSIS — F4322 Adjustment disorder with anxiety: Secondary | ICD-10-CM

## 2023-08-01 NOTE — Progress Notes (Signed)
 Counselor/Therapist Progress Note  Patient ID: Sarah Reese, MRN: [id number],    Date: 08/01/2023  Time Spent: 33 total minutes. I spent 28 minutes on real-time audio with the patient on the date of service. I spent an additional 5  minutes on pre- and post-visit activities on the date of service including collateral, chart review, team discussion, and documentation.     Treatment Type: Psychotherapy  Reported Symptoms: Overall stable mood; irritable mood secondary to relationship distress   Mental Status Exam:  Appearance:   NA     Behavior:  Appropriate, Sharing, and Motivated  Motor:  NA  Speech/Language:   Clear and Coherent and Normal Rate  Affect:  NA  Mood:  normal  Thought process:  normal  Thought content:    WNL  Sensory/Perceptual disturbances:    WNL  Orientation:  oriented to person, place, time/date, situation, and day of week  Attention:  Good  Concentration:  Good  Memory:  WNL  Fund of knowledge:   Good  Insight:    Fair  Judgment:   Fair  Impulse Control:  Fair   Risk Assessment: Danger to Self:  No Self-injurious Behavior: No Danger to Others: No Duty to Warn:no Physical Aggression / Violence:No  Access to Firearms a concern: No  Gang Involvement:No   Subjective: Patient shares continued relationship stress but shares her mood has been fine. She reports she has been "mean" to her partner saying things she doesn't mean. She also shares that she has used mindfulness breathing and taking space to reset to manage her emotions. Patient was engaged and cooperative throughout the session using time effectively to discuss thoughts, feelings, and coping strategies. She voices continued motivation for treatment. Patient voices understanding that she will have to register as a patient before or after July 1st.   Interventions: Cognitive Behavioral Therapy and Client Centered  LCSW established psychological safety. LCSW met with patient to identify needs  related to stressors and functioning, and assess and monitor for signs and symptoms of anxiety and depressed mood, and assess safety. Checked in with patient regarding how they have been doing since the last follow-up session. LCSW assisted patient in processing their thoughts and emotions regarding relationship conflict. Provided support through active listening, validation of feelings, and highlighted patient's strengths. Reframed unhelpful thoughts, reviewed assertiveness communication, the importance of self-care, and mindfulness breathing. LCSW discussed with patient [facility]'s plans to begin billing for [facility] services and informed patient that she will need to come in to register, a new assessment will need to be completed and a new treatment plan will need to be developed.   Diagnosis:   ICD-10-CM   1. Adjustment disorder with anxious mood  F43.22      Plan: Patient's goal of treatment is to learn how to forgive people and to work on anger management.  Goal: Patient will report increased emotional regulation by the end of treatment.  06/27/23 Progressing     -assess relationship challenges -build relationship  -Bringing closure to their past disputes -teach effective communication -explore attachment styles  -assist patient and her partner in developing understanding and support of each others needs -Repairing their relationship -Identifying the root causes of their disputes -Helping them understand their partner's point of view -Developing trust with each partner without alienating one or the other -Teaching them how their actions contribute to conflicts   Treatment Target: Understand the relationship between thoughts, emotions, and behaviors  Goal: Patient will verbalize and demonstrate an understanding of the  relationship between thoughts, emotions, and behaviors.  Measure: Observed in sessions by therapist.  -06/27/23 progressing  Psychoeducation on CBTs model   Oriented the client to  the therapeutic approach Teach the connection between thoughts, emotions, and behaviors    Treatment Target: Reducing vulnerability to "emotional mind" Goal: Patient will engage in 3 self-care practices each week.  -06/27/23 progressing  Goal: Identify and clarify top 3 personal values. Measure: Completed values worksheet and therapist discussion. -06/27/23 progressing  Values clarification   Self-care - nutrition, sleep, exercise    Treatment Target: Increase emotional regulation  Goal: Patient will use distress tolerance and emotional regulation skills in 50% of high-emotion situations. Measure: Weekly self-report and therapist observation. 06/27/23 progressing  Goal: Increase present-moment awareness during 3 routine activities daily. 06/27/23 Not met  Goal: Practice mindfulness meditation for 10 minutes/day, 5 days/week. 06/27/23 Not met  Teach distress tolerance techniques - "what helps me"  Opposite action PLEASE  skills or self-care skills  Self-soothing  Cope ahead skills - imagery, rehearsal, problem-solving, exposure   Assertiveness communication Mindfulness   Future Appointments  Date Time Provider Department Center  08/15/2023  2:00 PM Nyle Belling, LCSW [facility] None    Nyle Belling, [place]

## 2023-08-15 ENCOUNTER — Ambulatory Visit: Admitting: Licensed Clinical Social Worker

## 2023-08-15 DIAGNOSIS — F4322 Adjustment disorder with anxiety: Secondary | ICD-10-CM

## 2023-08-15 NOTE — Progress Notes (Signed)
 Counselor/Therapist Progress Note  Patient ID: Sarah Reese, MRN: [id number],    Date: 08/15/2023  Time Spent: 45 total minutes. I spent 40 minutes on real-time audio with the patient on the date of service. I spent an additional 5 minutes on pre- and post-visit activities on the date of service including collateral, chart review, team discussion, and documentation.   Treatment Type: Individual Therapy  Reported Symptoms: Overall stable mood   Mental Status Exam:  Appearance:   NA     Behavior:  Appropriate, Sharing, and Motivated  Motor:  NA  Speech/Language:   Clear and Coherent and Normal Rate  Affect:  NA  Mood:  normal  Thought process:  normal  Thought content:    WNL  Sensory/Perceptual disturbances:    WNL  Orientation:  oriented to person, place, time/date, situation, and day of week  Attention:  Good  Concentration:  Good  Memory:  WNL  Fund of knowledge:   Good  Insight:    Fair  Judgment:   Fair  Impulse Control:  Fair   Risk Assessment: Danger to Self:  No Self-injurious Behavior: No Danger to Others: No Duty to Warn:no Physical Aggression / Violence:No  Access to Firearms a concern: No  Gang Involvement:No   Subjective: Patient shared that she had a bad day yesterday due to an upsetting incident with her son, though she reported feeling alright otherwise. She expressed that, aside from that event, things in her life are going well. During the session, she chose to focus on aspects of her life that she feels good about and spoke positively about recent improvements in her relationship with her partner. She was engaged and cooperative throughout the session, using the time effectively to explore her thoughts and emotions. She was receptive to the feedback and interventions offered by the LCSW.  Interventions: Cognitive Behavioral Therapy and Client Centered  LCSW established psychological safety and met with the patient to assess current needs  related to stressors and daily functioning. Monitored for symptoms of anxiety and depressed mood, and assessed for safety. Conducted a follow-up check-in on the patient's well-being since the previous session. Assisted the patient in processing her thoughts and emotions related to a recent challenge with her son, as well as her sense that her life is currently in a better place. Provided support through active listening, validation of feelings, and facilitation of deeper emotional exploration. Highlighted the patient's strengths to reinforce progress and resilience, utilizing a strengths-based CBT approach to support self-efficacy and maintain positive behavioral and cognitive change.  Diagnosis:   ICD-10-CM   1. Adjustment disorder with anxious mood  F43.22      Plan: Patient's goal of treatment is to learn how to forgive people and to work on anger management.  Goal: Patient will report increased emotional regulation by the end of treatment.  06/27/23 Progressing     -assess relationship challenges -build relationship  -Bringing closure to their past disputes -teach effective communication -explore attachment styles  -assist patient and her partner in developing understanding and support of each others needs -Repairing their relationship -Identifying the root causes of their disputes -Helping them understand their partner's point of view -Developing trust with each partner without alienating one or the other -Teaching them how their actions contribute to conflicts   Treatment Target: Understand the relationship between thoughts, emotions, and behaviors  Goal: Patient will verbalize and demonstrate an understanding of the relationship between thoughts, emotions, and behaviors.  Measure: Observed in sessions by therapist.  -  06/27/23 progressing  08/15/23 completed  Psychoeducation on CBTs model   Oriented the client to the therapeutic approach Teach the connection between thoughts,  emotions, and behaviors    Treatment Target: Reducing vulnerability to "emotional mind" Goal: Patient will engage in 3 self-care practices each week.  -06/27/23 progressing  08/15/23 progressing  Goal: Identify and clarify top 3 personal values. Measure: Completed values worksheet and therapist discussion. -06/27/23 progressing  08/15/23 progressing  Values clarification   Self-care - nutrition, sleep, exercise    Treatment Target: Increase emotional regulation  Goal: Patient will use distress tolerance and emotional regulation skills in 50% of high-emotion situations. Measure: Weekly self-report and therapist observation. 06/27/23 progressing  08/15/23 progressing  Goal: Increase present-moment awareness during 3 routine activities daily. 06/27/23 Not met  08/15/23 Not met Goal: Practice mindfulness meditation for 10 minutes/day, 5 days/week. 06/27/23 Not met  08/15/23 Not met Teach distress tolerance techniques - "what helps me"  Opposite action PLEASE  skills or self-care skills  Self-soothing  Cope ahead skills - imagery, rehearsal, problem-solving, exposure   Assertiveness communication Mindfulness   Future Appointments  Date Time Provider Department Center  08/30/2023  2:00 PM Ellender Palma, LCSW [facility] None    Palma Ellender, [place]

## 2023-08-30 ENCOUNTER — Ambulatory Visit: Payer: Self-pay | Admitting: Licensed Clinical Social Worker

## 2023-08-30 DIAGNOSIS — F4322 Adjustment disorder with anxiety: Secondary | ICD-10-CM
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
## 2023-09-12 ENCOUNTER — Ambulatory Visit: Admitting: Licensed Clinical Social Worker

## 2023-09-25 ENCOUNTER — Ambulatory Visit: Admitting: Licensed Clinical Social Worker

## 2023-09-25 DIAGNOSIS — F4322 Adjustment disorder with anxiety: Secondary | ICD-10-CM | POA: Diagnosis not present

## 2023-09-25 NOTE — Progress Notes (Signed)
 Counselor/Therapist Progress Note  Patient ID: Sarah Reese, MRN: [id number],    Date: 09/25/2023  Time Spent: 52 total minutes. I spent 47 minutes on real-time audio with the patient on the date of service. I spent an additional 5 minutes on pre- and post-visit activities on the date of service including collateral, chart review, team discussion, and documentation.   Treatment Type: Individual Therapy  Reported Symptoms: Anxiety, anxious thoughts  Mental Status Exam:  Appearance:   NA     Behavior:  Appropriate, Sharing, and Motivated  Motor:  NA  Speech/Language:   Clear and Coherent and Normal Rate  Affect:  NA  Mood:  normal  Thought process:  normal  Thought content:    WNL  Sensory/Perceptual disturbances:    WNL  Orientation:  oriented to person, place, time/date, situation, and day of week  Attention:  Good  Concentration:  Good  Memory:  WNL  Fund of knowledge:   Good  Insight:    Fair  Judgment:   Fair  Impulse Control:  Fair   Risk Assessment: Danger to Self:  No Self-injurious Behavior: No Danger to Others: No Duty to Warn:no Physical Aggression / Violence:No  Access to Firearms a concern: No  Gang Involvement:No   Subjective: Patient reports that she is doing okay. She states that she has decided to leave her partner for good and describes the experience as "not hard or easy." She shares feelings of stress and anxiety related to the breakup and recent relationship challenges. Patient appeared reflective and stated that while the decision was difficult, she feels it was necessary. She discussed emotional fluctuations and acknowledged uncertainty about the future but expressed a sense of relief in making a clear decision. Patient was engaged and cooperative throughout the session and participated actively in the discussion.  Interventions: Cognitive Behavioral Therapy and Client Centered  LCSW established psychological safety and rapport with the  patient. Met with the patient to assess current needs related to recent stressors and overall daily functioning. Monitored for mood symptoms of anxiety, and assessed for safety. Conducted a follow-up check-in regarding the patient's well-being and emotional status since the previous session. LCSW assisted the patient in processing thoughts and emotions related to ongoing relationship challenges and the recent breakup. Validated the patient's feelings of stress and frustration, and supported the patient in exploring the pros and cons of ending the relationship. Facilitated problem-solving strategies to enhance behavioral regulation, clarity and decision-making. Discussed ways the patient can foster and engage in positive, supportive relationships during this transitional period. Collaboratively identified a new treatment goal and began development of a treatment plan aligned with the patient's stated needs and values.  Diagnosis:   ICD-10-CM   1. Adjustment disorder with anxious mood  F43.22      Plan: Patient identified her goals for treatment as learning how to make decisions for herself and stand by them without being influenced by others or made to feel guilty. She expressed a desire to advocate for herself more effectively and to handle challenging situations without resorting to anger. She wants to be able to walk away from conflict and manage herself in a calm and positive manner.  Objective 1: Patient will identify and challenge at least 3 unhelpful thinking patterns (e.g., guilt, self-doubt, fear of judgment) related to decision-making using CBT techniques over the next 4 sessions. Interventions: Use CBT worksheets to track and reframe cognitive distortions. Practice guided role-plays in session focused on asserting decisions. Assign between-session exercises to apply  decision-making in real-life situations. Objective 2: Patient will demonstrate use of at least 2 emotion regulation strategies  (e.g., mindfulness, distress tolerance, self-soothing) in response to interpersonal conflict or emotional triggers in 3 out of 4 reported instances over the next 6 sessions. Interventions: Teach emotion regulation, self-soothing and distress tolerance skills. Process emotionally triggering interpersonal situations in session. Encourage use of emotion log or diary card to track emotional triggers and coping responses. Objective 3: Patient will identify and articulate her top 3 personal values (ACT framework) and describe how those values guide at least 2 recent decisions. Interventions: Use ACT values clarification exercises. Facilitate discussion on value-driven behavior vs. emotion-driven behavior. Reinforce self-affirming language and self-compassion.    Future Appointments  Date Time Provider Department Center  10/02/2023  3:00 PM [place], LCSW [facility] None    [place], [place]

## 2023-10-02 ENCOUNTER — Ambulatory Visit: Admitting: Licensed Clinical Social Worker

## 2023-10-09 ENCOUNTER — Ambulatory Visit: Admitting: Licensed Clinical Social Worker

## 2023-10-09 DIAGNOSIS — F4322 Adjustment disorder with anxiety: Secondary | ICD-10-CM | POA: Diagnosis not present

## 2023-10-09 NOTE — Progress Notes (Signed)
 Counselor/Therapist Progress Note  Patient ID: Glendon Fiser, MRN: [id number],    Date: 10/09/2023  Time Spent: 50 total minutes. I spent 45 minutes on real-time audio with the patient on the date of service. I spent an additional 5 minutes on pre- and post-visit activities on the date of service including collateral, chart review, team discussion, and documentation.   Treatment Type: Individual Therapy  Reported Symptoms: Irritability, anxious thoughts; secondary to relationship conflict   Mental Status Exam:  Appearance:   NA     Behavior:  Appropriate, Sharing, and Motivated  Motor:  NA  Speech/Language:   Clear and Coherent and Normal Rate  Affect:  NA  Mood:  normal  Thought process:  normal  Thought content:    WNL  Sensory/Perceptual disturbances:    WNL  Orientation:  oriented to person, place, time/date, situation, and day of week  Attention:  Good  Concentration:  Good  Memory:  WNL  Fund of knowledge:   Good  Insight:    Fair  Judgment:   Fair  Impulse Control:  Fair   Risk Assessment: Danger to Self:  No Self-injurious Behavior: No Danger to Others: No Duty to Warn:no Physical Aggression / Violence:No  Access to Firearms a concern: No  Gang Involvement:No   Subjective: Patient shared that she has made the decision to end her relationship, expressing that she is tired of the ongoing lack of support. The session focused on exploring her thoughts and emotions related to these relationship challenges. She was engaged and cooperative throughout the session, utilizing the time to reflect on her decision and emotional responses.  Interventions: Cognitive Behavioral Therapy and Client Centered  LCSW established psychological safety. Met with patient to assess current needs related to stressors and daily functioning. Monitored for symptoms of depression and anxiety and assessed for safety. Conducted a follow-up check-in on the patient's well-being since the last  session. LCSW supported the patient in processing ongoing thoughts and emotions related to continued relationship conflict. Utilized active listening and validation to create a safe space for emotional expression. Emphasized and reflected the patient's strengths, particularly her insight and ability to set boundaries. Assisted the patient in exploring the pros and cons of continued communication with her ex-partner, particularly in the context of co-parenting concerns and the emotional impact of her ex's repeated requests to reconcile. Guided the patient in identifying her priorities and values to support clarity and decision-making. Reviewed mindfulness strategies, including noticing felt sensations and emotions without judgment, and engaging the five senses to promote grounding and emotional regulation. LCSW reviewed treatment plan.   Diagnosis:   ICD-10-CM   1. Adjustment disorder with anxious mood  F43.22      Plan: Patient identified her goals for treatment as learning how to make decisions for herself and stand by them without being influenced by others or made to feel guilty. She expressed a desire to advocate for herself more effectively and to handle challenging situations without resorting to anger. She wants to be able to walk away from conflict and manage herself in a calm and positive manner.   Objective 1: Patient will identify and challenge at least 3 unhelpful thinking patterns (e.g., guilt, self-doubt, fear of judgment) related to decision-making using CBT techniques over the next 4 sessions. Interventions: Use CBT worksheets to track and reframe cognitive distortions. Practice guided role-plays in session focused on asserting decisions. Assign between-session exercises to apply decision-making in real-life situations.  Objective 2: Patient will demonstrate use of at  least 2 emotion regulation strategies (e.g., mindfulness, distress tolerance, self-soothing) in response to  interpersonal conflict or emotional triggers in 3 out of 4 reported instances over the next 6 sessions. Interventions: Teach emotion regulation, self-soothing and distress tolerance skills. Process emotionally triggering interpersonal situations in session. Encourage use of emotion log or diary card to track emotional triggers and coping responses.  Objective 3: Patient will identify and articulate her top 3 personal values (ACT framework) and describe how those values guide at least 2 recent decisions. Interventions: Use ACT values clarification exercises. Facilitate discussion on value-driven behavior vs. emotion-driven behavior. Reinforce self-affirming language and self-compassion.  Future Appointments  Date Time Provider Department Center  10/16/2023 10:00 AM [place], LCSW [facility] None    [place], [place]

## 2023-10-16 ENCOUNTER — Ambulatory Visit: Admitting: Licensed Clinical Social Worker

## 2023-10-16 DIAGNOSIS — F4322 Adjustment disorder with anxiety: Secondary | ICD-10-CM

## 2023-10-16 NOTE — Progress Notes (Signed)
 Counselor/Therapist Progress Note  Patient ID: Sarah Reese, MRN: [id number],    Date: 10/16/2023  Time Spent: 28 total minutes. I spent 23 minutes on real-time audio with the patient on the date of service. I spent an additional 5 minutes on pre- and post-visit activities on the date of service including collateral, chart review, team discussion, and documentation.   Session was short due to patient's request.   Treatment Type: Individual Therapy  Reported Symptoms: Low energy, tired, anxiety, irritability, frustrated; depressed mood   Mental Status Exam:  Appearance:   NA     Behavior:  Appropriate, Sharing, and Motivated  Motor:  NA  Speech/Language:   Clear and Coherent and Normal Rate  Affect:  NA  Mood:  irritable  Thought process:  normal  Thought content:    WNL  Sensory/Perceptual disturbances:    WNL  Orientation:  oriented to person, place, time/date, situation, and day of week  Attention:  Good  Concentration:  Good  Memory:  WNL  Fund of knowledge:   Good  Insight:    Fair  Judgment:   Fair  Impulse Control:  Fair   Risk Assessment: Danger to Self:  No Self-injurious Behavior: No Danger to Others: No Duty to Warn:no Physical Aggression / Violence:No  Access to Firearms a concern: No  Gang Involvement:No   Subjective: Patient reports she is extremely tired and reports she feels very stressed due to not sleeping, 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 Patient requested a abbreviate session so she can get some more rest. Despite fatigue, the patient participated throughout the session, openly sharing her thoughts and feelings, and was receptive to LCSW's feedback and intervention.   Interventions: Cognitive Behavioral Therapy and Client Centered  LCSW established psychological safety. LCSW met with patient to identify needs related to stressors and functioning, and assess and monitor for signs and symptoms of anxiety and depression, and assess  safety. LCSW used a blend of CBT and supportive counseling. CBT techniques were used to assist the patient in identifying and processing stress-related thoughts and emotions, understanding the role of sleep in emotional regulation, and exploring practical problem-solving strategies for childcare and support. Supportive counseling was provided to validate the patient's experiences, encourage adaptive coping, and strengthen her support network, including communication with the baby's other parent.  Diagnosis:   ICD-10-CM   1. Adjustment disorder with anxious mood  F43.22      Plan: Patient identified her goals for treatment as learning how to make decisions for herself and stand by them without being influenced by others or made to feel guilty. She expressed a desire to advocate for herself more effectively and to handle challenging situations without resorting to anger. She wants to be able to walk away from conflict and manage herself in a calm and positive manner.   Objective 1: Patient will identify and challenge at least 3 unhelpful thinking patterns (e.g., guilt, self-doubt, fear of judgment) related to decision-making using CBT techniques over the next 4 sessions. Interventions: Use CBT worksheets to track and reframe cognitive distortions. Practice guided role-plays in session focused on asserting decisions. Assign between-session exercises to apply decision-making in real-life situations.   Objective 2: Patient will demonstrate use of at least 2 emotion regulation strategies (e.g., mindfulness, distress tolerance, self-soothing) in response to interpersonal conflict or emotional triggers in 3 out of 4 reported instances over the next 6 sessions. Interventions: Teach emotion regulation, self-soothing and distress tolerance skills. Process emotionally triggering interpersonal situations  in session. Encourage use of emotion log or diary card to track emotional triggers and coping  responses.   Objective 3: Patient will identify and articulate her top 3 personal values (ACT framework) and describe how those values guide at least 2 recent decisions. Interventions: Use ACT values clarification exercises. Facilitate discussion on value-driven behavior vs. emotion-driven behavior. Reinforce self-affirming language and self-compassion.  Future Appointments  Date Time Provider Department Center  10/31/2023  3:00 PM [place], LCSW [facility] None   [place], [place]

## 2023-10-16 NOTE — Progress Notes (Unsigned)
 Counselor/Therapist Progress Note  Patient ID: Sarah Reese, MRN: [id number],    Date: 10/16/2023  Time Spent: ## total minutes. I spent ## minutes on real-time audio with the patient on the date of service. I spent an additional 5 minutes on pre- and post-visit activities on the date of service including collateral, chart review, team discussion, and documentation.     Treatment Type: Individual Therapy  Reported Symptoms: {CHL AMB Reported Symptoms:[id number]}  Mental Status Exam:  Appearance:   {PSY:22683}     Behavior:  {PSY:21022743}  Motor:  {PSY:22302}  Speech/Language:   {PSY:22685}  Affect:  {PSY:22687}  Mood:  {PSY:31886}  Thought process:  {PSY:31888}  Thought content:    {[phone]}  Sensory/Perceptual disturbances:    {[phone]}  Orientation:  {PSY:30297}  Attention:  {PSY:22877}  Concentration:  {[phone]}  Memory:  {PSY:[phone]}  Fund of knowledge:   {[phone]}  Insight:    {[phone]}  Judgment:   {[phone]}  Impulse Control:  {[phone]}   Risk Assessment: Danger to Self:  No Self-injurious Behavior: No Danger to Others: No Duty to Warn:no Physical Aggression / Violence:No  Access to Firearms a concern: No  Gang Involvement:No   Subjective: Patient reports     Interventions: Cognitive Behavioral Therapy and Client Centered  LCSW established psychological safety. LCSW met with patient to identify needs related to stressors and functioning, and assess and monitor for signs and symptoms of and , and assess safety. Checked in with patient regarding how they have been doing since the last follow-up session.   Met with patient to assess current needs related to stressors and daily functioning. Monitored for symptoms of and assessed for safety. Conducted a follow-up check-in on the patient's well-being since the last session.   LCSW assisted patient in processing their thoughts and emotions regarding ***    LCSW provided psychoeducation and therapeutic intervention regarding coping with ***   Provided support through active listening, validation of feelings, and highlighted patient's strengths.  Engaged patient in processing current psychosocial stressors  LCSW intervened with positive regard and optimism to validate client's emotions, and supported client in exploring ways to increase her intentional use of self care  Used guided discovery to explore patient's perception of relationship challenges asking patient to identify supportive evidence for thoughts and against thoughts, encouraging a broader perspective.  Reviewed previous session regarding   LCSW assisted, actively listened, taught, shared, role/played, provided  LCSW reviewed behavior modification, distress tolerance and effective communication skills with patient.   Provided supportive space encouraging emotional release and processing of current psychosocial stressors  Diagnosis:   ICD-10-CM   1. Adjustment disorder with anxious mood  F43.22       Plan: Patient identified her goals for treatment as learning how to make decisions for herself and stand by them without being influenced by others or made to feel guilty. She expressed a desire to advocate for herself more effectively and to handle challenging situations without resorting to anger. She wants to be able to walk away from conflict and manage herself in a calm and positive manner.   Objective 1: Patient will identify and challenge at least 3 unhelpful thinking patterns (e.g., guilt, self-doubt, fear of judgment) related to decision-making using CBT techniques over the next 4 sessions. Interventions: Use CBT worksheets to track and reframe cognitive distortions. Practice guided role-plays in session focused on asserting decisions. Assign between-session exercises to apply decision-making in real-life situations.   Objective 2: Patient will demonstrate use of at  least 2  emotion regulation strategies (e.g., mindfulness, distress tolerance, self-soothing) in response to interpersonal conflict or emotional triggers in 3 out of 4 reported instances over the next 6 sessions. Interventions: Teach emotion regulation, self-soothing and distress tolerance skills. Process emotionally triggering interpersonal situations in session. Encourage use of emotion log or diary card to track emotional triggers and coping responses.   Objective 3: Patient will identify and articulate her top 3 personal values (ACT framework) and describe how those values guide at least 2 recent decisions. Interventions: Use ACT values clarification exercises. Facilitate discussion on value-driven behavior vs. emotion-driven behavior. Reinforce self-affirming language and self-compassion.  Future Appointments  Date Time Provider Department Center  10/16/2023 10:00 AM [place], LCSW [facility] None    [place], [place]

## 2023-10-30 ENCOUNTER — Ambulatory Visit: Admitting: Licensed Clinical Social Worker

## 2023-10-31 ENCOUNTER — Ambulatory Visit: Admitting: Licensed Clinical Social Worker

## 2023-10-31 DIAGNOSIS — F4322 Adjustment disorder with anxiety: Secondary | ICD-10-CM | POA: Diagnosis not present

## 2023-10-31 NOTE — Progress Notes (Signed)
 Counselor/Therapist Progress Note  Patient ID: Sarah Reese, MRN: [id number],    Date: 10/31/2023  Time Spent: 50 total minutes. I spent 45 minutes on real-time audio and video with the patient on the date of service. I spent an additional 5  minutes on pre- and post-visit activities on the date of service including collateral, chart review, team discussion, and documentation.   Treatment Type: Individual Therapy  Reported Symptoms: Intermittent depressed mood, sadness, anxiety, anxious thoughts   Mental Status Exam:  Appearance:   NA     Behavior:  Appropriate, Sharing, and Motivated  Motor:  NA  Speech/Language:   Clear and Coherent and Normal Rate  Affect:  NA  Mood:  normal  Thought process:  normal  Thought content:    WNL  Sensory/Perceptual disturbances:    WNL  Orientation:  oriented to person, place, time/date, situation, and day of week  Attention:  Good  Concentration:  Good  Memory:  WNL  Fund of knowledge:   Good  Insight:    Fair  Judgment:   Fair  Impulse Control:  Fair   Risk Assessment: Danger to Self:  No Self-injurious Behavior: No Danger to Others: No Duty to Warn:no Physical Aggression / Violence:No  Access to Firearms a concern: No  Gang Involvement:No   Subjective: Patient reports being in a very difficult place over the past two weeks without therapy. She states that although she has attempted to use the coping tools discussed in therapy, it has been challenging. She acknowledges allowing herself to fully feel her emotions and sadness while trying to maintain balanced thinking.  During the session, the patient was engaged and cooperative, openly sharing her thoughts and feelings. She actively participated in a somatic resourcing exercise.   Interventions: Cognitive Behavioral Therapy, Mindfulness Meditation, and Client Centered  LCSW established psychological safety and engaged with the patient to identify current stressors impacting her  functioning, while assessing and monitoring symptoms and safety concerns. LCSW checked in with the patient regarding how she has been since the previous session. LCSW supported the patient in processing thoughts and emotions related to ongoing psychosocial stressors, including difficulties with her ex-partner and co-parenting dynamics. Throughout the session, LCSW demonstrated active listening and provided empathetic, validating, and hopeful responses. Collaboratively, LCSW and patient reviewed her use of previously learned coping strategies, including emotional acceptance and cognitive reframing. Additionally, psychoeducation on somatic resourcing techniques was provided, followed by a guided "hand on heart" resourcing exercise to promote self-regulation and grounding.  Diagnosis:   ICD-10-CM   1. Adjustment disorder with anxious mood  F43.22      Plan: Patient identified her goals for treatment as learning how to make decisions for herself and stand by them without being influenced by others or made to feel guilty. She expressed a desire to advocate for herself more effectively and to handle challenging situations without resorting to anger. She wants to be able to walk away from conflict and manage herself in a calm and positive manner.   Objective 1: Patient will identify and challenge at least 3 unhelpful thinking patterns (e.g., guilt, self-doubt, fear of judgment) related to decision-making using CBT techniques over the next 4 sessions. Interventions: Use CBT worksheets to track and reframe cognitive distortions. Practice guided role-plays in session focused on asserting decisions. Assign between-session exercises to apply decision-making in real-life situations.   Objective 2: Patient will demonstrate use of at least 2 emotion regulation strategies (e.g., mindfulness, distress tolerance, self-soothing) in response to interpersonal conflict  or emotional triggers in 3 out of 4 reported  instances over the next 6 sessions. Interventions: Teach emotion regulation, self-soothing and distress tolerance skills. Process emotionally triggering interpersonal situations in session. Encourage use of emotion log or diary card to track emotional triggers and coping responses.   Objective 3: Patient will identify and articulate her top 3 personal values (ACT framework) and describe how those values guide at least 2 recent decisions. Interventions: Use ACT values clarification exercises. Facilitate discussion on value-driven behavior vs. emotion-driven behavior. Reinforce self-affirming language and self-compassion.  Future Appointments  Date Time Provider Department Center  11/07/2023  4:30 PM Ell[place], LCSW [facility] None    [place], [place]

## 2023-11-07 ENCOUNTER — Ambulatory Visit: Admitting: Licensed Clinical Social Worker

## 2023-11-07 DIAGNOSIS — F4322 Adjustment disorder with anxiety: Secondary | ICD-10-CM

## 2023-11-07 NOTE — Progress Notes (Signed)
 Counselor/Therapist Progress Note  Patient ID: Sarah Reese, MRN: [id number],    Date: 11/07/2023  Time Spent: 48 total minutes. I spent 43 minutes on real-time audio with the patient on the date of service. I spent an additional 5 minutes on pre- and post-visit activities on the date of service including collateral, chart review, team discussion, and documentation.   Treatment Type: Individual Therapy  Reported Symptoms: mood stability   Mental Status Exam:  Appearance:   NA     Behavior:  Appropriate and Sharing  Motor:  NA  Speech/Language:   Clear and Coherent and Normal Rate  Affect:  NA  Mood:  normal  Thought process:  normal  Thought content:    WNL  Sensory/Perceptual disturbances:    WNL  Orientation:  oriented to person, place, time/date, situation, and day of week  Attention:  Good  Concentration:  Good  Memory:  WNL  Fund of knowledge:   Good  Insight:    Fair  Judgment:   Fair  Impulse Control:  Fair   Risk Assessment: Danger to Self:  No Self-injurious Behavior: No Danger to Others: No Duty to Warn:no Physical Aggression / Violence:No  Access to Firearms a concern: No  Gang Involvement:No   Subjective: Patient reports feeling "okay." She continues to experience challenges with her ex-partner and co-parenting dynamics but 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  Patient was engaged and cooperative throughout the session.  Interventions: Cognitive Behavioral Therapy and Client Centered  LCSW established psychological safety at the start of the session. Met with the patient to identify needs related to current stressors and overall functioning, assess and monitor symptoms, and evaluate safety. Conducted a check-in regarding how the patient has been doing since the last follow-up session. Provided a supportive space  that encouraged emotional release and processing of current psychosocial stressors, specifically relationship challenges and co-parenting difficulties with her ex-partner. Intervened with positive regard and optimism to validate the patient's emotions and highlighted patient's helpful thoughts and boundary setting. Supported the patient in exploring strategies to intentionally increase her use of self-care practices. Assisted the patient in identifying positives and strengths within her situation and encouraged her continued use of self-care.  Diagnosis:   ICD-10-CM   1. Adjustment disorder with anxious mood  F43.22      Plan: Patient identified her goals for treatment as learning how to make decisions for herself and stand by them without being influenced by others or made to feel guilty. She expressed a desire to advocate for herself more effectively and to handle challenging situations without resorting to anger. She wants to be able to walk away from conflict and manage herself in a calm and positive manner.   Objective 1: Patient will identify and challenge at least 3 unhelpful thinking patterns (e.g., guilt, self-doubt, fear of judgment) related to decision-making using CBT techniques over the next 4 sessions. Interventions: Use CBT worksheets to track and reframe cognitive distortions. Practice guided role-plays in session focused on asserting decisions. Assign between-session exercises to apply decision-making in real-life situations.   Objective 2: Patient will demonstrate use of at least 2 emotion regulation strategies (e.g., mindfulness, distress tolerance, self-soothing) in response to interpersonal conflict or emotional triggers in 3 out of 4 reported instances over the next 6 sessions. Interventions: Teach emotion regulation, self-soothing and distress tolerance skills. Process emotionally triggering interpersonal situations in  session. Encourage use of emotion log or diary card to  track emotional triggers and coping responses.   Objective 3: Patient will identify and articulate her top 3 personal values (ACT framework) and describe how those values guide at least 2 recent decisions. Interventions: Use ACT values clarification exercises. Facilitate discussion on value-driven behavior vs. emotion-driven behavior. Reinforce self-affirming language and self-compassion.  No future appointments. Patient to reach out to schedule next appointment.   Alan Hail, LCSW

## 2023-11-28 ENCOUNTER — Ambulatory Visit: Admitting: Licensed Clinical Social Worker

## 2023-11-28 DIAGNOSIS — F4322 Adjustment disorder with anxiety: Secondary | ICD-10-CM | POA: Diagnosis not present

## 2023-11-28 NOTE — Progress Notes (Signed)
 Counselor/Therapist Progress Note  Patient ID: Sarah Reese, MRN: [id number],    Date: 11/28/2023  Time Spent: 60 total minutes. I spent 53 minutes on real-time audio and video with the patient on the date of service. I spent an additional 7 minutes on pre- and post-visit activities on the date of service including collateral, chart review, team discussion, and documentation.   Treatment Type: Individual Therapy  Reported Symptoms: Overall mood stability; intermittent anxiety, anxious thoughts   Mental Status Exam:  Appearance:   NA     Behavior:  Appropriate, Sharing, and Motivated  Motor:  NA  Speech/Language:   Clear and Coherent and Normal Rate  Affect:  NA  Mood:  euthymic  Thought process:  normal  Thought content:    WNL  Sensory/Perceptual disturbances:    WNL  Orientation:  oriented to person, place, time/date, situation, and day of week  Attention:  Good  Concentration:  Good  Memory:  WNL  Fund of knowledge:   Good  Insight:    Fair  Judgment:   Fair  Impulse Control:  Fair   Risk Assessment: Danger to Self:  No Self-injurious Behavior: No Danger to Others: No Duty to Warn:no Physical Aggression / Violence:No  Access to Firearms a concern: No  Gang Involvement:No   Subjective: Patient reports feeling very excited and feeling overall good. 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 Despite her positive mood, she continues to experience stress related to her recent breakup and ongoing co-parenting challenges with her ex-partner. She reports using skills such as radical acceptance and is actively working on setting and maintaining healthy boundaries.  Interventions: Cognitive Behavioral Therapy and Client Centered  LCSW established psychological safety and rapport with the patient. Met with the patient to identify current needs related to ongoing stressors  and daily functioning. Assessed and monitored for signs and symptoms of anxiety, depressed mood, and safety concerns. Conducted a check-in regarding the patient's well-being since the last session. LCSW supported the patient in processing her emotions related to the excitement of being approved as a surrogate, as well as the ongoing emotional challenges stemming from her recent breakup and co-parenting dynamics. Validated the patient's feelings of grief and frustration regarding interactions with her ex-partner.  Reviewed the concept of radical acceptance and reinforced the patient's effective use of cognitive strategies such as challenging unhelpful thoughts. Introduced the skill of cognitive defusion to help the patient create distance from distressing thoughts. Additionally, discussed the therapeutic benefits of journaling, as the patient shared that writing has been a helpful coping tool for her.  Diagnosis:   ICD-10-CM   1. Adjustment disorder with anxious mood  F43.22      Plan: Patient identified her goals for treatment as learning how to make decisions for herself and stand by them without being influenced by others or made to feel guilty. She expressed a desire to advocate for herself more effectively and to handle challenging situations without resorting to anger. She wants to be able to walk away from conflict and manage herself in a calm and positive manner.   Objective 1: Patient will identify and challenge at least 3 unhelpful thinking patterns (e.g., guilt, self-doubt, fear of judgment) related to decision-making using CBT techniques over the next 4 sessions. Interventions: Use CBT worksheets to track and reframe cognitive distortions. Practice guided role-plays in session focused on asserting decisions. Assign between-session exercises to apply  decision-making in real-life situations.   Objective 2: Patient will demonstrate use of at least 2 emotion regulation strategies (e.g.,  mindfulness, distress tolerance, self-soothing) in response to interpersonal conflict or emotional triggers in 3 out of 4 reported instances over the next 6 sessions. Interventions: Teach emotion regulation, self-soothing and distress tolerance skills. Process emotionally triggering interpersonal situations in session. Encourage use of emotion log or diary card to track emotional triggers and coping responses.   Objective 3: Patient will identify and articulate her top 3 personal values (ACT framework) and describe how those values guide at least 2 recent decisions. Interventions: Use ACT values clarification exercises. Facilitate discussion on value-driven behavior vs. emotion-driven behavior. Reinforce self-affirming language and self-compassion.  Objective 4:  Patient will demonstrate the ability to use cognitive defusion techniques to reduce the intensity of distressing thoughts, as measured by self-report and clinician observation.  Future Appointments  Date Time Provider Department Center  12/12/2023  2:00 PM Ellender Palma, LCSW [facility] None    [place], [place]

## 2023-12-12 ENCOUNTER — Ambulatory Visit: Admitting: Licensed Clinical Social Worker

## 2023-12-12 DIAGNOSIS — F4322 Adjustment disorder with anxiety: Secondary | ICD-10-CM

## 2023-12-12 NOTE — Progress Notes (Signed)
 Counselor/Therapist Progress Note  Patient ID: Sarah Reese, MRN: [id number],    Date: 12/12/2023  Time Spent: 40 total minutes. I spent 33 minutes on real-time audio with the patient on the date of service. I spent an additional 7 minutes on pre- and post-visit activities on the date of service including collateral, chart review, team discussion, and documentation.   Treatment Type: Individual Therapy  Reported Symptoms: Irritability secondary to interactions/conflict with her ex  Mental Status Exam:  Appearance:   NA     Behavior:  Appropriate, Sharing, and Motivated  Motor:  NA  Speech/Language:   Clear and Coherent and Normal Rate  Affect:  NA  Mood:  normal  Thought process:  normal  Thought content:    WNL  Sensory/Perceptual disturbances:    WNL  Orientation:  oriented to person, place, time/date, situation, and day of week  Attention:  Good  Concentration:  Good  Memory:  WNL  Fund of knowledge:   Good  Insight:    Fair  Judgment:   Fair  Impulse Control:  Fair   Risk Assessment: Danger to Self:  No Self-injurious Behavior: No Danger to Others: No Duty to Warn:no Physical Aggression / Violence:No  Access to Firearms a concern: No  Gang Involvement:No   Subjective: Patient reports that her "mood ain't been good" when she is around her ex-partner but states her mood improves when she is not around her. She was engaged and cooperative throughout the session and participated openly in discussing her thoughts and feelings.  Interventions: Cognitive Behavioral Therapy, Motivational Interviewing, and Client Centered, Triple P  LCSW established psychological safety. Met with patient to assess current needs related to stressors and daily functioning. Monitored for symptoms of anxiety and depressed mood and assessed safety. Conducted a follow-up check-in on the patient's well-being since the last session. The LCSW used CBT techniques to assist the patient in  processing thoughts and emotions related to ongoing relationship difficulties with her ex-partner. Together LCSW and patient worked on identifying and challenging core beliefs, differentiating between thoughts and emotions, and exploring how these cognitive patterns influence her feelings and behaviors. Using Motivational Interviewing, the LCSW helped the patient explore her ambivalence around setting boundaries, enhancing her intrinsic motivation by discussing the personal importance of boundaries and supporting her in developing a concrete plan to establish and maintain them.  Additionally, parenting concerns regarding the patient's oldest son were addressed. The LCSW provided parenting support informed by Triple P Teen strategies to help manage problem behaviors effectively.  Plan: Patient identified her goals for treatment as learning how to make decisions for herself and stand by them without being influenced by others or made to feel guilty. She expressed a desire to advocate for herself more effectively and to handle challenging situations without resorting to anger. She wants to be able to walk away from conflict and manage herself in a calm and positive manner.   Objective 1: Patient will identify and challenge at least 3 unhelpful thinking patterns (e.g., guilt, self-doubt, fear of judgment) related to decision-making using CBT techniques over the next 4 sessions. Interventions: Use CBT worksheets to track and reframe cognitive distortions. Practice guided role-plays in session focused on asserting decisions. Assign between-session exercises to apply decision-making in real-life situations.   Objective 2: Patient will demonstrate use of at least 2 emotion regulation strategies (e.g., mindfulness, distress tolerance, self-soothing) in response to interpersonal conflict or emotional triggers in 3 out of 4 reported instances over the next 6 sessions.  Interventions: Teach emotion regulation,  self-soothing and distress tolerance skills. Process emotionally triggering interpersonal situations in session. Encourage use of emotion log or diary card to track emotional triggers and coping responses.   Objective 3: Patient will identify and articulate her top 3 personal values (ACT framework) and describe how those values guide at least 2 recent decisions. Interventions: Use ACT values clarification exercises. Facilitate discussion on value-driven behavior vs. emotion-driven behavior. Reinforce self-affirming language and self-compassion.   Objective 4:  Patient will demonstrate the ability to use cognitive defusion techniques to reduce the intensity of distressing thoughts, as measured by self-report and clinician observation.  Future Appointments  Date Time Provider Department Center  12/26/2023  2:00 PM Ellender Palma, LCSW [facility] None   [place], [place]

## 2023-12-19 ENCOUNTER — Telehealth: Payer: Self-pay | Admitting: Family Medicine

## 2023-12-19 NOTE — Telephone Encounter (Signed)
 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 They are requesting that we write a letter explaining that her heart murmer cleared on its own and she did not need to see a heart Dr for this. Please call patient for more information. This is time sensitive she is requesting that this is done today so that she can be cleared to fly to [place]  this week.

## 2023-12-22 NOTE — Telephone Encounter (Signed)
 Attempted to return pt call.  No voicemail box set up yet.  If patient calls back for advise, please Dr. Eldonna instruction.   [place], RN

## 2023-12-26 ENCOUNTER — Ambulatory Visit: Admitting: Licensed Clinical Social Worker

## 2023-12-26 DIAGNOSIS — F4322 Adjustment disorder with anxiety: Secondary | ICD-10-CM

## 2023-12-26 NOTE — Progress Notes (Signed)
 Counselor/Therapist Progress Note  Patient ID: Porsche Noguchi, MRN: [id number],    Date: 12/26/2023  Time Spent: 33 total minutes. I spent 26 minutes on real-time audio with the patient on the date of service. I spent an additional 7  minutes on pre- and post-visit activities on the date of service including collateral, chart review, team discussion, and documentation.   Treatment Type: Individual Therapy  Reported Symptoms: Mild anxiety   Mental Status Exam:  Appearance:   NA     Behavior:  Appropriate, Sharing, and Motivated  Motor:  NA  Speech/Language:   Clear and Coherent and Normal Rate  Affect:  NA  Mood:  normal  Thought process:  normal  Thought content:    WNL  Sensory/Perceptual disturbances:    WNL  Orientation:  oriented to person, place, time/date, situation, and day of week  Attention:  Good  Concentration:  Good  Memory:  WNL  Fund of knowledge:   Good  Insight:    Fair  Judgment:   Fair  Impulse Control:  Fair   Risk Assessment: Danger to Self:  No Self-injurious Behavior: No Danger to Others: No Duty to Warn:no Physical Aggression / Violence:No  Access to Firearms a concern: No  Gang Involvement:No   Subjective: Patient reports that her mood has been alright. She reports that she feels like she needs some me time and really needs a break.  Patient used session time to process thoughts and emotions.   Interventions: Cognitive Behavioral Therapy and Client Centered  LCSW established psychological safety and met with patient to assess current functioning and coping in relation to ongoing stressors. Mood and anxiety symptoms were monitored, and safety was assessed. Session focused on providing a supportive and validating space for the patient to explore thoughts and emotions related to challenges in the relationship with ex-partner/mother of their son. LCSW utilized active listening, reflection, and validation to facilitate emotional processing  and encourage self-awareness.Patient discussed recent stressors and described strategies, including taking space for herself, focusing on what is going well, and reminding herself of what she deserves which help her with acceptance and emotional balance. Patient noted insight into patterns of response and recognition of progress in managing distress. LCSW reinforced adaptive coping and acknowledged the patient's strengths and ongoing efforts toward acceptance and well-being. LCSW checked-in with patient regarding need for continued therapy and patient continues to report she feels that regular sessions really help her to stay on track.   Diagnosis:   ICD-10-CM   1. Adjustment disorder with anxious mood  F43.22      Plan: Patient identified her goals for treatment as learning how to make decisions for herself and stand by them without being influenced by others or made to feel guilty. She expressed a desire to advocate for herself more effectively and to handle challenging situations without resorting to anger. She wants to be able to walk away from conflict and manage herself in a calm and positive manner.   Objective 1: Patient will identify and challenge at least 3 unhelpful thinking patterns (e.g., guilt, self-doubt, fear of judgment) related to decision-making using CBT techniques over the next 4 sessions. Interventions: Use CBT worksheets to track and reframe cognitive distortions. Practice guided role-plays in session focused on asserting decisions. Assign between-session exercises to apply decision-making in real-life situations.   Objective 2: Patient will demonstrate use of at least 2 emotion regulation strategies (e.g., mindfulness, distress tolerance, self-soothing) in response to interpersonal conflict or emotional triggers in 3  out of 4 reported instances over the next 6 sessions. Interventions: Teach emotion regulation, self-soothing and distress tolerance skills. Process  emotionally triggering interpersonal situations in session. Encourage use of emotion log or diary card to track emotional triggers and coping responses.   Objective 3: Patient will identify and articulate her top 3 personal values (ACT framework) and describe how those values guide at least 2 recent decisions. Interventions: Use ACT values clarification exercises. Facilitate discussion on value-driven behavior vs. emotion-driven behavior. Reinforce self-affirming language and self-compassion.   Objective 4:  Patient will demonstrate the ability to use cognitive defusion techniques to reduce the intensity of distressing thoughts, as measured by self-report and clinician observation.  Future Appointments  Date Time Provider Department Center  01/09/2024  2:00 PM Ellender Palma, LCSW [facility] None    [place], [place]

## 2023-12-30 NOTE — Progress Notes (Deleted)
  Cardiology Office Note   Date:  12/30/2023  ID:  Sarah Reese, DOB Mar 16, 1985, MRN [id number] PCP: [facility]  [facility] Providers Cardiologist:  None { Click to update primary MD,subspecialty MD or APP then REFRESH:1}    History of Present Illness Sarah Reese is a 38 y.o. female no relevant PMH who presents for further evaluation management of a heart murmur.  Patient was recently seen by PCP for this issue.  PCP notes state that patient has been told she has a murmur in the past, though no murmur was auscultated at appointment.  Relevant CVD History -None   ROS: Pt denies any chest discomfort, jaw pain, arm pain, palpitations, syncope, presyncope, orthopnea, PND, or LE edema.  Studies Reviewed I have independently reviewed the patient's ECG, previous medical records.  Physical Exam VS:  There were no vitals taken for this visit.       Wt Readings from Last 3 Encounters:  09/09/22 208 lb (94.3 kg)  09/07/22 207 lb 1.6 oz (93.9 kg)  08/24/22 208 lb (94.3 kg)    GEN: No acute distress. NECK: No JVD; No carotid bruits. CARDIAC: ***RRR, no murmurs, rubs, gallops. RESPIRATORY:  Clear to auscultation. EXTREMITIES:  Warm and well-perfused. No edema.  ASSESSMENT AND PLAN Reported heart murmur ***        {Are you ordering a CV Procedure (e.g. stress test, cath, DCCV, TEE, etc)?   Press F2        :[id number]}  Dispo: ***  Signed, Caron Poser, MD

## 2024-01-01 ENCOUNTER — Telehealth: Payer: Self-pay | Admitting: Licensed Clinical Social Worker

## 2024-01-01 NOTE — Telephone Encounter (Signed)
 Patient emailed LCSW on 11/7 requesting a call back.

## 2024-01-03 ENCOUNTER — Ambulatory Visit

## 2024-01-09 ENCOUNTER — Ambulatory Visit: Admitting: Licensed Clinical Social Worker

## 2024-01-09 DIAGNOSIS — F4322 Adjustment disorder with anxiety: Secondary | ICD-10-CM

## 2024-01-09 NOTE — Progress Notes (Signed)
 Counselor/Therapist Progress Note  Patient ID: Sarah Reese, MRN: [id number],    Date: 01/09/2024  Time Spent: 52 total minutes. I spent 45 minutes on real-time audio with the patient on the date of service. I spent an additional 7  minutes on pre- and post-visit activities on the date of service including collateral, chart review, team discussion, and documentation.   Treatment Type: Individual Therapy  Reported Symptoms: Intermittent anxiety, overwhelm, irritability   Mental Status Exam:  Appearance:   NA     Behavior:  Appropriate, Sharing, and Motivated  Motor:  NA  Speech/Language:   Clear and Coherent and Normal Rate  Affect:  NA  Mood:  normal  Thought process:  normal  Thought content:    WNL  Sensory/Perceptual disturbances:    WNL  Orientation:  oriented to person, place, time/date, situation, and day of week  Attention:  Good  Concentration:  Good  Memory:  WNL  Fund of knowledge:   Good  Insight:    Fair  Judgment:   Fair  Impulse Control:  Fair   Risk Assessment: Danger to Self:  No Self-injurious Behavior: No Danger to Others: No Duty to Warn:no Physical Aggression / Violence:No  Access to Firearms a concern: No  Gang Involvement:No   Subjective: Patient reports ups and downs due to adjusting to new full-time work schedule and balancing parenting responsibilities. Patient reports using daily positive affirmations and taking space to manage emotions, when needed.   Interventions: Mindfulness Meditation, Client Centered  LCSW established psychological safety. LCSW met with patient to identify needs related to stressors and functioning, and assess and monitor for signs and symptoms of anxiety, and assess safety. Checked in with patient regarding how they have been doing since the last follow-up session. LCSW assisted patient in processing their thoughts and emotions regarding challenges with adjusting to balancing working full-time and parenting. LCSW  and patient explored ways to increase self-care through use of mindfulness moments, single tasking, grounding, setting a routine, decreasing phone use and sleep hygiene. Provided support through active listening, validation of feelings, and highlighted patient's strengths.  Diagnosis:   ICD-10-CM   1. Adjustment disorder with anxious mood  F43.22      Plan: Patient identified her goals for treatment as learning how to make decisions for herself and stand by them without being influenced by others or made to feel guilty. She expressed a desire to advocate for herself more effectively and to handle challenging situations without resorting to anger. She wants to be able to walk away from conflict and manage herself in a calm and positive manner.   Objective 1: Patient will identify and challenge at least 3 unhelpful thinking patterns (e.g., guilt, self-doubt, fear of judgment) related to decision-making using CBT techniques over the next 4 sessions. Interventions: Use CBT worksheets to track and reframe cognitive distortions. Practice guided role-plays in session focused on asserting decisions. Assign between-session exercises to apply decision-making in real-life situations.   Objective 2: Patient will demonstrate use of at least 2 emotion regulation strategies (e.g., mindfulness, distress tolerance, self-soothing) in response to interpersonal conflict or emotional triggers in 3 out of 4 reported instances over the next 6 sessions. Interventions: Teach emotion regulation, self-soothing and distress tolerance skills. Process emotionally triggering interpersonal situations in session. Encourage use of emotion log or diary card to track emotional triggers and coping responses.   Objective 3: Patient will identify and articulate her top 3 personal values (ACT framework) and describe how those values guide  at least 2 recent decisions. Interventions: Use ACT values clarification  exercises. Facilitate discussion on value-driven behavior vs. emotion-driven behavior. Reinforce self-affirming language and self-compassion.   Objective 4:  Patient will demonstrate the ability to use cognitive defusion techniques to reduce the intensity of distressing thoughts, as measured by self-report and clinician observation.   Future Appointments  Date Time Provider Department Center  01/22/2024  9:40 AM Argentina Clap, MD [facility] None  01/23/2024  2:00 PM Ellender Palma, LCSW [facility] None    Palma Ellender, LCSW

## 2024-01-16 NOTE — Progress Notes (Deleted)
  Cardiology Office Note   Date:  01/16/2024  ID:  Sarah Reese, DOB 1985-07-13, MRN [id number] PCP: [facility]  [facility] Providers Cardiologist:  None { Click to update primary MD,subspecialty MD or APP then REFRESH:1}    History of Present Illness Sarah Reese is a 38 y.o. female no relevant PMH who presents for further evaluation management of a heart murmur.  Patient was recently seen by PCP for this issue.  PCP notes state that patient has been told she has a murmur in the past, though no murmur was auscultated at appointment.  Relevant CVD History -None   ROS: Pt denies any chest discomfort, jaw pain, arm pain, palpitations, syncope, presyncope, orthopnea, PND, or LE edema.  Studies Reviewed I have independently reviewed the patient's ECG, previous medical records.  Physical Exam VS:  There were no vitals taken for this visit.       Wt Readings from Last 3 Encounters:  09/09/22 208 lb (94.3 kg)  09/07/22 207 lb 1.6 oz (93.9 kg)  08/24/22 208 lb (94.3 kg)    GEN: No acute distress. NECK: No JVD; No carotid bruits. CARDIAC: ***RRR, no murmurs, rubs, gallops. RESPIRATORY:  Clear to auscultation. EXTREMITIES:  Warm and well-perfused. No edema.  ASSESSMENT AND PLAN Reported heart murmur ***        {Are you ordering a CV Procedure (e.g. stress test, cath, DCCV, TEE, etc)?   Press F2        :[id number]}  Dispo: ***  Signed, Caron Poser, MD

## 2024-01-22 ENCOUNTER — Ambulatory Visit

## 2024-01-23 ENCOUNTER — Ambulatory Visit: Admitting: Licensed Clinical Social Worker

## 2024-01-23 DIAGNOSIS — F4322 Adjustment disorder with anxiety: Secondary | ICD-10-CM | POA: Diagnosis not present

## 2024-01-23 NOTE — Progress Notes (Signed)
 Counselor/Therapist Progress Note  Patient ID: Sarah Reese, MRN: [id number],    Date: 01/23/2024  Time Spent: 35 total minutes. I spent 25 minutes on real-time audio with the patient on the date of service. I spent an additional 10 minutes on pre- and post-visit activities on the date of service including collateral, chart review, team discussion, and documentation.   Patient was not able to complete the entire session due to scheduling conflict with work.  Treatment Type: Individual Therapy  Reported Symptoms: Mild anxiety, anxious thoughts   Mental Status Exam:  Appearance:   NA     Behavior:  Appropriate and Sharing  Motor:  NA  Speech/Language:   Clear and Coherent and Normal Rate  Affect:  NA  Mood:  normal  Thought process:  normal  Thought content:    WNL  Sensory/Perceptual disturbances:    WNL  Orientation:  oriented to person, place, time/date, situation, and day of week  Attention:  Good  Concentration:  Good  Memory:  WNL  Fund of knowledge:   Good  Insight:    Fair  Judgment:   Fair  Impulse Control:  Fair   Risk Assessment: Danger to Self:  No Self-injurious Behavior: No Danger to Others: No Duty to Warn:no Physical Aggression / Violence:No  Access to Firearms a concern: No  Gang Involvement:No   Subjective: Patient reports that "she doesn't even know where to start." She endorses significant stress related to financial strain and ongoing interactions with her ex-partner/co-parent. She utilized the session to verbally process thoughts and emotions, expressing a need for space to ventilate and gain clarity.  Interventions: Client Centered, ACT LCSW established psychological safety and briefly assessed patient's mood, safety, and needs related to current stressors and functioning. The patient and LCSW processed thoughts and emotions regarding significant stressors, specifically financial challenges and the ongoing relationship with her ex-partner.  LCSW utilized reflective responses to enhance the patient's coherence and insight into the relationship dynamics, and provided assistance in identifying, setting, and maintaining boundaries that are align with her personal values, thus working toward improved coping and stability.  Diagnosis:   ICD-10-CM   1. Adjustment disorder with anxious mood  F43.22      Plan: Patient identified her goals for treatment as learning how to make decisions for herself and stand by them without being influenced by others or made to feel guilty. She expressed a desire to advocate for herself more effectively and to handle challenging situations without resorting to anger. She wants to be able to walk away from conflict and manage herself in a calm and positive manner.   Objective 1: Patient will identify and challenge at least 3 unhelpful thinking patterns (e.g., guilt, self-doubt, fear of judgment) related to decision-making using CBT techniques over the next 4 sessions. Interventions: Use CBT worksheets to track and reframe cognitive distortions. Practice guided role-plays in session focused on asserting decisions. Assign between-session exercises to apply decision-making in real-life situations.   Objective 2: Patient will demonstrate use of at least 2 emotion regulation strategies (e.g., mindfulness, distress tolerance, self-soothing) in response to interpersonal conflict or emotional triggers in 3 out of 4 reported instances over the next 6 sessions. Interventions: Teach emotion regulation, self-soothing and distress tolerance skills. Process emotionally triggering interpersonal situations in session. Encourage use of emotion log or diary card to track emotional triggers and coping responses.   Objective 3: Patient will identify and articulate her top 3 personal values (ACT framework) and describe how those values  guide at least 2 recent decisions. Interventions: Use ACT values clarification  exercises. Facilitate discussion on value-driven behavior vs. emotion-driven behavior. Reinforce self-affirming language and self-compassion.   Objective 4:  Patient will demonstrate the ability to use cognitive defusion techniques to reduce the intensity of distressing thoughts, as measured by self-report and clinician observation.  Future Appointments  Date Time Provider Department Center  02/06/2024  2:00 PM Ellender Palma, LCSW [facility] None    Palma Ellender, [place]

## 2024-02-06 ENCOUNTER — Ambulatory Visit: Admitting: Licensed Clinical Social Worker

## 2024-02-06 DIAGNOSIS — F4322 Adjustment disorder with anxiety: Secondary | ICD-10-CM | POA: Diagnosis not present

## 2024-02-06 NOTE — Progress Notes (Signed)
 Counselor/Therapist Progress Note  Patient ID: Sarah Reese, MRN: [id number],    Date: 02/06/2024  Time Spent: 40 total minutes. I spent 30 minutes on real-time audio with the patient on the date of service. I spent an additional 10 minutes on pre- and post-visit activities on the date of service including collateral, chart review, team discussion, and documentation.    Treatment Type: Individual Therapy  Reported Symptoms: Mild anxiety, anxious thoughts, worries   Mental Status Exam:  Appearance:   NA     Behavior:  Appropriate and Sharing  Motor:  NA  Speech/Language:   Clear and Coherent and Normal Rate  Affect:  NA  Mood:  normal  Thought process:  normal  Thought content:    WNL  Sensory/Perceptual disturbances:    WNL  Orientation:  oriented to person, place, time/date, situation, and day of week  Attention:  Fair  Concentration:  Fair  Memory:  WNL  Fund of knowledge:   Good  Insight:    Fair  Judgment:   Fair  Impulse Control:  Fair   Risk Assessment: Danger to Self:  No Self-injurious Behavior: No Danger to Others: No Duty to Warn:no Physical Aggression / Violence:No  Access to Firearms a concern: No  Gang Involvement:No   Subjective: Patient reports she is having a lot of emotions, excited, and anxious about upcoming trip regarding her surrogacy. Patient was engaged and participated throughout the session discussing thoughts and feelings. She utilized the session to verbally process thoughts and emotions, expressing a need for space to ventilate and gain clarity.   Interventions: Cognitive Behavioral Therapy, Mindfulness Meditation, and Client centered  LCSW established psychological safety. Met with the patient to assess current needs related to psychosocial stressors and daily functioning. Monitored for symptoms of anxiety and assessed for safety; no imminent safety concerns were identified. Conducted a follow-up check-in regarding the patients  overall well-being since the previous session.  LCSW assisted the patient in processing thoughts and emotions related to the surrogacy process and ongoing challenges within her relationship with her ex-partner. LCSW validated the patients mixed emotions, including excitement and anxiety, surrounding upcoming travel and the future transition into the surrogate role. Interventions included exploration of the patients perceptions and a review of the importance of remaining present-focused to help manage anxious thoughts and unhelpful cognitive patterns.  LCSW further explored the patients perceptions of relational challenges with her ex-partner, identified unhelpful thought patterns contributing to emotional distress, and supported cognitive reframing. LCSW encouraged the patient to continue establishing and maintaining boundaries that align with her values and emotional well-being.  Notified patient of LCSW's paid time off (PTO).   Diagnosis:   ICD-10-CM   1. Adjustment disorder with anxious mood  F43.22      Plan: Patient identified her goals for treatment as learning how to make decisions for herself and stand by them without being influenced by others or made to feel guilty. She expressed a desire to advocate for herself more effectively and to handle challenging situations without resorting to anger. She wants to be able to walk away from conflict and manage herself in a calm and positive manner.   Objective 1: Patient will identify and challenge at least 3 unhelpful thinking patterns (e.g., guilt, self-doubt, fear of judgment) related to decision-making using CBT techniques over the next 4 sessions. Interventions: Use CBT worksheets to track and reframe cognitive distortions. Practice guided role-plays in session focused on asserting decisions. Assign between-session exercises to apply decision-making in real-life situations.  Objective 2: Patient will demonstrate use of at least 2  emotion regulation strategies (e.g., mindfulness, distress tolerance, self-soothing) in response to interpersonal conflict or emotional triggers in 3 out of 4 reported instances over the next 6 sessions. Interventions: Teach emotion regulation, self-soothing and distress tolerance skills. Process emotionally triggering interpersonal situations in session. Encourage use of emotion log or diary card to track emotional triggers and coping responses.   Objective 3: Patient will identify and articulate her top 3 personal values (ACT framework) and describe how those values guide at least 2 recent decisions. Interventions: Use ACT values clarification exercises. Facilitate discussion on value-driven behavior vs. emotion-driven behavior. Reinforce self-affirming language and self-compassion.   Objective 4:  Patient will demonstrate the ability to use cognitive defusion techniques to reduce the intensity of distressing thoughts, as measured by self-report and clinician observation.  Future Appointments  Date Time Provider Department Center  02/27/2024  4:30 PM Ellender Palma, LCSW [facility] None    Palma Ellender, [place]

## 2024-02-27 ENCOUNTER — Ambulatory Visit: Admitting: Licensed Clinical Social Worker

## 2024-02-27 DIAGNOSIS — F4322 Adjustment disorder with anxiety: Secondary | ICD-10-CM

## 2024-02-27 NOTE — Progress Notes (Signed)
 Counselor/Therapist Progress Note  Patient ID: Sarah Reese, MRN: [id number],    Date: 02/27/2024  Time Spent: 45 total minutes. I spent 35 minutes on real-time audio with the patient on the date of service. I spent an additional 10 minutes on pre- and post-visit activities on the date of service including collateral, chart review, team discussion, and documentation.   Patient arrived late and needed to end session early.    Treatment Type: Individual Therapy  Reported Symptoms: Anxiety, overwhelm, stress  Mental Status Exam:  Appearance:   NA     Behavior:  Appropriate and Sharing  Motor:  NA  Speech/Language:   Clear and Coherent and Normal Rate  Affect:  NA  Mood:  normal  Thought process:  normal  Thought content:    WNL  Sensory/Perceptual disturbances:    WNL  Orientation:  oriented to person, place, time/date, situation, and day of week  Attention:  Good  Concentration:  Good  Memory:  WNL  Fund of knowledge:   Good  Insight:    Fair  Judgment:   Fair  Impulse Control:  Fair   Risk Assessment: Danger to Self:  No Self-injurious Behavior: No Danger to Others: No Duty to Warn:no Physical Aggression / Violence:No  Access to Firearms a concern: No  Gang Involvement:No   Subjective: Patient reports she has felt extremely stressed out she reports that she is struggling with caring for herself and caring for her toddler and feels like she doesn't have enough support.  Although patient voices significant stress, she also reports that she has been managing things well.   Interventions: Cognitive Behavioral Therapy, Dialectical Behavioral Therapy, and Mindfulness Meditation LCSW established psychological safety. Met with patient to assess current needs related to stressors and daily functioning. Monitored for symptoms of anxiety and mood assessed for safety. Conducted a follow-up check-in on the patient's well-being since the last session. LCSW assisted patient  in processing their thoughts and emotions regarding multiple stressors, including lack of support with parenting, parenting challenges, and challenges with surrogacy. Reviewed self-soothing with the 5 senses, intentional breathing, turning the mind, and gratitude practices. Discussed progress and termination. LCSW provided support through active listening, validation of feelings, and highlighted patient's strengths.  Diagnosis:   ICD-10-CM   1. Adjustment disorder with anxious mood  F43.22      Plan: Patient identified her goals for treatment as learning how to make decisions for herself and stand by them without being influenced by others or made to feel guilty. She expressed a desire to advocate for herself more effectively and to handle challenging situations without resorting to anger. She wants to be able to walk away from conflict and manage herself in a calm and positive manner.   Objective 1: Patient will identify and challenge at least 3 unhelpful thinking patterns (e.g., guilt, self-doubt, fear of judgment) related to decision-making using CBT techniques over the next 4 sessions. Interventions: Use CBT worksheets to track and reframe cognitive distortions. Practice guided role-plays in session focused on asserting decisions. Assign between-session exercises to apply decision-making in real-life situations.   Objective 2: Patient will demonstrate use of at least 2 emotion regulation strategies (e.g., mindfulness, distress tolerance, self-soothing) in response to interpersonal conflict or emotional triggers in 3 out of 4 reported instances over the next 6 sessions. Interventions: Teach emotion regulation, self-soothing and distress tolerance skills. Process emotionally triggering interpersonal situations in session. Encourage use of emotion log or diary card to track emotional triggers and coping responses.  Objective 3: Patient will identify and articulate her top 3 personal values  (ACT framework) and describe how those values guide at least 2 recent decisions.  Interventions: Use ACT values clarification exercises. Facilitate discussion on value-driven behavior vs. emotion-driven behavior. Reinforce self-affirming language and self-compassion.   Objective 4:  Patient will demonstrate the ability to use cognitive defusion techniques to reduce the intensity of distressing thoughts, as measured by self-report and clinician observation.  Future Appointments  Date Time Provider Department Center  03/12/2024  4:30 PM Ellender Palma, LCSW [facility] None     Palma Ellender, [place]

## 2024-03-12 ENCOUNTER — Ambulatory Visit: Admitting: Licensed Clinical Social Worker

## 2024-03-12 DIAGNOSIS — F4322 Adjustment disorder with anxiety: Secondary | ICD-10-CM

## 2024-03-12 NOTE — Progress Notes (Signed)
 Counselor/Therapist Progress Note  Patient ID: Sarah Reese, MRN: [id number],    Date: 03/12/2024  Time Spent: 45 total minutes. I spent 35 minutes on real-time audio with the patient on the date of service. I spent an additional 10 minutes on pre- and post-visit activities on the date of service including collateral, chart review, team discussion, and documentation.   Treatment Type: Individual Therapy  Reported Symptoms: Overall mood stability   Mental Status Exam:  Appearance:   NA     Behavior:  Appropriate, Sharing, and Motivated  Motor:  NA  Speech/Language:   Clear and Coherent and Normal Rate  Affect:  NA  Mood:  normal  Thought process:  normal  Thought content:    WNL  Sensory/Perceptual disturbances:    WNL  Orientation:  oriented to person, place, time/date, situation, and day of week  Attention:  Good  Concentration:  Good  Memory:  WNL  Fund of knowledge:   Good  Insight:    Fair  Judgment:   Fair  Impulse Control:  Fair   Risk Assessment: Danger to Self:  No Self-injurious Behavior: No Danger to Others: No Duty to Warn:no Physical Aggression / Violence:No  Access to Firearms a concern: No  Gang Involvement:No   Subjective: Patient reports experiencing both positive and negative moments recently. She was engaged and cooperative throughout the session and openly shared her thoughts and feelings, and was receptive to feedback and intervention.  Interventions: Cognitive Behavioral Therapy and Client Centered  LCSW established psychological safety. LCSW met with patient to identify needs related to stressors and functioning, and assess and monitor for signs and symptoms of anxiety and depressed mood, and assess safety. Checked in with patient regarding how they have been doing since the last follow-up session. LCSW supported the patient in processing thoughts and emotions related to ongoing stress associated with her relationship with her ex-partner.  Explored the mixed emotional response, including both relief and distress, connected to this dynamic. Provided psychoeducation on the importance of establishing and maintaining firm boundaries to support emotional well-being. LCSW facilitated CBT-based affect regulation strategies, including intentional deep breathing and grounding using the five senses, to manage heightened emotional responses. Patient demonstrated insight and verbalized understanding of how these techniques can help reduce current stress levels.  Diagnosis:   ICD-10-CM   1. Adjustment disorder with anxious mood  F43.22      Plan: Patient identified her goals for treatment as learning how to make decisions for herself and stand by them without being influenced by others or made to feel guilty. She expressed a desire to advocate for herself more effectively and to handle challenging situations without resorting to anger. She wants to be able to walk away from conflict and manage herself in a calm and positive manner.   Objective 1: Patient will identify and challenge at least 3 unhelpful thinking patterns (e.g., guilt, self-doubt, fear of judgment) related to decision-making using CBT techniques over the next 4 sessions. Interventions: Use CBT worksheets to track and reframe cognitive distortions. Practice guided role-plays in session focused on asserting decisions. Assign between-session exercises to apply decision-making in real-life situations.   Objective 2: Patient will demonstrate use of at least 2 emotion regulation strategies (e.g., mindfulness, distress tolerance, self-soothing) in response to interpersonal conflict or emotional triggers in 3 out of 4 reported instances over the next 6 sessions. Interventions: Teach emotion regulation, self-soothing and distress tolerance skills. Process emotionally triggering interpersonal situations in session. Encourage use of emotion log  or diary card to track emotional triggers and  coping responses.   Objective 3: Patient will identify and articulate her top 3 personal values (ACT framework) and describe how those values guide at least 2 recent decisions.  Interventions: Use ACT values clarification exercises. Facilitate discussion on value-driven behavior vs. emotion-driven behavior. Reinforce self-affirming language and self-compassion.   Objective 4:  Patient will demonstrate the ability to use cognitive defusion techniques to reduce the intensity of distressing thoughts, as measured by self-report and clinician observation.  Future Appointments  Date Time Provider Department Center  03/25/2024  4:30 PM Ellender Palma, LCSW [facility] None    [place], [place]

## 2024-03-25 ENCOUNTER — Ambulatory Visit: Payer: Self-pay | Admitting: Licensed Clinical Social Worker

## 2024-03-25 DIAGNOSIS — F4322 Adjustment disorder with anxiety: Secondary | ICD-10-CM

## 2024-04-08 ENCOUNTER — Ambulatory Visit: Payer: Self-pay | Admitting: Licensed Clinical Social Worker
